# Patient Record
Sex: Female | Born: 1985 | Race: White | Hispanic: No | Marital: Married | Smoking: Current some day smoker
Health system: Southern US, Community
[De-identification: ages and names within clinical notes are randomized; demographics above are authoritative.]

## PROBLEM LIST (undated history)

## (undated) DIAGNOSIS — M810 Age-related osteoporosis without current pathological fracture: Secondary | ICD-10-CM

## (undated) DIAGNOSIS — F319 Bipolar disorder, unspecified: Secondary | ICD-10-CM

## (undated) DIAGNOSIS — M1990 Unspecified osteoarthritis, unspecified site: Secondary | ICD-10-CM

## (undated) DIAGNOSIS — F4312 Post-traumatic stress disorder, chronic: Secondary | ICD-10-CM

## (undated) DIAGNOSIS — F419 Anxiety disorder, unspecified: Secondary | ICD-10-CM

## (undated) DIAGNOSIS — G43909 Migraine, unspecified, not intractable, without status migrainosus: Secondary | ICD-10-CM

## (undated) DIAGNOSIS — F32A Depression, unspecified: Secondary | ICD-10-CM

## (undated) DIAGNOSIS — F988 Other specified behavioral and emotional disorders with onset usually occurring in childhood and adolescence: Secondary | ICD-10-CM

## (undated) DIAGNOSIS — T7840XA Allergy, unspecified, initial encounter: Secondary | ICD-10-CM

## (undated) HISTORY — DX: Depression, unspecified: F32.A

## (undated) HISTORY — DX: Age-related osteoporosis without current pathological fracture: M81.0

## (undated) HISTORY — DX: Anxiety disorder, unspecified: F41.9

## (undated) HISTORY — PX: MOUTH SURGERY: SHX715

## (undated) HISTORY — DX: Migraine, unspecified, not intractable, without status migrainosus: G43.909

## (undated) HISTORY — DX: Allergy, unspecified, initial encounter: T78.40XA

## (undated) HISTORY — DX: Unspecified osteoarthritis, unspecified site: M19.90

## (undated) HISTORY — DX: Bipolar disorder, unspecified: F31.9

## (undated) HISTORY — DX: Other specified behavioral and emotional disorders with onset usually occurring in childhood and adolescence: F98.8

## (undated) HISTORY — DX: Post-traumatic stress disorder, chronic: F43.12

---
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
## 2005-03-28 ENCOUNTER — Emergency Department: Payer: Self-pay | Admitting: Emergency Medicine

## 2006-02-17 ENCOUNTER — Ambulatory Visit: Payer: Self-pay | Admitting: Obstetrics and Gynecology

## 2006-02-18 ENCOUNTER — Ambulatory Visit: Payer: Self-pay | Admitting: Obstetrics and Gynecology

## 2006-03-13 ENCOUNTER — Emergency Department: Payer: Self-pay | Admitting: Internal Medicine

## 2006-04-27 ENCOUNTER — Ambulatory Visit: Payer: Self-pay | Admitting: Obstetrics and Gynecology

## 2006-07-04 ENCOUNTER — Ambulatory Visit: Payer: Self-pay | Admitting: Obstetrics and Gynecology

## 2006-08-18 ENCOUNTER — Ambulatory Visit: Payer: Self-pay | Admitting: Obstetrics and Gynecology

## 2006-09-01 ENCOUNTER — Ambulatory Visit: Payer: Self-pay | Admitting: Obstetrics and Gynecology

## 2006-09-08 ENCOUNTER — Ambulatory Visit: Payer: Self-pay | Admitting: Obstetrics and Gynecology

## 2006-09-11 ENCOUNTER — Observation Stay: Payer: Self-pay | Admitting: Obstetrics and Gynecology

## 2006-09-14 ENCOUNTER — Observation Stay: Payer: Self-pay | Admitting: Obstetrics and Gynecology

## 2006-09-15 ENCOUNTER — Ambulatory Visit: Payer: Self-pay | Admitting: Obstetrics and Gynecology

## 2006-09-19 ENCOUNTER — Observation Stay: Payer: Self-pay | Admitting: Obstetrics and Gynecology

## 2006-09-22 ENCOUNTER — Inpatient Hospital Stay: Payer: Self-pay | Admitting: Obstetrics and Gynecology

## 2007-01-29 ENCOUNTER — Emergency Department: Payer: Self-pay | Admitting: Emergency Medicine

## 2007-04-04 ENCOUNTER — Emergency Department: Payer: Self-pay | Admitting: Unknown Physician Specialty

## 2007-04-20 ENCOUNTER — Ambulatory Visit: Payer: Self-pay | Admitting: Urology

## 2007-04-26 ENCOUNTER — Ambulatory Visit: Payer: Self-pay | Admitting: Obstetrics and Gynecology

## 2007-05-13 ENCOUNTER — Emergency Department: Payer: Self-pay | Admitting: Emergency Medicine

## 2008-01-19 ENCOUNTER — Ambulatory Visit: Payer: Self-pay

## 2008-03-15 ENCOUNTER — Ambulatory Visit: Payer: Self-pay | Admitting: Family Medicine

## 2008-03-19 ENCOUNTER — Emergency Department: Payer: Self-pay | Admitting: Emergency Medicine

## 2008-08-07 ENCOUNTER — Ambulatory Visit: Payer: Self-pay

## 2008-08-12 ENCOUNTER — Ambulatory Visit: Payer: Self-pay

## 2009-01-03 ENCOUNTER — Emergency Department: Payer: Self-pay | Admitting: Emergency Medicine

## 2009-02-28 ENCOUNTER — Emergency Department: Payer: Self-pay | Admitting: Emergency Medicine

## 2009-03-05 ENCOUNTER — Ambulatory Visit: Payer: Self-pay | Admitting: Internal Medicine

## 2009-04-24 ENCOUNTER — Inpatient Hospital Stay: Payer: Self-pay | Admitting: Psychiatry

## 2009-05-20 ENCOUNTER — Other Ambulatory Visit: Payer: Self-pay | Admitting: Psychiatry

## 2009-05-26 ENCOUNTER — Other Ambulatory Visit: Payer: Self-pay | Admitting: Psychiatry

## 2009-06-05 ENCOUNTER — Inpatient Hospital Stay: Payer: Self-pay | Admitting: Psychiatry

## 2009-07-21 ENCOUNTER — Encounter: Payer: Self-pay | Admitting: Family Medicine

## 2009-07-25 ENCOUNTER — Other Ambulatory Visit: Payer: Self-pay | Admitting: Psychiatry

## 2009-08-05 ENCOUNTER — Ambulatory Visit: Payer: Self-pay | Admitting: Family Medicine

## 2009-08-12 ENCOUNTER — Other Ambulatory Visit: Payer: Self-pay | Admitting: Family Medicine

## 2009-08-15 ENCOUNTER — Encounter: Payer: Self-pay | Admitting: Family Medicine

## 2009-08-28 ENCOUNTER — Other Ambulatory Visit: Payer: Self-pay | Admitting: Family Medicine

## 2009-08-28 ENCOUNTER — Other Ambulatory Visit: Payer: Self-pay | Admitting: Psychiatry

## 2009-09-03 ENCOUNTER — Emergency Department: Payer: Self-pay | Admitting: Emergency Medicine

## 2009-09-12 ENCOUNTER — Other Ambulatory Visit: Payer: Self-pay

## 2009-11-24 ENCOUNTER — Other Ambulatory Visit: Payer: Self-pay | Admitting: Family Medicine

## 2010-01-12 ENCOUNTER — Emergency Department: Payer: Self-pay | Admitting: Emergency Medicine

## 2010-01-21 ENCOUNTER — Emergency Department: Payer: Self-pay | Admitting: Emergency Medicine

## 2010-03-12 ENCOUNTER — Encounter: Payer: Self-pay | Admitting: General Practice

## 2010-03-17 ENCOUNTER — Encounter: Payer: Self-pay | Admitting: General Practice

## 2010-03-19 ENCOUNTER — Ambulatory Visit: Payer: Self-pay | Admitting: General Practice

## 2010-04-16 ENCOUNTER — Encounter: Payer: Self-pay | Admitting: General Practice

## 2010-08-06 ENCOUNTER — Encounter: Payer: Self-pay | Admitting: General Practice

## 2010-08-16 ENCOUNTER — Encounter: Payer: Self-pay | Admitting: General Practice

## 2010-09-15 ENCOUNTER — Encounter: Payer: Self-pay | Admitting: General Practice

## 2011-04-01 ENCOUNTER — Emergency Department: Payer: Self-pay | Admitting: *Deleted

## 2011-04-06 ENCOUNTER — Ambulatory Visit: Payer: Self-pay | Admitting: General Practice

## 2011-04-21 ENCOUNTER — Encounter: Payer: Self-pay | Admitting: Specialist

## 2011-04-27 ENCOUNTER — Other Ambulatory Visit: Payer: Self-pay | Admitting: Psychiatry

## 2011-05-18 ENCOUNTER — Encounter: Payer: Self-pay | Admitting: Specialist

## 2011-06-18 ENCOUNTER — Encounter: Payer: Self-pay | Admitting: Specialist

## 2011-11-02 ENCOUNTER — Ambulatory Visit: Payer: Self-pay | Admitting: Family Medicine

## 2011-11-02 LAB — HCG, QUANTITATIVE, PREGNANCY: Beta Hcg, Quant.: <1 m[IU]/mL — ABNORMAL LOW

## 2013-02-07 ENCOUNTER — Ambulatory Visit: Payer: Self-pay | Admitting: Family Medicine

## 2013-04-19 ENCOUNTER — Emergency Department: Payer: Self-pay | Admitting: Internal Medicine

## 2014-10-23 DIAGNOSIS — R0602 Shortness of breath: Secondary | ICD-10-CM | POA: Insufficient documentation

## 2014-10-23 DIAGNOSIS — F419 Anxiety disorder, unspecified: Secondary | ICD-10-CM | POA: Insufficient documentation

## 2014-10-23 DIAGNOSIS — F319 Bipolar disorder, unspecified: Secondary | ICD-10-CM | POA: Insufficient documentation

## 2014-10-23 DIAGNOSIS — F4312 Post-traumatic stress disorder, chronic: Secondary | ICD-10-CM | POA: Insufficient documentation

## 2014-10-23 DIAGNOSIS — Z87898 Personal history of other specified conditions: Secondary | ICD-10-CM | POA: Insufficient documentation

## 2014-10-23 DIAGNOSIS — F17200 Nicotine dependence, unspecified, uncomplicated: Secondary | ICD-10-CM | POA: Insufficient documentation

## 2014-10-23 DIAGNOSIS — J309 Allergic rhinitis, unspecified: Secondary | ICD-10-CM | POA: Insufficient documentation

## 2014-10-23 DIAGNOSIS — G43909 Migraine, unspecified, not intractable, without status migrainosus: Secondary | ICD-10-CM | POA: Insufficient documentation

## 2014-10-24 ENCOUNTER — Ambulatory Visit (INDEPENDENT_AMBULATORY_CARE_PROVIDER_SITE_OTHER): Payer: 59 | Admitting: Family Medicine

## 2014-10-24 ENCOUNTER — Encounter: Payer: Self-pay | Admitting: Family Medicine

## 2014-10-24 VITALS — BP 124/78 | HR 119 | Temp 98.9°F | Resp 16 | Ht 66.5 in | Wt 162.8 lb

## 2014-10-24 DIAGNOSIS — J301 Allergic rhinitis due to pollen: Secondary | ICD-10-CM

## 2014-10-24 DIAGNOSIS — J069 Acute upper respiratory infection, unspecified: Secondary | ICD-10-CM | POA: Diagnosis not present

## 2014-10-24 MED ORDER — AZITHROMYCIN 250 MG PO TABS: ORAL_TABLET | ORAL | Status: DC

## 2014-10-24 NOTE — Patient Instructions (Addendum)
Add Mucinex and continue Delsym. Resume Flonase for allergy symptoms

## 2014-10-24 NOTE — Progress Notes (Signed)
Subjective:     Patient ID: Ashley Suarez, female   DOB: [birth year], 29 y.o.   MRN: [id number]  HPI Chief Complaint  Patient presents with  . Cough    patient comes in office today with concerns and complaints of cough, sore throat and post nasal drainage. Patient states that she has been exposed to strep by family memer and is requesting strep test today.   Reports that sore throat and itchy eyes have present for a week but cough has persisted since being seen for a URI in May. Has been taking Claritin and otc cough medication for her sx.  Review of Systems  HENT: Positive for congestion (clear drainage).   Respiratory: Positive for cough (sputum is described as off white).        Objective:   Physical Exam Ears: T.M's intact without inflammation Throat: no tonsillar enlargement or exudate Neck: no cervical adenopathy Lungs: coarse basilar crackles     Assessment:     1. Upper respiratory infection (suspect bronchitis)  - POCT rapid strep A - azithromycin (ZITHROMAX) 250 MG tablet; 2 pills the first day then one pill daily  Dispense: 6 tablet; Refill: 0  2. Allergic rhinitis due to pollen     Plan:     Resume Flonase spray she has at home and continue current otc medication.

## 2014-12-23 ENCOUNTER — Other Ambulatory Visit: Payer: Self-pay | Admitting: Family Medicine

## 2014-12-23 DIAGNOSIS — F419 Anxiety disorder, unspecified: Secondary | ICD-10-CM

## 2014-12-23 MED ORDER — ALPRAZOLAM 1 MG PO TABS: ORAL_TABLET | ORAL | Status: DC

## 2014-12-23 NOTE — Telephone Encounter (Signed)
ALPRAZolam Duanne Moron) 1 MG tablet [facility] pharmacy  Thanks Con Memos

## 2014-12-23 NOTE — Telephone Encounter (Signed)
Ok to call in refill. Thanks.

## 2015-01-27 ENCOUNTER — Other Ambulatory Visit: Payer: Self-pay | Admitting: Family Medicine

## 2015-01-27 DIAGNOSIS — F419 Anxiety disorder, unspecified: Secondary | ICD-10-CM

## 2015-01-27 MED ORDER — ALPRAZOLAM 1 MG PO TABS: ORAL_TABLET | ORAL | Status: DC

## 2015-01-27 NOTE — Telephone Encounter (Signed)
Patient's last refill was on 12/23/14 #90 with 0 RF. Patient's LOV was 10/24/14.

## 2015-01-27 NOTE — Telephone Encounter (Signed)
Prescription called into pharmacy. sd

## 2015-01-27 NOTE — Telephone Encounter (Signed)
Pt contacted office for refill request on the following medications:  ALPRAZolam (XANAX) 1 MG tablet.  [facility].  TR#[phone]/[id number]

## 2015-01-27 NOTE — Telephone Encounter (Signed)
Please verify and pull down appropriate refill. Thanks.

## 2015-01-27 NOTE — Telephone Encounter (Signed)
See refill request.

## 2015-01-27 NOTE — Telephone Encounter (Signed)
Please review. Thanks!  

## 2015-01-27 NOTE — Telephone Encounter (Signed)
Ok to call in rx.  Thanks.  

## 2015-02-11 ENCOUNTER — Encounter: Payer: Self-pay | Admitting: Family Medicine

## 2015-02-11 ENCOUNTER — Ambulatory Visit (INDEPENDENT_AMBULATORY_CARE_PROVIDER_SITE_OTHER): Payer: 59 | Admitting: Family Medicine

## 2015-02-11 VITALS — BP 132/82 | HR 120 | Temp 98.2°F | Resp 20 | Ht 66.0 in | Wt 163.0 lb

## 2015-02-11 DIAGNOSIS — J111 Influenza due to unidentified influenza virus with other respiratory manifestations: Secondary | ICD-10-CM | POA: Diagnosis not present

## 2015-02-11 DIAGNOSIS — R05 Cough: Secondary | ICD-10-CM | POA: Diagnosis not present

## 2015-02-11 DIAGNOSIS — R059 Cough, unspecified: Secondary | ICD-10-CM

## 2015-02-11 LAB — POCT INFLUENZA A/B
Influenza A, POC: NEGATIVE
Influenza B, POC: NEGATIVE

## 2015-02-11 MED ORDER — OSELTAMIVIR PHOSPHATE 75 MG PO CAPS: 75.0000 mg | ORAL_CAPSULE | Freq: Two times a day (BID) | ORAL | Status: DC

## 2015-02-11 MED ORDER — HYDROCODONE-HOMATROPINE 5-1.5 MG/5ML PO SYRP: 5.0000 mL | ORAL_SOLUTION | Freq: Four times a day (QID) | ORAL | Status: DC | PRN
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
## 2015-10-15 ENCOUNTER — Ambulatory Visit (INDEPENDENT_AMBULATORY_CARE_PROVIDER_SITE_OTHER): Payer: Commercial Managed Care - HMO | Admitting: Family Medicine

## 2015-10-15 ENCOUNTER — Encounter: Payer: Self-pay | Admitting: Family Medicine

## 2015-10-15 VITALS — BP 136/74 | HR 64 | Temp 98.2°F | Resp 16 | Wt 190.0 lb

## 2015-10-15 DIAGNOSIS — J40 Bronchitis, not specified as acute or chronic: Secondary | ICD-10-CM

## 2015-10-15 MED ORDER — CETIRIZINE HCL 10 MG PO TABS: 10.0000 mg | ORAL_TABLET | Freq: Every day | ORAL | Status: DC

## 2015-10-15 MED ORDER — MONTELUKAST SODIUM 10 MG PO TABS: 10.0000 mg | ORAL_TABLET | Freq: Every day | ORAL | Status: DC
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
## 2015-11-13 ENCOUNTER — Emergency Department
Admission: EM | Admit: 2015-11-13 | Discharge: 2015-11-13 | Disposition: A | Discharge status: Home / Self Care | Payer: Commercial Managed Care - HMO | Attending: Emergency Medicine | Admitting: Emergency Medicine

## 2015-11-13 ENCOUNTER — Emergency Department: Payer: Commercial Managed Care - HMO

## 2015-11-13 DIAGNOSIS — F909 Attention-deficit hyperactivity disorder, unspecified type: Secondary | ICD-10-CM | POA: Insufficient documentation

## 2015-11-13 DIAGNOSIS — R202 Paresthesia of skin: Secondary | ICD-10-CM | POA: Diagnosis not present

## 2015-11-13 DIAGNOSIS — F17210 Nicotine dependence, cigarettes, uncomplicated: Secondary | ICD-10-CM | POA: Diagnosis not present

## 2015-11-13 DIAGNOSIS — F319 Bipolar disorder, unspecified: Secondary | ICD-10-CM | POA: Insufficient documentation

## 2015-11-13 DIAGNOSIS — R0602 Shortness of breath: Secondary | ICD-10-CM | POA: Insufficient documentation

## 2015-11-13 DIAGNOSIS — Z79899 Other long term (current) drug therapy: Secondary | ICD-10-CM | POA: Diagnosis not present

## 2015-11-13 DIAGNOSIS — R0600 Dyspnea, unspecified: Secondary | ICD-10-CM

## 2015-11-13 LAB — BASIC METABOLIC PANEL
Anion gap: 13 (ref 5–15)
BUN: 10 mg/dL (ref 6–20)
CHLORIDE: 97 mmol/L — AB (ref 101–111)
CO2: 24 mmol/L (ref 22–32)
CREATININE: 0.74 mg/dL (ref 0.44–1.00)
Calcium: 9.6 mg/dL (ref 8.9–10.3)
GFR calc non Af Amer: >60 mL/min (ref >60)
GLUCOSE: 103 mg/dL — AB (ref 65–99)
Potassium: 3.3 mmol/L — ABNORMAL LOW (ref 3.5–5.1)
Sodium: 134 mmol/L — ABNORMAL LOW (ref 135–145)

## 2015-11-13 LAB — CBC
HCT: 41.6 % (ref 35.0–47.0)
Hemoglobin: 14.7 g/dL (ref 12.0–16.0)
MCH: 33.2 pg (ref 26.0–34.0)
MCHC: 35.2 g/dL (ref 32.0–36.0)
MCV: 94.3 fL (ref 80.0–100.0)
PLATELETS: 243 10*3/uL (ref 150–440)
RBC: 4.41 MIL/uL (ref 3.80–5.20)
RDW: 12 % (ref 11.5–14.5)
WBC: 12.7 10*3/uL — ABNORMAL HIGH (ref 3.6–11.0)

## 2015-11-13 LAB — TROPONIN I
Troponin I: <0.03 ng/mL (ref <0.03)
Troponin I: <0.03 ng/mL (ref <0.03)

## 2015-11-13 MED ORDER — ASPIRIN 81 MG PO CHEW: 81.0000 mg | CHEWABLE_TABLET | Freq: Every day | ORAL | Status: DC

## 2015-11-13 MED ORDER — ASPIRIN 81 MG PO CHEW
324.0000 mg | CHEWABLE_TABLET | Freq: Once | ORAL | Status: AC
  Administered 2015-11-13: 324 mg via ORAL
  Filled 2015-11-13: qty 4

## 2015-11-13 MED ORDER — CARISOPRODOL 350 MG PO TABS: 350.0000 mg | ORAL_TABLET | Freq: Three times a day (TID) | ORAL | Status: DC | PRN

## 2015-11-13 NOTE — ED Notes (Addendum)
Pt arrives to ER via POV c/o tachycardia, hypertension, left sided jaw pain and pain that radiates down left arm accompanied with tingling; pt able to move left arm, sensation intact, color WNL. Pt alert and oriented X4, active, cooperative, pt in NAD. RR even and unlabored, color WNL.  Pt tachypneic.

## 2015-11-13 NOTE — ED Notes (Signed)
Pt in via triage with complaints of left jaw pain radiating down left arm w/ some numbness and tingling down that arm.  Pt denies and chest pain or sob.  Pt reports hx of anxiety but feels like this is different.  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  Pt A/Ox4, in no immediate distress at this time.
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
## 2015-12-05 ENCOUNTER — Ambulatory Visit (INDEPENDENT_AMBULATORY_CARE_PROVIDER_SITE_OTHER): Payer: Commercial Managed Care - HMO | Admitting: Physician Assistant

## 2015-12-05 ENCOUNTER — Encounter: Payer: Self-pay | Admitting: Physician Assistant

## 2015-12-05 VITALS — BP 140/80 | HR 101 | Temp 98.2°F | Resp 16 | Wt 193.2 lb

## 2015-12-05 DIAGNOSIS — F411 Generalized anxiety disorder: Secondary | ICD-10-CM | POA: Diagnosis not present

## 2015-12-05 DIAGNOSIS — E669 Obesity, unspecified: Secondary | ICD-10-CM

## 2015-12-05 DIAGNOSIS — Z713 Dietary counseling and surveillance: Secondary | ICD-10-CM | POA: Diagnosis not present

## 2015-12-05 DIAGNOSIS — Z6831 Body mass index (BMI) 31.0-31.9, adult: Secondary | ICD-10-CM

## 2015-12-05 MED ORDER — ALPRAZOLAM 1 MG PO TABS: 0.5000 mg | ORAL_TABLET | Freq: Three times a day (TID) | ORAL | Status: DC | PRN

## 2015-12-05 MED ORDER — PHENTERMINE HCL 15 MG PO TBDP: 1.0000 | ORAL_TABLET | Freq: Every day | ORAL | Status: DC

## 2015-12-05 NOTE — Patient Instructions (Signed)

## 2015-12-05 NOTE — Progress Notes (Signed)
Patient: Ashley Suarez Female    DOB: 1986-02-11   30 y.o.   MRN: [id number] Visit Date: 12/05/2015  Today's Provider: Mar Daring, PA-C   Chief Complaint  Patient presents with  . Follow-up   Subjective:    HPI  Ashley Suarez is a 30 year old female that comes in today to discuss 2 issues. First, she would like to discuss her stress and anxiety. Secondly, she would like to discuss her weight. Patient used to see Dr. Venia Minks.  Stress: She has been on xanax and seroquel in the past for stress, anxiety, and bipolar. She went off of these medicines on her own because she didn't want chemicals in her body. Her mother recently passed from lung cancer and she has been dealing with stress at work (switching to a new EMR-she works for [employer]) as well as stress from patient calls (she has been having to deal with a lot of patient frustration due to them switching EMRs). These have caused her anxiety to worsen. She was seen in the ER on 6/29 for chest pain, tingling on left side, and SOB. Cardiac work up was negative and it was felt to be anxiety. She has had a full cardiac workup that was negative due to sudden cardiac arrest in her brother in his 107s that he passed from. She does have a strong family history of CAD.   Weight: As she has become more stressed she has noticed she is eating more. She snacks more at work and has put on approx 20 pounds. This also causes her increased stress and anxiety. She has been exercising and trying to cut portions back. She has still not lost any weight.     Allergies  Allergen Reactions  . Hydrocodone-Acetaminophen Nausea And Vomiting  . Tramadol Nausea And Vomiting   Current Meds  Medication Sig  . Multiple Vitamin (MULTI-VITAMINS) TABS Take by mouth.    Review of Systems  Constitutional: Negative.   Respiratory: Negative.   Cardiovascular: Negative.   Gastrointestinal: Negative.   Musculoskeletal: Negative.   Neurological:  Negative.   Psychiatric/Behavioral: Positive for dysphoric mood. Negative for suicidal ideas, sleep disturbance, self-injury, decreased concentration and agitation. The patient is nervous/anxious.     Social History  Substance Use Topics  . Smoking status: Current Some Day Smoker -- 0.50 packs/day    Types: Cigarettes  . Smokeless tobacco: Never Used  . Alcohol Use: No   Objective:   BP 140/80 mmHg  Pulse 101  Temp(Src) 98.2 F (36.8 C) (Oral)  Resp 16  Wt 193 lb 3.2 oz (87.635 kg)  Physical Exam  Constitutional: She appears well-developed and well-nourished. No distress.  Neck: Normal range of motion. Neck supple. No tracheal deviation present. No thyromegaly present.  Cardiovascular: Normal rate, regular rhythm and normal heart sounds.  Exam reveals no gallop and no friction rub.   No murmur heard. Pulmonary/Chest: Effort normal and breath sounds normal. No respiratory distress. She has no wheezes. She has no rales.  Musculoskeletal: She exhibits no edema.  Lymphadenopathy:    She has no cervical adenopathy.  Skin: She is not diaphoretic.  Psychiatric: Her behavior is normal. Judgment and thought content normal. Her mood appears anxious. She exhibits a depressed mood.  Did become tearful at one point in interview and was very nervous about meeting new provider  Vitals reviewed.     Assessment & Plan:     1. GAD (generalized anxiety disorder) Will  refill xanax as below at the dose she had previously tolerated. I will see her back in 4 weeks to recheck and see how she is doing. Once she is stable we will schedule her CPE with pap. - ALPRAZolam (XANAX) 1 MG tablet; Take 0.5-1 tablets (0.5-1 mg total) by mouth 3 (three) times daily as needed for anxiety.  Dispense: 90 tablet; Refill: 0  2. Encounter for weight loss counseling Will start phentermine 15mg  as below due to anxiety and I do not want to make that worse. She is to start trying to do a food diary and limit to 1200  calories per day for a goal of 1-2 pounds of weight lost per week. She needs to drink plenty of water. She is to call if this worsens her anxiety. I will see her back in 4 weeks for a weight recheck. - Phentermine HCl 15 MG TBDP; Take 1 tablet by mouth daily.  Dispense: 30 tablet; Refill: 0  3. BMI 31.0-31.9,adult See above medical treatment plan. - Phentermine HCl 15 MG TBDP; Take 1 tablet by mouth daily.  Dispense: 30 tablet; Refill: 0  4. Obese See above medical treatment plan. - Phentermine HCl 15 MG TBDP; Take 1 tablet by mouth daily.  Dispense: 30 tablet; Refill: 0       Mar Daring, PA-C  [facility]

## 2015-12-07 ENCOUNTER — Encounter: Payer: Self-pay | Admitting: Physician Assistant

## 2015-12-24 ENCOUNTER — Encounter: Payer: Self-pay | Admitting: Physician Assistant

## 2015-12-24 DIAGNOSIS — Z713 Dietary counseling and surveillance: Secondary | ICD-10-CM

## 2015-12-24 DIAGNOSIS — E669 Obesity, unspecified: Secondary | ICD-10-CM

## 2015-12-24 DIAGNOSIS — Z6831 Body mass index (BMI) 31.0-31.9, adult: Secondary | ICD-10-CM

## 2015-12-24 DIAGNOSIS — F411 Generalized anxiety disorder: Secondary | ICD-10-CM

## 2015-12-25 ENCOUNTER — Telehealth: Payer: Self-pay

## 2015-12-25 MED ORDER — PHENTERMINE HCL 15 MG PO TBDP: 1.0000 | ORAL_TABLET | Freq: Every day | ORAL | 0 refills | Status: DC

## 2015-12-25 MED ORDER — ALPRAZOLAM 1 MG PO TABS: 0.5000 mg | ORAL_TABLET | Freq: Three times a day (TID) | ORAL | 0 refills | Status: DC | PRN

## 2015-12-25 NOTE — Telephone Encounter (Signed)
Prescription for Alprazolam 1 MG and Phentermine HCL 15 MG TBDP was called to [place].  Thanks,  -Joseline

## 2015-12-31 ENCOUNTER — Telehealth: Payer: Self-pay | Admitting: Physician Assistant

## 2015-12-31 NOTE — Telephone Encounter (Signed)
Patient advised as directed below. 

## 2015-12-31 NOTE — Telephone Encounter (Signed)
Pt returned your call. ° °ThanksTeri °

## 2015-12-31 NOTE — Telephone Encounter (Signed)
Pt is asking if the Rx for ALPRAZolam (XANAX) 1 MG tablet and Phentermine HCl 15 MG TBDP were called into the pharmacy.  Pt states [facility] on [address] has not rec'd Rx.  CB#[phone] Ext 113/MW

## 2015-12-31 NOTE — Telephone Encounter (Signed)
LMTCB  Thanks,  -Joseline 

## 2015-12-31 NOTE — Telephone Encounter (Signed)
Spoke with patient and advised that Medication were sent to [facility] on 08/10. Per patient if I can please cancel those and call them to [place]. [place] and pharmacy tech I spoke to stated they are able to cancel the Phentermine but not the Xanax because of Insurance. LMTCB.  Thanks,  -Joseline

## 2016-01-07 ENCOUNTER — Encounter: Payer: Self-pay | Admitting: Physician Assistant

## 2016-01-07 ENCOUNTER — Ambulatory Visit (INDEPENDENT_AMBULATORY_CARE_PROVIDER_SITE_OTHER): Payer: 59 | Admitting: Physician Assistant

## 2016-01-07 VITALS — BP 140/90 | HR 93 | Temp 98.1°F | Resp 16 | Wt 190.8 lb

## 2016-01-07 DIAGNOSIS — E669 Obesity, unspecified: Secondary | ICD-10-CM

## 2016-01-07 DIAGNOSIS — Z713 Dietary counseling and surveillance: Secondary | ICD-10-CM | POA: Diagnosis not present

## 2016-01-07 DIAGNOSIS — Z6830 Body mass index (BMI) 30.0-30.9, adult: Secondary | ICD-10-CM | POA: Diagnosis not present

## 2016-01-07 DIAGNOSIS — F419 Anxiety disorder, unspecified: Secondary | ICD-10-CM

## 2016-01-07 MED ORDER — PHENTERMINE HCL 30 MG PO TBDP: 30.0000 mg | ORAL_TABLET | Freq: Every day | ORAL | 0 refills | Status: DC

## 2016-01-07 MED ORDER — ESCITALOPRAM OXALATE 10 MG PO TABS: ORAL_TABLET | ORAL | 1 refills | Status: DC

## 2016-01-07 NOTE — Progress Notes (Signed)
Patient: Ashley Suarez Female    DOB: 29-Mar-1986   30 y.o.   MRN: [id number] Visit Date: 01/07/2016  Today's Provider: Mar Daring, PA-C   Chief Complaint  Patient presents with  . Follow-up    anxiety and weight   Subjective:    HPI Anxiety: Patient is here for her 4 week follow-up Anxiety. She reports that she takes one xanax tablet at 5 am and she takes the other around 12 pm , but like around 9 am she is feeling like her breathing is changing and feels like crying. Patient cannot remember which antidepressant she has been on in the past. The only one she recalls my name is Seroquel and states that she will never take that again because it made her feel crazy.  Weight: Patient is here to follow-up on weight loss counseling. She was put on Phentermine 15 MG.She reports that she is eating healthier. She doesn't drink soda. She reports that she walks 30 minutes everyday in the afternoon, while walking her dog.  Wt Readings from Last 3 Encounters:  01/07/16 190 lb 12.8 oz (86.5 kg)  12/05/15 193 lb 3.2 oz (87.6 kg)  11/13/15 183 lb (83 kg)      Allergies  Allergen Reactions  . Hydrocodone-Acetaminophen Nausea And Vomiting  . Tramadol Nausea And Vomiting   Current Meds  Medication Sig  . ALPRAZolam (XANAX) 1 MG tablet Take 0.5-1 tablets (0.5-1 mg total) by mouth 3 (three) times daily as needed for anxiety.  . Multiple Vitamin (MULTI-VITAMINS) TABS Take by mouth.  . Phentermine HCl 15 MG TBDP Take 1 tablet by mouth daily.    Review of Systems  Constitutional: Negative.        Crying  Respiratory: Negative.   Cardiovascular: Positive for palpitations (when she gets the panic attack). Negative for chest pain and leg swelling.  Gastrointestinal: Negative.   Psychiatric/Behavioral: Negative for self-injury, sleep disturbance and suicidal ideas. The patient is nervous/anxious.     Social History  Substance Use Topics  . Smoking status: Current Some Day Smoker      Packs/day: 0.50    Types: Cigarettes  . Smokeless tobacco: Never Used  . Alcohol use No   Objective:   BP 140/90 (BP Location: Right Arm, Patient Position: Sitting, Cuff Size: Normal)   Pulse 93   Temp 98.1 F (36.7 C) (Oral)   Resp 16   Wt 190 lb 12.8 oz (86.5 kg)   BMI 30.80 kg/m   Physical Exam  Constitutional: She appears well-developed and well-nourished. No distress.  Neck: Normal range of motion. Neck supple. No tracheal deviation present. No thyromegaly present.  Cardiovascular: Normal rate, regular rhythm and normal heart sounds.  Exam reveals no gallop and no friction rub.   No murmur heard. Pulmonary/Chest: Effort normal and breath sounds normal. No respiratory distress. She has no wheezes. She has no rales.  Lymphadenopathy:    She has no cervical adenopathy.  Skin: She is not diaphoretic.  Psychiatric: She has a normal mood and affect. Her behavior is normal. Judgment and thought content normal.  Vitals reviewed.     Assessment & Plan:     1. Anxiety Will add Lexapro as below to see if this offers better control of the anxiety on a daily basis. She does have her Xanax as needed for rescue during acute panic attack. She voiced understanding with how to take the medications. I will see her back in 4 weeks to  see how she is doing with the Lexapro addition. - escitalopram (LEXAPRO) 10 MG tablet; Take 1/2 tab PO q h.s. X 1 week, then increase to 1 tab PO q h.s.  Dispense: 30 tablet; Refill: 1  2. Encounter for weight loss counseling Stable. She continues to do well with weight loss. Will increase phentermine to 30 mg as below. I will see her back in 4 weeks to see how she is tolerating the increase as well as how she is doing with her weight loss. - Phentermine HCl 30 MG TBDP; Take 30 mg by mouth daily with breakfast.  Dispense: 30 tablet; Refill: 0  3. Obese See above medical treatment plan. - Phentermine HCl 30 MG TBDP; Take 30 mg by mouth daily with breakfast.   Dispense: 30 tablet; Refill: 0  4. BMI 30.0-30.9,adult See above medical treatment plan. - Phentermine HCl 30 MG TBDP; Take 30 mg by mouth daily with breakfast.  Dispense: 30 tablet; Refill: 0       Mar Daring, PA-C  [facility]

## 2016-01-07 NOTE — Patient Instructions (Addendum)
Exercising to Lose Weight Exercising can help you to lose weight. In order to lose weight through exercise, you need to do vigorous-intensity exercise. You can tell that you are exercising with vigorous intensity if you are breathing very hard and fast and cannot hold a conversation while exercising. Moderate-intensity exercise helps to maintain your current weight. You can tell that you are exercising at a moderate level if you have a higher heart rate and faster breathing, but you are still able to hold a conversation. HOW OFTEN SHOULD I EXERCISE? Choose an activity that you enjoy and set realistic goals. Your health care provider can help you to make an activity plan that works for you. Exercise regularly as directed by your health care provider. This may include:  Doing resistance training twice each week, such as:  Push-ups.  Sit-ups.  Lifting weights.  Using resistance bands.  Doing a given intensity of exercise for a given amount of time. Choose from these options:  150 minutes of moderate-intensity exercise every week.  75 minutes of vigorous-intensity exercise every week.  A mix of moderate-intensity and vigorous-intensity exercise every week. Children, pregnant women, people who are out of shape, people who are overweight, and older adults may need to consult a health care provider for individual recommendations. If you have any sort of medical condition, be sure to consult your health care provider before starting a new exercise program. WHAT ARE SOME ACTIVITIES THAT CAN HELP ME TO LOSE WEIGHT?   Walking at a rate of at least 4.5 miles an hour.  Jogging or running at a rate of 5 miles per hour.  Biking at a rate of at least 10 miles per hour.  Lap swimming.  Roller-skating or in-line skating.  Cross-country skiing.  Vigorous competitive sports, such as football, basketball, and soccer.  Jumping rope.  Aerobic dancing. HOW CAN I BE MORE ACTIVE IN MY DAY-TO-DAY  ACTIVITIES?  Use the stairs instead of the elevator.  Take a walk during your lunch break.  If you drive, park your car farther away from work or school.  If you take public transportation, get off one stop early and walk the rest of the way.  Make all of your phone calls while standing up and walking around.  Get up, stretch, and walk around every 30 minutes throughout the day. WHAT GUIDELINES SHOULD I FOLLOW WHILE EXERCISING?  Do not exercise so much that you hurt yourself, feel dizzy, or get very short of breath.  Consult your health care provider prior to starting a new exercise program.  Wear comfortable clothes and shoes with good support.  Drink plenty of water while you exercise to prevent dehydration or heat stroke. Body water is lost during exercise and must be replaced.  Work out until you breathe faster and your heart beats faster.   This information is not intended to replace advice given to you by your health care provider. Make sure you discuss any questions you have with your health care provider.   Document Released: 06/05/2010 Document Revised: 05/24/2014 Document Reviewed: 10/04/2013 Elsevier Interactive Patient Education 2016 Elsevier Inc.  Escitalopram tablets What is this medicine? ESCITALOPRAM (es sye TAL oh pram) is used to treat depression and certain types of anxiety. This medicine may be used for other purposes; ask your health care provider or pharmacist if you have questions. What should I tell my health care provider before I take this medicine? They need to know if you have any of these conditions: -bipolar  disorder or a family history of bipolar disorder -diabetes -glaucoma -heart disease -kidney or liver disease -receiving electroconvulsive therapy -seizures (convulsions) -suicidal thoughts, plans, or attempt by you or a family member -an unusual or allergic reaction to escitalopram, the related drug citalopram, other medicines, foods,  dyes, or preservatives -pregnant or trying to become pregnant -breast-feeding How should I use this medicine? Take this medicine by mouth with a glass of water. Follow the directions on the prescription label. You can take it with or without food. If it upsets your stomach, take it with food. Take your medicine at regular intervals. Do not take it more often than directed. Do not stop taking this medicine suddenly except upon the advice of your doctor. Stopping this medicine too quickly may cause serious side effects or your condition may worsen. A special MedGuide will be given to you by the pharmacist with each prescription and refill. Be sure to read this information carefully each time. Talk to your pediatrician regarding the use of this medicine in children. Special care may be needed. Overdosage: If you think you have taken too much of this medicine contact a poison control center or emergency room at once. NOTE: This medicine is only for you. Do not share this medicine with others. What if I miss a dose? If you miss a dose, take it as soon as you can. If it is almost time for your next dose, take only that dose. Do not take double or extra doses. What may interact with this medicine? Do not take this medicine with any of the following medications: -certain medicines for fungal infections like fluconazole, itraconazole, ketoconazole, posaconazole, voriconazole -cisapride -citalopram -dofetilide -dronedarone -linezolid -MAOIs like Carbex, Eldepryl, Marplan, Nardil, and Parnate -methylene blue (injected into a vein) -pimozide -thioridazine -ziprasidone This medicine may also interact with the following medications: -alcohol -aspirin and aspirin-like medicines -carbamazepine -certain medicines for depression, anxiety, or psychotic disturbances -certain medicines for migraine headache like almotriptan, eletriptan, frovatriptan, naratriptan, rizatriptan, sumatriptan,  zolmitriptan -certain medicines for sleep -certain medicines that treat or prevent blood clots like warfarin, enoxaparin, dalteparin -cimetidine -diuretics -fentanyl -furazolidone -isoniazid -lithium -metoprolol -NSAIDs, medicines for pain and inflammation, like ibuprofen or naproxen -other medicines that prolong the QT interval (cause an abnormal heart rhythm) -procarbazine -rasagiline -supplements like St. John's wort, kava kava, valerian -tramadol -tryptophan This list may not describe all possible interactions. Give your health care provider a list of all the medicines, herbs, non-prescription drugs, or dietary supplements you use. Also tell them if you smoke, drink alcohol, or use illegal drugs. Some items may interact with your medicine. What should I watch for while using this medicine? Tell your doctor if your symptoms do not get better or if they get worse. Visit your doctor or health care professional for regular checks on your progress. Because it may take several weeks to see the full effects of this medicine, it is important to continue your treatment as prescribed by your doctor. Patients and their families should watch out for new or worsening thoughts of suicide or depression. Also watch out for sudden changes in feelings such as feeling anxious, agitated, panicky, irritable, hostile, aggressive, impulsive, severely restless, overly excited and hyperactive, or not being able to sleep. If this happens, especially at the beginning of treatment or after a change in dose, call your health care professional. Dennis Bast may get drowsy or dizzy. Do not drive, use machinery, or do anything that needs mental alertness until you know how this medicine affects  you. Do not stand or sit up quickly, especially if you are an older patient. This reduces the risk of dizzy or fainting spells. Alcohol may interfere with the effect of this medicine. Avoid alcoholic drinks. Your mouth may get dry. Chewing  sugarless gum or sucking hard candy, and drinking plenty of water may help. Contact your doctor if the problem does not go away or is severe. What side effects may I notice from receiving this medicine? Side effects that you should report to your doctor or health care professional as soon as possible: -allergic reactions like skin rash, itching or hives, swelling of the face, lips, or tongue -confusion -feeling faint or lightheaded, falls -fast talking and excited feelings or actions that are out of control -hallucination, loss of contact with reality -seizures -suicidal thoughts or other mood changes -unusual bleeding or bruising Side effects that usually do not require medical attention (report to your doctor or health care professional if they continue or are bothersome): -blurred vision -changes in appetite -change in sex drive or performance -headache -increased sweating -nausea This list may not describe all possible side effects. Call your doctor for medical advice about side effects. You may report side effects to FDA at 1-800-FDA-1088. Where should I keep my medicine? Keep out of reach of children. Store at room temperature between 15 and 30 degrees C (59 and 86 degrees F). Throw away any unused medicine after the expiration date. NOTE: This sheet is a summary. It may not cover all possible information. If you have questions about this medicine, talk to your doctor, pharmacist, or health care provider.    2016, Elsevier/Gold Standard. (2012-11-28 12:32:55)

## 2016-01-22 ENCOUNTER — Ambulatory Visit: Payer: Commercial Managed Care - HMO | Admitting: Physician Assistant

## 2016-01-28 ENCOUNTER — Other Ambulatory Visit: Payer: Self-pay | Admitting: Physician Assistant

## 2016-01-28 ENCOUNTER — Encounter: Payer: Self-pay | Admitting: Physician Assistant

## 2016-01-28 DIAGNOSIS — F411 Generalized anxiety disorder: Secondary | ICD-10-CM

## 2016-01-29 MED ORDER — ALPRAZOLAM 1 MG PO TABS: 0.5000 mg | ORAL_TABLET | Freq: Three times a day (TID) | ORAL | 1 refills | Status: DC | PRN

## 2016-01-29 NOTE — Telephone Encounter (Signed)
Please review-aa 

## 2016-01-29 NOTE — Telephone Encounter (Signed)
RX called in-aa 

## 2016-02-17 ENCOUNTER — Encounter: Payer: Self-pay | Admitting: Physician Assistant

## 2016-02-17 ENCOUNTER — Ambulatory Visit (INDEPENDENT_AMBULATORY_CARE_PROVIDER_SITE_OTHER): Payer: 59 | Admitting: Physician Assistant

## 2016-02-17 VITALS — BP 128/80 | HR 92 | Temp 98.4°F | Resp 16 | Wt 187.0 lb

## 2016-02-17 DIAGNOSIS — J029 Acute pharyngitis, unspecified: Secondary | ICD-10-CM

## 2016-02-17 LAB — POCT RAPID STREP A (OFFICE): Rapid Strep A Screen: NEGATIVE

## 2016-02-17 MED ORDER — AMOXICILLIN 500 MG PO CAPS: 500.0000 mg | ORAL_CAPSULE | Freq: Two times a day (BID) | ORAL | 0 refills | Status: AC

## 2016-02-17 NOTE — Patient Instructions (Signed)

## 2016-02-17 NOTE — Progress Notes (Signed)
Patient: Ashley Suarez Female    DOB: November 14, 1985   30 y.o.   MRN: [id number] Visit Date: 02/17/2016  Today's Provider: Trinna Post, PA-C   Chief Complaint  Patient presents with  . Sore Throat    Son has strep throat also   Subjective:    Sore Throat   This is a new problem. The current episode started today. The problem has been gradually worsening. Neither side of throat is experiencing more pain than the other. Associated symptoms include coughing. Pertinent negatives include no congestion, drooling, ear discharge, ear pain, headaches, shortness of breath, stridor or trouble swallowing. She has had exposure to strep.   The patient reports her son went to the doctor today with a 103 fever and was diagnosed with strep.     Allergies  Allergen Reactions  . Hydrocodone-Acetaminophen Nausea And Vomiting  . Tramadol Nausea And Vomiting     Current Outpatient Prescriptions:  .  ALPRAZolam (XANAX) 1 MG tablet, Take 0.5-1 tablets (0.5-1 mg total) by mouth 3 (three) times daily as needed for anxiety., Disp: 90 tablet, Rfl: 1 .  aspirin (ASPIRIN CHILDRENS) 81 MG chewable tablet, Chew 1 tablet (81 mg total) by mouth daily., Disp: 36 tablet, Rfl: 0 .  cetirizine (ZYRTEC) 10 MG tablet, Take 1 tablet (10 mg total) by mouth daily., Disp: 30 tablet, Rfl: 11 .  montelukast (SINGULAIR) 10 MG tablet, Take 1 tablet (10 mg total) by mouth at bedtime., Disp: 30 tablet, Rfl: 3 .  Multiple Vitamin (MULTI-VITAMINS) TABS, Take by mouth., Disp: , Rfl:  .  Phentermine HCl 30 MG TBDP, Take 30 mg by mouth daily with breakfast., Disp: 30 tablet, Rfl: 0 .  amoxicillin (AMOXIL) 500 MG capsule, Take 1 capsule (500 mg total) by mouth 2 (two) times daily., Disp: 20 capsule, Rfl: 0 .  escitalopram (LEXAPRO) 10 MG tablet, Take 1/2 tab PO q h.s. X 1 week, then increase to 1 tab PO q h.s. (Patient not taking: Reported on 02/17/2016), Disp: 30 tablet, Rfl: 1  Review of Systems  Constitutional: Positive  for fatigue. Negative for activity change, appetite change, fever and unexpected weight change.  HENT: Positive for sore throat. Negative for congestion, dental problem, drooling, ear discharge, ear pain, facial swelling, hearing loss, mouth sores, nosebleeds, postnasal drip, rhinorrhea, sinus pressure, tinnitus, trouble swallowing and voice change.   Respiratory: Positive for cough. Negative for apnea, choking, chest tightness, shortness of breath, wheezing and stridor.   Cardiovascular: Negative.   Gastrointestinal: Negative.   Neurological: Negative for dizziness, light-headedness and headaches.    Social History  Substance Use Topics  . Smoking status: Current Some Day Smoker    Packs/day: 0.50    Types: Cigarettes  . Smokeless tobacco: Never Used  . Alcohol use No   Objective:   BP 128/80 (BP Location: Left Arm, Patient Position: Sitting, Cuff Size: Large)   Pulse 92   Temp 98.4 F (36.9 C) (Oral)   Resp 16   Wt 187 lb (84.8 kg)   BMI 30.18 kg/m   Physical Exam  Constitutional: She is oriented to person, place, and time. She appears well-developed and well-nourished. No distress.  HENT:  Mouth/Throat: Oropharyngeal exudate, posterior oropharyngeal edema and posterior oropharyngeal erythema present. No tonsillar abscesses.  Cardiovascular: Normal rate and regular rhythm.   Pulmonary/Chest: Effort normal and breath sounds normal.  Lymphadenopathy:    She has no cervical adenopathy.  Neurological: She is alert and oriented to person,  place, and time.  Skin: Skin is warm and dry.        Assessment & Plan:     Sore throat - Plan: POCT rapid strep A, amoxicillin (AMOXIL) 500 MG capsule  Rapid strep test in office was negative but patient has obvious oropharyngeal exudate as well as source of exposure. Will treat with 10 days amoxicillin. Patient to come back if not feeling better. Work note provided.   Return if symptoms worsen or fail to improve.   Patient  Instructions  Strep Throat Strep throat is a bacterial infection of the throat. Your health care provider may call the infection tonsillitis or pharyngitis, depending on whether there is swelling in the tonsils or at the back of the throat. Strep throat is most common during the cold months of the year in children who are 29-15 years of age, but it can happen during any season in people of any age. This infection is spread from person to person (contagious) through coughing, sneezing, or close contact. CAUSES Strep throat is caused by the bacteria called Streptococcus pyogenes. RISK FACTORS This condition is more likely to develop in:  People who spend time in crowded places where the infection can spread easily.  People who have close contact with someone who has strep throat. SYMPTOMS Symptoms of this condition include:  Fever or chills.   Redness, swelling, or pain in the tonsils or throat.  Pain or difficulty when swallowing.  White or yellow spots on the tonsils or throat.  Swollen, tender glands in the neck or under the jaw.  Red rash all over the body (rare). DIAGNOSIS This condition is diagnosed by performing a rapid strep test or by taking a swab of your throat (throat culture test). Results from a rapid strep test are usually ready in a few minutes, but throat culture test results are available after one or two days. TREATMENT This condition is treated with antibiotic medicine. HOME CARE INSTRUCTIONS Medicines  Take over-the-counter and prescription medicines only as told by your health care provider.  Take your antibiotic as told by your health care provider. Do not stop taking the antibiotic even if you start to feel better.  Have family members who also have a sore throat or fever tested for strep throat. They may need antibiotics if they have the strep infection. Eating and Drinking  Do not share food, drinking cups, or personal items that could cause the  infection to spread to other people.  If swallowing is difficult, try eating soft foods until your sore throat feels better.  Drink enough fluid to keep your urine clear or pale yellow. General Instructions  Gargle with a salt-water mixture 3-4 times per day or as needed. To make a salt-water mixture, completely dissolve -1 tsp of salt in 1 cup of warm water.  Make sure that all household members wash their hands well.  Get plenty of rest.  Stay home from school or work until you have been taking antibiotics for 24 hours.  Keep all follow-up visits as told by your health care provider. This is important. SEEK MEDICAL CARE IF:  The glands in your neck continue to get bigger.  You develop a rash, cough, or earache.  You cough up a thick liquid that is green, yellow-brown, or bloody.  You have pain or discomfort that does not get better with medicine.  Your problems seem to be getting worse rather than better.  You have a fever. SEEK IMMEDIATE MEDICAL CARE IF:  You have new symptoms, such as vomiting, severe headache, stiff or painful neck, chest pain, or shortness of breath.  You have severe throat pain, drooling, or changes in your voice.  You have swelling of the neck, or the skin on the neck becomes red and tender.  You have signs of dehydration, such as fatigue, dry mouth, and decreased urination.  You become increasingly sleepy, or you cannot wake up completely.  Your joints become red or painful.   This information is not intended to replace advice given to you by your health care provider. Make sure you discuss any questions you have with your health care provider.   Document Released: 04/30/2000 Document Revised: 01/22/2015 Document Reviewed: 08/26/2014 Elsevier Interactive Patient Education 2016 [place]

## 2016-02-18 ENCOUNTER — Encounter: Payer: Self-pay | Admitting: Physician Assistant

## 2016-02-26 ENCOUNTER — Other Ambulatory Visit: Payer: Self-pay | Admitting: Physician Assistant

## 2016-02-26 DIAGNOSIS — E669 Obesity, unspecified: Secondary | ICD-10-CM

## 2016-02-26 DIAGNOSIS — Z6830 Body mass index (BMI) 30.0-30.9, adult: Secondary | ICD-10-CM

## 2016-02-26 DIAGNOSIS — Z713 Dietary counseling and surveillance: Secondary | ICD-10-CM

## 2016-02-27 ENCOUNTER — Other Ambulatory Visit: Payer: Self-pay | Admitting: Physician Assistant

## 2016-02-27 DIAGNOSIS — Z6830 Body mass index (BMI) 30.0-30.9, adult: Secondary | ICD-10-CM

## 2016-02-27 DIAGNOSIS — E669 Obesity, unspecified: Secondary | ICD-10-CM

## 2016-02-27 DIAGNOSIS — Z713 Dietary counseling and surveillance: Secondary | ICD-10-CM

## 2016-02-27 MED ORDER — PHENTERMINE HCL 30 MG PO TBDP: 30.0000 mg | ORAL_TABLET | Freq: Every day | ORAL | 0 refills | Status: DC

## 2016-02-27 NOTE — Telephone Encounter (Signed)
Prescription called into [facility].

## 2016-03-01 ENCOUNTER — Encounter: Payer: Self-pay | Admitting: Physician Assistant

## 2016-03-02 ENCOUNTER — Other Ambulatory Visit: Payer: Self-pay | Admitting: Physician Assistant

## 2016-03-02 DIAGNOSIS — F411 Generalized anxiety disorder: Secondary | ICD-10-CM

## 2016-03-03 NOTE — Telephone Encounter (Signed)
Prescription phoned in to the pharmacy.

## 2016-03-11 ENCOUNTER — Ambulatory Visit: Payer: 59 | Admitting: Physician Assistant

## 2016-03-24 ENCOUNTER — Encounter: Payer: Self-pay | Admitting: Physician Assistant

## 2016-03-24 ENCOUNTER — Ambulatory Visit (INDEPENDENT_AMBULATORY_CARE_PROVIDER_SITE_OTHER): Payer: 59 | Admitting: Physician Assistant

## 2016-03-24 VITALS — BP 126/80 | HR 72 | Temp 98.6°F | Resp 16 | Ht 65.0 in | Wt 188.0 lb

## 2016-03-24 DIAGNOSIS — Z13220 Encounter for screening for lipoid disorders: Secondary | ICD-10-CM | POA: Diagnosis not present

## 2016-03-24 DIAGNOSIS — E6609 Other obesity due to excess calories: Secondary | ICD-10-CM | POA: Diagnosis not present

## 2016-03-24 DIAGNOSIS — Z6831 Body mass index (BMI) 31.0-31.9, adult: Secondary | ICD-10-CM

## 2016-03-24 DIAGNOSIS — F411 Generalized anxiety disorder: Secondary | ICD-10-CM | POA: Diagnosis not present

## 2016-03-24 DIAGNOSIS — Z713 Dietary counseling and surveillance: Secondary | ICD-10-CM | POA: Diagnosis not present

## 2016-03-24 DIAGNOSIS — Z136 Encounter for screening for cardiovascular disorders: Secondary | ICD-10-CM | POA: Diagnosis not present

## 2016-03-24 MED ORDER — PHENTERMINE HCL 37.5 MG PO TABS: 37.5000 mg | ORAL_TABLET | Freq: Every day | ORAL | 3 refills | Status: DC

## 2016-03-24 MED ORDER — ALPRAZOLAM 1 MG PO TABS: ORAL_TABLET | ORAL | 3 refills | Status: DC

## 2016-03-24 NOTE — Progress Notes (Signed)
Patient: Ashley Suarez Female    DOB: 01/12/1986   30 y.o.   MRN: [id number] Visit Date: 03/24/2016  Today's Provider: Mar Daring, PA-C   Chief Complaint  Patient presents with  . Follow-up  . Anxiety   Subjective:    HPI  Follow up for weight loss  The patient was last seen for this 2 months ago. Changes made at last visit include no changes.  She reports excellent compliance with treatment. She feels that condition is Unchanged. She is not having side effects.  She has lost 3 pounds since last weight check.   ------------------------------------------------------------------------------------   Follow up for Anxiety  The patient was last seen for this 2 months ago. Changes made at last visit include added Lexapro.  She reports excellent compliance with treatment. She feels that condition is Improved. She is not having side effects. She discontinued lexapro on her own, stating it made her too sleepy.  ------------------------------------------------------------------------------------     Allergies  Allergen Reactions  . Hydrocodone-Acetaminophen Nausea And Vomiting  . Tramadol Nausea And Vomiting     Current Outpatient Prescriptions:  .  ALPRAZolam (XANAX) 1 MG tablet, TAKE 1/2 TO 1 TABLET BY MOUTH 3 TIMES DAILY AS NEEDED, Disp: 90 tablet, Rfl: 0 .  cetirizine (ZYRTEC) 10 MG tablet, Take 1 tablet (10 mg total) by mouth daily., Disp: 30 tablet, Rfl: 11 .  montelukast (SINGULAIR) 10 MG tablet, Take 1 tablet (10 mg total) by mouth at bedtime., Disp: 30 tablet, Rfl: 3 .  Multiple Vitamin (MULTI-VITAMINS) TABS, Take by mouth., Disp: , Rfl:  .  Phentermine HCl 30 MG TBDP, Take 30 mg by mouth daily with breakfast., Disp: 30 tablet, Rfl: 0  Review of Systems  Constitutional: Negative.   HENT: Negative.   Respiratory: Negative.   Cardiovascular: Negative.   Gastrointestinal: Negative.   Psychiatric/Behavioral: Positive for decreased  concentration. Negative for agitation, dysphoric mood, self-injury, sleep disturbance and suicidal ideas. The patient is nervous/anxious.     Social History  Substance Use Topics  . Smoking status: Current Some Day Smoker    Packs/day: 0.50    Types: Cigarettes  . Smokeless tobacco: Never Used  . Alcohol use No   Objective:   BP 126/80 (BP Location: Right Arm, Patient Position: Sitting, Cuff Size: Large)   Pulse 72   Temp 98.6 F (37 C) (Oral)   Resp 16   Ht 5\' 5"  (1.651 m)   Wt 188 lb (85.3 kg)   BMI 31.28 kg/m   Physical Exam  Constitutional: She appears well-developed and well-nourished. No distress.  Neck: Normal range of motion. Neck supple.  Cardiovascular: Normal rate, regular rhythm and normal heart sounds.  Exam reveals no gallop and no friction rub.   No murmur heard. Pulmonary/Chest: Effort normal and breath sounds normal. No respiratory distress. She has no wheezes. She has no rales.  Skin: She is not diaphoretic.  Psychiatric: She has a normal mood and affect. Her behavior is normal. Judgment and thought content normal.  Vitals reviewed.     Assessment & Plan:     1. Encounter for weight loss counseling She has tolerated the phentermine well and has done well on the 30 mg. I will increase her to the 37.5 mg to see if we get any added benefit. Continue daily walking. Continue lower calorie diet. I'll see her back in 3 months to recheck her progress. - phentermine (ADIPEX-P) 37.5 MG tablet; Take 1 tablet (37.5 mg  total) by mouth daily before breakfast.  Dispense: 30 tablet; Refill: 3  2. Class 1 obesity due to excess calories without serious comorbidity with body mass index (BMI) of 31.0 to 31.9 in adult See above medical treatment plan. I'll also check labs as below to make sure there is no other cause for inability to lose weight and the weight gain that she had prior to starting her weight loss journey. - TSH - Comprehensive Metabolic Panel (CMET) -  phentermine (ADIPEX-P) 37.5 MG tablet; Take 1 tablet (37.5 mg total) by mouth daily before breakfast.  Dispense: 30 tablet; Refill: 3  3. GAD (generalized anxiety disorder) Stable. Diagnosis pulled for medication refill. Continue current medical treatment plan. - ALPRAZolam (XANAX) 1 MG tablet; TAKE 1/2 TO 1 TABLET BY MOUTH 3 TIMES DAILY AS NEEDED  Dispense: 90 tablet; Refill: 3 - CBC w/Diff/Platelet  4. Encounter for lipid screening for cardiovascular disease Will check labs as below and f/u pending results. Family history of high cholesterol in her father. - Lipid Profile       Mar Daring, PA-C  [facility]

## 2016-03-24 NOTE — Patient Instructions (Signed)

## 2016-03-25 ENCOUNTER — Encounter: Payer: Self-pay | Admitting: Physician Assistant

## 2016-03-26 DIAGNOSIS — Z136 Encounter for screening for cardiovascular disorders: Secondary | ICD-10-CM | POA: Diagnosis not present

## 2016-03-26 DIAGNOSIS — Z13220 Encounter for screening for lipoid disorders: Secondary | ICD-10-CM | POA: Diagnosis not present

## 2016-03-26 DIAGNOSIS — F411 Generalized anxiety disorder: Secondary | ICD-10-CM | POA: Diagnosis not present

## 2016-03-26 DIAGNOSIS — Z6831 Body mass index (BMI) 31.0-31.9, adult: Secondary | ICD-10-CM | POA: Diagnosis not present

## 2016-03-26 DIAGNOSIS — E6609 Other obesity due to excess calories: Secondary | ICD-10-CM | POA: Diagnosis not present

## 2016-03-27 LAB — COMPREHENSIVE METABOLIC PANEL
A/G RATIO: 2 (ref 1.2–2.2)
ALT: 22 IU/L (ref 0–32)
AST: 21 IU/L (ref 0–40)
Albumin: 4.5 g/dL (ref 3.5–5.5)
Alkaline Phosphatase: 67 IU/L (ref 39–117)
BILIRUBIN TOTAL: 0.4 mg/dL (ref 0.0–1.2)
BUN/Creatinine Ratio: 10 (ref 9–23)
BUN: 8 mg/dL (ref 6–20)
CALCIUM: 9.7 mg/dL (ref 8.7–10.2)
CHLORIDE: 98 mmol/L (ref 96–106)
CO2: 23 mmol/L (ref 18–29)
Creatinine, Ser: 0.78 mg/dL (ref 0.57–1.00)
GFR calc non Af Amer: 101 mL/min/{1.73_m2} (ref >59)
GFR, EST AFRICAN AMERICAN: 116 mL/min/{1.73_m2} (ref >59)
Globulin, Total: 2.3 g/dL (ref 1.5–4.5)
Glucose: 85 mg/dL (ref 65–99)
Potassium: 4.5 mmol/L (ref 3.5–5.2)
Sodium: 137 mmol/L (ref 134–144)
TOTAL PROTEIN: 6.8 g/dL (ref 6.0–8.5)

## 2016-03-27 LAB — CBC WITH DIFFERENTIAL/PLATELET
BASOS ABS: 0 10*3/uL (ref 0.0–0.2)
Basos: 0 %
EOS (ABSOLUTE): 0.1 10*3/uL (ref 0.0–0.4)
Eos: 2 %
Hematocrit: 43.4 % (ref 34.0–46.6)
Hemoglobin: 14.9 g/dL (ref 11.1–15.9)
IMMATURE GRANS (ABS): 0 10*3/uL (ref 0.0–0.1)
IMMATURE GRANULOCYTES: 0 %
LYMPHS: 27 %
Lymphocytes Absolute: 2.2 10*3/uL (ref 0.7–3.1)
MCH: 32.9 pg (ref 26.6–33.0)
MCHC: 34.3 g/dL (ref 31.5–35.7)
MCV: 96 fL (ref 79–97)
MONOS ABS: 0.4 10*3/uL (ref 0.1–0.9)
Monocytes: 5 %
NEUTROS PCT: 66 %
Neutrophils Absolute: 5.4 10*3/uL (ref 1.4–7.0)
PLATELETS: 282 10*3/uL (ref 150–379)
RBC: 4.53 x10E6/uL (ref 3.77–5.28)
RDW: 12.7 % (ref 12.3–15.4)
WBC: 8.2 10*3/uL (ref 3.4–10.8)

## 2016-03-27 LAB — LIPID PANEL
CHOLESTEROL TOTAL: 174 mg/dL (ref 100–199)
Chol/HDL Ratio: 3.2 ratio units (ref 0.0–4.4)
HDL: 55 mg/dL (ref >39)
LDL Calculated: 95 mg/dL (ref 0–99)
Triglycerides: 121 mg/dL (ref 0–149)
VLDL CHOLESTEROL CAL: 24 mg/dL (ref 5–40)

## 2016-03-27 LAB — TSH: TSH: 3.95 u[IU]/mL (ref 0.450–4.500)

## 2016-03-30 ENCOUNTER — Telehealth: Payer: Self-pay

## 2016-03-30 NOTE — Telephone Encounter (Signed)
LMTCB if any questions or concerns. Patient has mychart also advised that she can see her results there.  Thanks,  -Rebecah Dangerfield

## 2016-03-30 NOTE — Telephone Encounter (Signed)
-----   Message from Mar Daring, [place] sent at 03/30/2016 10:47 AM EST ----- All labs are within normal limits and stable.  Thanks! -JB

## 2016-03-30 NOTE — Telephone Encounter (Signed)
Patient advised as directed below. Patient was also asking for a note stating that she was here 11/08 at 4:30 for her work. Please. She reports that she called the 11/9 and asked for one but did not hear back.  Thanks,  -Mercedes Fort

## 2016-03-31 ENCOUNTER — Encounter: Payer: Self-pay | Admitting: Physician Assistant

## 2016-03-31 NOTE — Telephone Encounter (Signed)
Sent via [employer]. See if patient received please.  Thanks.

## 2016-03-31 NOTE — Telephone Encounter (Signed)
Spoke with patient she states that she is at work and has not check her my chart yet, patient was advised that once she does if she has not received email from provider to give our office a call back. KW

## 2016-04-01 DIAGNOSIS — N898 Other specified noninflammatory disorders of vagina: Secondary | ICD-10-CM | POA: Diagnosis not present

## 2016-04-01 DIAGNOSIS — N9489 Other specified conditions associated with female genital organs and menstrual cycle: Secondary | ICD-10-CM | POA: Diagnosis not present

## 2016-04-07 DIAGNOSIS — Z309 Encounter for contraceptive management, unspecified: Secondary | ICD-10-CM | POA: Diagnosis not present

## 2016-04-07 DIAGNOSIS — Z01419 Encounter for gynecological examination (general) (routine) without abnormal findings: Secondary | ICD-10-CM | POA: Diagnosis not present

## 2016-04-07 DIAGNOSIS — Z1389 Encounter for screening for other disorder: Secondary | ICD-10-CM | POA: Diagnosis not present

## 2016-04-07 LAB — HM PAP SMEAR: HM PAP: NORMAL

## 2016-04-12 DIAGNOSIS — Z124 Encounter for screening for malignant neoplasm of cervix: Secondary | ICD-10-CM | POA: Diagnosis not present

## 2016-04-12 DIAGNOSIS — Z1389 Encounter for screening for other disorder: Secondary | ICD-10-CM | POA: Diagnosis not present

## 2016-04-12 DIAGNOSIS — Z309 Encounter for contraceptive management, unspecified: Secondary | ICD-10-CM | POA: Diagnosis not present

## 2016-04-12 DIAGNOSIS — Z01419 Encounter for gynecological examination (general) (routine) without abnormal findings: Secondary | ICD-10-CM | POA: Diagnosis not present

## 2016-06-03 ENCOUNTER — Encounter: Payer: Self-pay | Admitting: Physician Assistant

## 2016-06-03 DIAGNOSIS — K047 Periapical abscess without sinus: Secondary | ICD-10-CM

## 2016-06-04 MED ORDER — AMOXICILLIN 875 MG PO TABS: 875.0000 mg | ORAL_TABLET | Freq: Two times a day (BID) | ORAL | 0 refills | Status: DC

## 2016-06-04 NOTE — Addendum Note (Signed)
Addended by: Mar Daring on: 06/04/2016 12:13 PM   Modules accepted: Orders

## 2016-06-24 ENCOUNTER — Ambulatory Visit: Payer: 59 | Admitting: Physician Assistant

## 2016-06-30 ENCOUNTER — Encounter: Payer: Self-pay | Admitting: Physician Assistant

## 2016-06-30 ENCOUNTER — Ambulatory Visit (INDEPENDENT_AMBULATORY_CARE_PROVIDER_SITE_OTHER): Payer: 59 | Admitting: Physician Assistant

## 2016-06-30 VITALS — BP 140/90 | HR 85 | Temp 98.1°F | Resp 16 | Ht 66.0 in | Wt 184.0 lb

## 2016-06-30 DIAGNOSIS — J301 Allergic rhinitis due to pollen: Secondary | ICD-10-CM

## 2016-06-30 DIAGNOSIS — J40 Bronchitis, not specified as acute or chronic: Secondary | ICD-10-CM

## 2016-06-30 DIAGNOSIS — E663 Overweight: Secondary | ICD-10-CM

## 2016-06-30 DIAGNOSIS — K047 Periapical abscess without sinus: Secondary | ICD-10-CM | POA: Diagnosis not present

## 2016-06-30 DIAGNOSIS — F411 Generalized anxiety disorder: Secondary | ICD-10-CM | POA: Diagnosis not present

## 2016-06-30 MED ORDER — MONTELUKAST SODIUM 10 MG PO TABS: 10.0000 mg | ORAL_TABLET | Freq: Every day | ORAL | 3 refills | Status: DC

## 2016-06-30 MED ORDER — FLUTICASONE PROPIONATE 50 MCG/ACT NA SUSP: 2.0000 | Freq: Every day | NASAL | 6 refills | Status: DC

## 2016-06-30 MED ORDER — ALPRAZOLAM 1 MG PO TABS: ORAL_TABLET | ORAL | 3 refills | Status: DC

## 2016-06-30 NOTE — Progress Notes (Signed)
Patient: Ashley Suarez Female    DOB: 02/18/1986   31 y.o.   MRN: [id number] Visit Date: 06/30/2016  Today's Provider: Mar Daring, PA-C   Chief Complaint  Patient presents with  . Follow-up   Subjective:    HPI  Follow up for weight loss counseling  The patient was last seen for this 3 months ago. Changes made at last visit include increasing phentermine to 37.5 mg daily.  She reports good compliance with treatment. Patient reports that she stopped taking phentermine in 06/03/2016 due to dental procedures. She feels that condition is Unchanged. She was having side effects. Patient reports that she could feel her pulse increasing in her left side of neck. ------------------------------------------------------------------------------------  Follow up for anxiety  The patient was last seen for this 6 months ago. Changes made at last visit include no changes.  She reports good compliance with treatment. Patient stopped xanax in 06/03/2016 due to dental procedures. She feels that condition is Unchanged. She is not having side effects.  ------------------------------------------------------------------------------------      Allergies  Allergen Reactions  . Hydrocodone-Acetaminophen Nausea And Vomiting  . Tramadol Nausea And Vomiting     Current Outpatient Prescriptions:  Marland Kitchen  Multiple Vitamin (MULTI-VITAMINS) TABS, Take by mouth., Disp: , Rfl:  .  ALPRAZolam (XANAX) 1 MG tablet, TAKE 1/2 TO 1 TABLET BY MOUTH 3 TIMES DAILY AS NEEDED (Patient not taking: Reported on 06/30/2016), Disp: 90 tablet, Rfl: 3 .  cetirizine (ZYRTEC) 10 MG tablet, Take 1 tablet (10 mg total) by mouth daily. (Patient not taking: Reported on 06/30/2016), Disp: 30 tablet, Rfl: 11 .  montelukast (SINGULAIR) 10 MG tablet, Take 1 tablet (10 mg total) by mouth at bedtime. (Patient not taking: Reported on 06/30/2016), Disp: 30 tablet, Rfl: 3 .  phentermine (ADIPEX-P) 37.5 MG tablet, Take 1  tablet (37.5 mg total) by mouth daily before breakfast. (Patient not taking: Reported on 06/30/2016), Disp: 30 tablet, Rfl: 3  Review of Systems  Constitutional: Negative.   HENT: Positive for dental problem.   Respiratory: Positive for cough.   Cardiovascular: Negative.   Gastrointestinal: Negative.   Genitourinary: Negative.   Allergic/Immunologic: Positive for environmental allergies.  Psychiatric/Behavioral: Positive for agitation and decreased concentration. The patient is nervous/anxious.     Social History  Substance Use Topics  . Smoking status: Current Some Day Smoker    Packs/day: 0.50    Types: Cigarettes  . Smokeless tobacco: Never Used  . Alcohol use No   Objective:   BP 140/90 (BP Location: Left Arm, Patient Position: Sitting, Cuff Size: Large)   Pulse 85   Temp 98.1 F (36.7 C) (Oral)   Resp 16   Ht 5\' 6"  (1.676 m)   Wt 184 lb (83.5 kg)   SpO2 98%   BMI 29.70 kg/m   Physical Exam  Constitutional: She appears well-developed and well-nourished. No distress.  Neck: Normal range of motion. Neck supple. No tracheal deviation present. No thyromegaly present.  Cardiovascular: Normal rate, regular rhythm and normal heart sounds.  Exam reveals no gallop and no friction rub.   No murmur heard. Pulmonary/Chest: Effort normal and breath sounds normal. No respiratory distress. She has no wheezes. She has no rales.  Lymphadenopathy:    She has no cervical adenopathy.  Skin: She is not diaphoretic.  Psychiatric: She has a normal mood and affect. Her behavior is normal. Judgment and thought content normal.  Vitals reviewed.     Assessment & Plan:  1. GAD (generalized anxiety disorder) Patient has not been taking xanax since she has been having the tooth abscess and on amoxil due to concerns of interactions. 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 Advised patient it is ok for her to continue her xanax. This  will be refilled for her as below. She is to call if symptoms worsen. - ALPRAZolam (XANAX) 1 MG tablet; TAKE 1/2 TO 1 TABLET BY MOUTH 3 TIMES DAILY AS NEEDED  Dispense: 90 tablet; Refill: 3  2. Overweight (BMI 25.0-29.9) Will discontinue phentermine until she has been through her oral procedures for her teeth. She will call once she has been through her oral procedures and ready to restart her weight loss.  3. Acute seasonal allergic rhinitis due to pollen Stable. Diagnosis pulled for medication refill. Continue current medical treatment plan. - fluticasone (FLONASE) 50 MCG/ACT nasal spray; Place 2 sprays into both nostrils daily.  Dispense: 16 g; Refill: 6  4. Bronchitis Stable. Diagnosis pulled for medication refill. Continue current medical treatment plan. - montelukast (SINGULAIR) 10 MG tablet; Take 1 tablet (10 mg total) by mouth at bedtime.  Dispense: 30 tablet; Refill: 3 mouth at bedtime.  Dispense: 30 tablet; Refill: 3  5. Tooth abscess Sees her dentist first week of march. She is to call if she has any flares before she is seen by her dentist.       Mar Daring, PA-C  [facility]

## 2016-07-01 ENCOUNTER — Telehealth: Payer: Self-pay

## 2016-07-01 NOTE — Telephone Encounter (Signed)
Prescription for Alprazolam 1 Mg tablet was called into [facility]  Thanks,  -Joseline

## 2016-07-02 ENCOUNTER — Encounter: Payer: Self-pay | Admitting: Physician Assistant

## 2016-07-02 DIAGNOSIS — K08409 Partial loss of teeth, unspecified cause, unspecified class: Secondary | ICD-10-CM

## 2016-07-02 DIAGNOSIS — R110 Nausea: Secondary | ICD-10-CM

## 2016-07-02 DIAGNOSIS — K047 Periapical abscess without sinus: Secondary | ICD-10-CM

## 2016-07-05 ENCOUNTER — Telehealth: Payer: Self-pay | Admitting: Physician Assistant

## 2016-07-05 MED ORDER — PROMETHAZINE HCL 25 MG PO TABS: 25.0000 mg | ORAL_TABLET | Freq: Three times a day (TID) | ORAL | 0 refills | Status: DC | PRN

## 2016-07-05 MED ORDER — AMOXICILLIN 875 MG PO TABS: 875.0000 mg | ORAL_TABLET | Freq: Two times a day (BID) | ORAL | 0 refills | Status: DC

## 2016-07-05 NOTE — Addendum Note (Signed)
Addended by: Mar Daring on: 07/05/2016 01:28 PM   Modules accepted: Orders

## 2016-07-05 NOTE — Telephone Encounter (Signed)
No message needed °

## 2016-07-07 MED ORDER — HYDROCODONE-ACETAMINOPHEN 5-325 MG PO TABS: 1.0000 | ORAL_TABLET | Freq: Four times a day (QID) | ORAL | 0 refills | Status: DC | PRN

## 2016-07-07 NOTE — Addendum Note (Signed)
Addended by: Mar Daring on: 07/07/2016 02:10 PM   Modules accepted: Orders

## 2016-07-13 ENCOUNTER — Encounter: Payer: Self-pay | Admitting: Physician Assistant

## 2016-11-30 ENCOUNTER — Encounter: Payer: Self-pay | Admitting: Physician Assistant

## 2016-11-30 ENCOUNTER — Ambulatory Visit (INDEPENDENT_AMBULATORY_CARE_PROVIDER_SITE_OTHER): Payer: 59 | Admitting: Physician Assistant

## 2016-11-30 VITALS — BP 170/80 | HR 110 | Temp 98.0°F | Resp 16 | Wt 189.4 lb

## 2016-11-30 DIAGNOSIS — F4312 Post-traumatic stress disorder, chronic: Secondary | ICD-10-CM | POA: Diagnosis not present

## 2016-11-30 DIAGNOSIS — F411 Generalized anxiety disorder: Secondary | ICD-10-CM

## 2016-11-30 DIAGNOSIS — F319 Bipolar disorder, unspecified: Secondary | ICD-10-CM

## 2016-11-30 MED ORDER — ALPRAZOLAM 1 MG PO TABS: ORAL_TABLET | ORAL | 3 refills | Status: DC

## 2016-11-30 MED ORDER — QUETIAPINE FUMARATE 25 MG PO TABS: 25.0000 mg | ORAL_TABLET | Freq: Every day | ORAL | 5 refills | Status: DC

## 2016-11-30 NOTE — Patient Instructions (Signed)
Quetiapine tablets What is this medicine? QUETIAPINE (kwe TYE a peen) is an antipsychotic. It is used to treat schizophrenia and bipolar disorder, also known as manic-depression. This medicine may be used for other purposes; ask your health care provider or pharmacist if you have questions. COMMON BRAND NAME(S): Seroquel What should I tell my health care provider before I take this medicine? They need to know if you have any of these conditions: -brain tumor or head injury -breast cancer -cataracts -diabetes -difficulty swallowing -heart disease -kidney disease -liver disease -low blood counts, like low white cell, platelet, or red cell counts -low blood pressure or dizziness when standing up -Parkinson's disease -previous heart attack -seizures -suicidal thoughts, plans, or attempt by you or a family member -thyroid disease -an unusual or allergic reaction to quetiapine, other medicines, foods, dyes, or preservatives -pregnant or trying to get pregnant -breast-feeding How should I use this medicine? Take this medicine by mouth. Swallow it with a drink of water. Follow the directions on the prescription label. If it upsets your stomach you can take it with food. Take your medicine at regular intervals. Do not take it more often than directed. Do not stop taking except on the advice of your doctor or health care professional. A special MedGuide will be given to you by the pharmacist with each prescription and refill. Be sure to read this information carefully each time. Talk to your pediatrician regarding the use of this medicine in children. While this drug may be prescribed for children as young as 10 years for selected conditions, precautions do apply. Patients over age 65 years may have a stronger reaction to this medicine and need smaller doses. Overdosage: If you think you have taken too much of this medicine contact a poison control center or emergency room at once. NOTE: This  medicine is only for you. Do not share this medicine with others. What if I miss a dose? If you miss a dose, take it as soon as you can. If it is almost time for your next dose, take only that dose. Do not take double or extra doses. What may interact with this medicine? Do not take this medicine with any of the following medications: -certain medicines for fungal infections like fluconazole, itraconazole, ketoconazole, posaconazole, voriconazole -cisapride -dofetilide -dronedarone -droperidol -grepafloxacin -halofantrine -phenothiazines like chlorpromazine, mesoridazine, thioridazine -pimozide -sparfloxacin -ziprasidone This medicine may also interact with the following medications: -alcohol -antiviral medicines for HIV or AIDS -certain medicines for blood pressure -certain medicines for depression, anxiety, or psychotic disturbances like haloperidol, lorazepam -certain medicines for diabetes -certain medicines for Parkinson's disease -certain medicines for seizures like carbamazepine, phenobarbital, phenytoin -cimetidine -erythromycin -other medicines that prolong the QT interval (cause an abnormal heart rhythm) -rifampin -steroid medicines like prednisone or cortisone This list may not describe all possible interactions. Give your health care provider a list of all the medicines, herbs, non-prescription drugs, or dietary supplements you use. Also tell them if you smoke, drink alcohol, or use illegal drugs. Some items may interact with your medicine. What should I watch for while using this medicine? Visit your doctor or health care professional for regular checks on your progress. It may be several weeks before you see the full effects of this medicine. Your health care provider may suggest that you have your eyes examined prior to starting this medicine, and every 6 months thereafter. If you have been taking this medicine regularly for some time, do not suddenly stop taking it.  You must gradually   reduce the dose or your symptoms may get worse. Ask your doctor or health care professional for advice. Patients and their families should watch out for worsening depression or thoughts of suicide. Also watch out for sudden or severe changes in feelings such as feeling anxious, agitated, panicky, irritable, hostile, aggressive, impulsive, severely restless, overly excited and hyperactive, or not being able to sleep. If this happens, especially at the beginning of antidepressant treatment or after a change in dose, call your health care professional. You may get dizzy or drowsy. Do not drive, use machinery, or do anything that needs mental alertness until you know how this medicine affects you. Do not stand or sit up quickly, especially if you are an older patient. This reduces the risk of dizzy or fainting spells. Alcohol can increase dizziness and drowsiness. Avoid alcoholic drinks. Do not treat yourself for colds, diarrhea or allergies. Ask your doctor or health care professional for advice, some ingredients may increase possible side effects. This medicine can reduce the response of your body to heat or cold. Dress warm in cold weather and stay hydrated in hot weather. If possible, avoid extreme temperatures like saunas, hot tubs, very hot or cold showers, or activities that can cause dehydration such as vigorous exercise. What side effects may I notice from receiving this medicine? Side effects that you should report to your doctor or health care professional as soon as possible: -allergic reactions like skin rash, itching or hives, swelling of the face, lips, or tongue -difficulty swallowing -fast or irregular heartbeat -fever or chills, sore throat -fever with rash, swollen lymph nodes, or swelling of the face -increased hunger or thirst -increased urination -problems with balance, talking, walking -seizures -stiff muscles -suicidal thoughts or other mood  changes -uncontrollable head, mouth, neck, arm, or leg movements -unusually weak or tired Side effects that usually do not require medical attention (report to your doctor or health care professional if they continue or are bothersome): -change in sex drive or performance -constipation -drowsy or dizzy -dry mouth -stomach upset -weight gain This list may not describe all possible side effects. Call your doctor for medical advice about side effects. You may report side effects to FDA at 1-800-FDA-1088. Where should I keep my medicine? Keep out of the reach of children. Store at room temperature between 15 and 30 degrees C (59 and 86 degrees F). Throw away any unused medicine after the expiration date. NOTE: This sheet is a summary. It may not cover all possible information. If you have questions about this medicine, talk to your doctor, pharmacist, or health care provider.  2018 Elsevier/Gold Standard (2014-11-05 13:07:35)  

## 2016-11-30 NOTE — Progress Notes (Signed)
Patient: Ashley Suarez Female    DOB: [birth year]   31 y.o.   MRN: [id number] Visit Date: 11/30/2016  Today's Provider: Mar Daring, PA-C   Chief Complaint  Patient presents with  . Follow-up    anxiety   Subjective:    HPI  Anxiety, Follow up:  The patient was last seen for anxiety 5 months ago. Changes made since that visit include continue Xanax.  She reports poor compliance with treatment.She is not taking the Xanax. She was doing very well. Symptoms started back two weeks ago. She is not having side effects.  She has the following symptoms: fatigue, irritable, palpitations, psychomotor agitation, racing thoughts, shortness of breath, sweating, left arm goes numb, very stress at work.  Other symptoms: shooting pains going down her right leg, tremors on both hands. She missed work today.  Patient does have long standing psych history since she was a young teenager. She reports she has been through many treatments and has had multiple medications used with many side effects. She has been very hesitant to start any new medications. She did try lexapro at one time with myself but did D/C. She reports she has used Seroquel XR 50mg  BID with xanax in the past successfully. She has had many reactions to the SSRI/SNRI classes.  ------------------------------------------------------------------------    Allergies  Allergen Reactions  . Hydrocodone-Acetaminophen Nausea And Vomiting  . Tramadol Nausea And Vomiting     Current Outpatient Prescriptions:  Marland Kitchen  Multiple Vitamin (MULTI-VITAMINS) TABS, Take by mouth., Disp: , Rfl:  .  ALPRAZolam (XANAX) 1 MG tablet, TAKE 1/2 TO 1 TABLET BY MOUTH 3 TIMES DAILY AS NEEDED (Patient not taking: Reported on 11/30/2016), Disp: 90 tablet, Rfl: 3 .  amoxicillin (AMOXIL) 875 MG tablet, Take 1 tablet (875 mg total) by mouth 2 (two) times daily. (Patient not taking: Reported on 11/30/2016), Disp: 20 tablet, Rfl: 0 .  cetirizine (ZYRTEC) 10  MG tablet, Take 1 tablet (10 mg total) by mouth daily. (Patient not taking: Reported on 06/30/2016), Disp: 30 tablet, Rfl: 11 .  fluticasone (FLONASE) 50 MCG/ACT nasal spray, Place 2 sprays into both nostrils daily. (Patient not taking: Reported on 11/30/2016), Disp: 16 g, Rfl: 6 .  HYDROcodone-acetaminophen (NORCO/VICODIN) 5-325 MG tablet, Take 1 tablet by mouth every 6 (six) hours as needed for moderate pain. (Patient not taking: Reported on 11/30/2016), Disp: 30 tablet, Rfl: 0 .  montelukast (SINGULAIR) 10 MG tablet, Take 1 tablet (10 mg total) by mouth at bedtime. (Patient not taking: Reported on 11/30/2016), Disp: 30 tablet, Rfl: 3 .  phentermine (ADIPEX-P) 37.5 MG tablet, Take 1 tablet (37.5 mg total) by mouth daily before breakfast. (Patient not taking: Reported on 06/30/2016), Disp: 30 tablet, Rfl: 3 .  promethazine (PHENERGAN) 25 MG tablet, Take 1 tablet (25 mg total) by mouth every 8 (eight) hours as needed for nausea or vomiting. (Patient not taking: Reported on 11/30/2016), Disp: 20 tablet, Rfl: 0  Review of Systems  Constitutional: Negative.   Respiratory: Negative.   Cardiovascular: Negative.   Gastrointestinal: Negative.   Neurological: Negative.   Psychiatric/Behavioral: Positive for agitation, decreased concentration, dysphoric mood and sleep disturbance. Negative for self-injury and suicidal ideas. The patient is nervous/anxious.     Social History  Substance Use Topics  . Smoking status: Current Some Day Smoker    Packs/day: 0.50    Types: Cigarettes  . Smokeless tobacco: Never Used  . Alcohol use No   Objective:   BP Marland Kitchen)  170/80 (BP Location: Left Arm, Patient Position: Sitting, Cuff Size: Normal)   Pulse (!) 110   Temp 98 F (36.7 C) (Oral)   Resp 16   Wt 189 lb 6.4 oz (85.9 kg)   SpO2 99%   BMI 30.57 kg/m    Physical Exam  Constitutional: She appears well-developed and well-nourished. No distress.  Neck: Normal range of motion. Neck supple.  Cardiovascular:  Normal rate, regular rhythm and normal heart sounds.  Exam reveals no gallop and no friction rub.   No murmur heard. Pulmonary/Chest: Effort normal and breath sounds normal. No respiratory distress. She has no wheezes. She has no rales.  Skin: She is not diaphoretic.  Psychiatric: Her speech is normal and behavior is normal. Judgment normal. Her mood appears anxious. Cognition and memory are normal. She exhibits a depressed mood. She expresses no suicidal plans and no homicidal plans.  Very tearful at multiple times today during the interview.  Vitals reviewed.       Assessment & Plan:     1. Bipolar affective disorder, remission status unspecified ([place]) Patient is willing to restart therapy and psychiatry. I believe patient is going to need both counseling and medication management. Referral has been placed as below. Patient is still very hesitant to start treatment again because she has had such bad experiences in the past. She is requesting a female psychiatrist and counselor as well. Will restart low dose seroquel as below to see if this helps her with sleep and mood stabilization. Continue Xanax prn. She is to call in the meantime if symptoms worsen.  - Ambulatory referral to Psychiatry - QUEtiapine (SEROQUEL) 25 MG tablet; Take 1 tablet (25 mg total) by mouth at bedtime.  Dispense: 30 tablet; Refill: 5 - ALPRAZolam (XANAX) 1 MG tablet; TAKE 1/2 TO 1 TABLET BY MOUTH 3 TIMES DAILY AS NEEDED  Dispense: 90 tablet; Refill: 3  2. GAD (generalized anxiety disorder) See above medical treatment plan. - Ambulatory referral to Psychiatry - QUEtiapine (SEROQUEL) 25 MG tablet; Take 1 tablet (25 mg total) by mouth at bedtime.  Dispense: 30 tablet; Refill: 5 - ALPRAZolam (XANAX) 1 MG tablet; TAKE 1/2 TO 1 TABLET BY MOUTH 3 TIMES DAILY AS NEEDED  Dispense: 90 tablet; Refill: 3  3. Chronic post-traumatic stress disorder See above medical treatment plan. - Ambulatory referral to Psychiatry -  QUEtiapine (SEROQUEL) 25 MG tablet; Take 1 tablet (25 mg total) by mouth at bedtime.  Dispense: 30 tablet; Refill: 5 - ALPRAZolam (XANAX) 1 MG tablet; TAKE 1/2 TO 1 TABLET BY MOUTH 3 TIMES DAILY AS NEEDED  Dispense: 90 tablet; Refill: [place], PA-C  [facility]

## 2016-12-02 ENCOUNTER — Encounter: Payer: Self-pay | Admitting: Physician Assistant

## 2016-12-27 ENCOUNTER — Encounter: Payer: Self-pay | Admitting: Physician Assistant

## 2016-12-27 DIAGNOSIS — F4312 Post-traumatic stress disorder, chronic: Secondary | ICD-10-CM

## 2016-12-27 DIAGNOSIS — F319 Bipolar disorder, unspecified: Secondary | ICD-10-CM

## 2017-01-12 ENCOUNTER — Encounter: Payer: Self-pay | Admitting: Physician Assistant

## 2017-01-25 DIAGNOSIS — H52223 Regular astigmatism, bilateral: Secondary | ICD-10-CM | POA: Diagnosis not present

## 2017-01-25 DIAGNOSIS — H5203 Hypermetropia, bilateral: Secondary | ICD-10-CM | POA: Diagnosis not present

## 2017-02-16 ENCOUNTER — Encounter: Payer: Self-pay | Admitting: Physician Assistant

## 2017-02-16 NOTE — Telephone Encounter (Signed)
Ok to send records we have on file from her previous psychiatrist for continuity of care to Dr. Nicolasa Ducking if HIPAA release form signed. Fax number noted in Ashley Suarez.

## 2017-02-17 DIAGNOSIS — F1010 Alcohol abuse, uncomplicated: Secondary | ICD-10-CM | POA: Diagnosis not present

## 2017-02-17 DIAGNOSIS — F410 Panic disorder [episodic paroxysmal anxiety] without agoraphobia: Secondary | ICD-10-CM | POA: Diagnosis not present

## 2017-02-17 DIAGNOSIS — F5105 Insomnia due to other mental disorder: Secondary | ICD-10-CM | POA: Diagnosis not present

## 2017-02-17 DIAGNOSIS — F319 Bipolar disorder, unspecified: Secondary | ICD-10-CM | POA: Diagnosis not present

## 2017-02-17 DIAGNOSIS — F411 Generalized anxiety disorder: Secondary | ICD-10-CM | POA: Diagnosis not present

## 2017-02-21 ENCOUNTER — Encounter: Payer: Self-pay | Admitting: Physician Assistant

## 2017-03-03 ENCOUNTER — Emergency Department: Payer: 59

## 2017-03-03 ENCOUNTER — Inpatient Hospital Stay
Admission: EM | Admit: 2017-03-03 | Discharge: 2017-03-04 | DRG: 440 | Disposition: A | Discharge status: Home / Self Care | Payer: 59 | Attending: Internal Medicine | Admitting: Internal Medicine

## 2017-03-03 ENCOUNTER — Encounter: Payer: Self-pay | Admitting: Emergency Medicine

## 2017-03-03 DIAGNOSIS — Z720 Tobacco use: Secondary | ICD-10-CM | POA: Diagnosis not present

## 2017-03-03 DIAGNOSIS — F988 Other specified behavioral and emotional disorders with onset usually occurring in childhood and adolescence: Secondary | ICD-10-CM | POA: Diagnosis present

## 2017-03-03 DIAGNOSIS — Z885 Allergy status to narcotic agent status: Secondary | ICD-10-CM

## 2017-03-03 DIAGNOSIS — F1010 Alcohol abuse, uncomplicated: Secondary | ICD-10-CM | POA: Diagnosis present

## 2017-03-03 DIAGNOSIS — F319 Bipolar disorder, unspecified: Secondary | ICD-10-CM | POA: Diagnosis present

## 2017-03-03 DIAGNOSIS — G8929 Other chronic pain: Secondary | ICD-10-CM | POA: Diagnosis present

## 2017-03-03 DIAGNOSIS — K852 Alcohol induced acute pancreatitis without necrosis or infection: Secondary | ICD-10-CM | POA: Diagnosis not present

## 2017-03-03 DIAGNOSIS — F4312 Post-traumatic stress disorder, chronic: Secondary | ICD-10-CM | POA: Diagnosis present

## 2017-03-03 DIAGNOSIS — K859 Acute pancreatitis without necrosis or infection, unspecified: Secondary | ICD-10-CM | POA: Diagnosis present

## 2017-03-03 DIAGNOSIS — R1013 Epigastric pain: Secondary | ICD-10-CM | POA: Diagnosis not present

## 2017-03-03 DIAGNOSIS — F17210 Nicotine dependence, cigarettes, uncomplicated: Secondary | ICD-10-CM | POA: Diagnosis present

## 2017-03-03 DIAGNOSIS — F419 Anxiety disorder, unspecified: Secondary | ICD-10-CM | POA: Diagnosis present

## 2017-03-03 DIAGNOSIS — K2980 Duodenitis without bleeding: Secondary | ICD-10-CM | POA: Diagnosis present

## 2017-03-03 DIAGNOSIS — R109 Unspecified abdominal pain: Secondary | ICD-10-CM | POA: Diagnosis not present

## 2017-03-03 LAB — COMPREHENSIVE METABOLIC PANEL
ALBUMIN: 4.5 g/dL (ref 3.5–5.0)
ALT: 20 U/L (ref 14–54)
ANION GAP: 10 (ref 5–15)
AST: 24 U/L (ref 15–41)
Alkaline Phosphatase: 62 U/L (ref 38–126)
BUN: 6 mg/dL (ref 6–20)
CHLORIDE: 102 mmol/L (ref 101–111)
CO2: 22 mmol/L (ref 22–32)
Calcium: 9.4 mg/dL (ref 8.9–10.3)
Creatinine, Ser: 0.9 mg/dL (ref 0.44–1.00)
GFR calc Af Amer: >60 mL/min (ref >60)
GFR calc non Af Amer: >60 mL/min (ref >60)
GLUCOSE: 113 mg/dL — AB (ref 65–99)
POTASSIUM: 3.5 mmol/L (ref 3.5–5.1)
SODIUM: 134 mmol/L — AB (ref 135–145)
TOTAL PROTEIN: 7.6 g/dL (ref 6.5–8.1)
Total Bilirubin: 1 mg/dL (ref 0.3–1.2)

## 2017-03-03 LAB — CBC
HCT: 43.2 % (ref 35.0–47.0)
Hemoglobin: 15 g/dL (ref 12.0–16.0)
MCH: 34 pg (ref 26.0–34.0)
MCHC: 34.8 g/dL (ref 32.0–36.0)
MCV: 97.8 fL (ref 80.0–100.0)
PLATELETS: 280 10*3/uL (ref 150–440)
RBC: 4.41 MIL/uL (ref 3.80–5.20)
RDW: 12.8 % (ref 11.5–14.5)
WBC: 9.5 10*3/uL (ref 3.6–11.0)

## 2017-03-03 LAB — POCT PREGNANCY, URINE: PREG TEST UR: NEGATIVE

## 2017-03-03 LAB — LIPASE, BLOOD: Lipase: 56 U/L — ABNORMAL HIGH (ref 11–51)

## 2017-03-03 LAB — MAGNESIUM: Magnesium: 1.9 mg/dL (ref 1.7–2.4)

## 2017-03-03 LAB — TROPONIN I: Troponin I: <0.03 ng/mL (ref <0.03)

## 2017-03-03 MED ORDER — HYDROCODONE-ACETAMINOPHEN 5-325 MG PO TABS
1.0000 | ORAL_TABLET | ORAL | Status: DC | PRN
  Administered 2017-03-03: 1 via ORAL
  Administered 2017-03-04: 2 via ORAL
  Filled 2017-03-03: qty 2
  Filled 2017-03-03: qty 1

## 2017-03-03 MED ORDER — ALBUTEROL SULFATE (2.5 MG/3ML) 0.083% IN NEBU: 2.5000 mg | INHALATION_SOLUTION | RESPIRATORY_TRACT | Status: DC | PRN

## 2017-03-03 MED ORDER — ALPRAZOLAM 0.5 MG PO TABS
0.5000 mg | ORAL_TABLET | Freq: Three times a day (TID) | ORAL | Status: DC | PRN
  Administered 2017-03-04: 0.5 mg via ORAL
  Filled 2017-03-03: qty 1

## 2017-03-03 MED ORDER — MORPHINE SULFATE (PF) 4 MG/ML IV SOLN
4.0000 mg | Freq: Once | INTRAVENOUS | Status: AC
  Administered 2017-03-03: 4 mg via INTRAVENOUS
  Filled 2017-03-03: qty 1

## 2017-03-03 MED ORDER — SODIUM CHLORIDE 0.9 % IV SOLN
1000.0000 mL | Freq: Once | INTRAVENOUS | Status: AC
Start: 2017-03-03 — End: 2017-03-03
  Administered 2017-03-03: 1000 mL via INTRAVENOUS

## 2017-03-03 MED ORDER — LORAZEPAM 2 MG/ML IJ SOLN: 1.0000 mg | Freq: Four times a day (QID) | INTRAMUSCULAR | Status: DC | PRN

## 2017-03-03 MED ORDER — ONDANSETRON HCL 4 MG PO TABS
4.0000 mg | ORAL_TABLET | Freq: Four times a day (QID) | ORAL | Status: DC | PRN
  Administered 2017-03-04: 4 mg via ORAL
  Filled 2017-03-03: qty 1

## 2017-03-03 MED ORDER — ADULT MULTIVITAMIN W/MINERALS CH
1.0000 | ORAL_TABLET | Freq: Every day | ORAL | Status: DC
  Administered 2017-03-03 – 2017-03-04 (×2): 1 via ORAL
  Filled 2017-03-03 (×2): qty 1

## 2017-03-03 MED ORDER — ONDANSETRON HCL 4 MG/2ML IJ SOLN
INTRAMUSCULAR | Status: AC
  Administered 2017-03-03: 4 mg via INTRAVENOUS
  Filled 2017-03-03: qty 2

## 2017-03-03 MED ORDER — IOPAMIDOL (ISOVUE-300) INJECTION 61%
100.0000 mL | Freq: Once | INTRAVENOUS | Status: AC | PRN
  Administered 2017-03-03: 100 mL via INTRAVENOUS

## 2017-03-03 MED ORDER — NICOTINE 14 MG/24HR TD PT24
14.0000 mg | MEDICATED_PATCH | Freq: Every day | TRANSDERMAL | Status: DC
  Administered 2017-03-03 – 2017-03-04 (×2): 14 mg via TRANSDERMAL
  Filled 2017-03-03 (×2): qty 1

## 2017-03-03 MED ORDER — QUETIAPINE FUMARATE 25 MG PO TABS
25.0000 mg | ORAL_TABLET | Freq: Every day | ORAL | Status: DC
  Filled 2017-03-03: qty 1

## 2017-03-03 MED ORDER — BISACODYL 5 MG PO TBEC: 5.0000 mg | DELAYED_RELEASE_TABLET | Freq: Every day | ORAL | Status: DC | PRN

## 2017-03-03 MED ORDER — ONDANSETRON HCL 4 MG/2ML IJ SOLN: 4.0000 mg | Freq: Four times a day (QID) | INTRAMUSCULAR | Status: DC | PRN

## 2017-03-03 MED ORDER — SENNOSIDES-DOCUSATE SODIUM 8.6-50 MG PO TABS: 1.0000 | ORAL_TABLET | Freq: Every evening | ORAL | Status: DC | PRN

## 2017-03-03 MED ORDER — KETOROLAC TROMETHAMINE 30 MG/ML IJ SOLN
30.0000 mg | Freq: Four times a day (QID) | INTRAMUSCULAR | Status: DC | PRN
  Filled 2017-03-03: qty 1

## 2017-03-03 MED ORDER — FOLIC ACID 1 MG PO TABS
1.0000 mg | ORAL_TABLET | Freq: Every day | ORAL | Status: DC
  Administered 2017-03-03 – 2017-03-04 (×2): 1 mg via ORAL
  Filled 2017-03-03 (×2): qty 1

## 2017-03-03 MED ORDER — LORAZEPAM 1 MG PO TABS: 1.0000 mg | ORAL_TABLET | Freq: Four times a day (QID) | ORAL | Status: DC | PRN

## 2017-03-03 MED ORDER — THIAMINE HCL 100 MG/ML IJ SOLN: 100.0000 mg | Freq: Every day | INTRAMUSCULAR | Status: DC

## 2017-03-03 MED ORDER — ENOXAPARIN SODIUM 40 MG/0.4ML ~~LOC~~ SOLN
40.0000 mg | SUBCUTANEOUS | Status: DC
  Administered 2017-03-03: 40 mg via SUBCUTANEOUS
  Filled 2017-03-03: qty 0.4

## 2017-03-03 MED ORDER — ONDANSETRON HCL 4 MG/2ML IJ SOLN
4.0000 mg | Freq: Once | INTRAMUSCULAR | Status: AC
  Administered 2017-03-03: 4 mg via INTRAVENOUS
  Filled 2017-03-03: qty 2

## 2017-03-03 MED ORDER — VITAMIN B-1 100 MG PO TABS
100.0000 mg | ORAL_TABLET | Freq: Every day | ORAL | Status: DC
  Administered 2017-03-03 – 2017-03-04 (×2): 100 mg via ORAL
  Filled 2017-03-03 (×2): qty 1

## 2017-03-03 MED ORDER — IOPAMIDOL (ISOVUE-300) INJECTION 61%: 30.0000 mL | Freq: Once | INTRAVENOUS | Status: DC | PRN

## 2017-03-03 MED ORDER — ONDANSETRON HCL 4 MG/2ML IJ SOLN
4.0000 mg | Freq: Once | INTRAMUSCULAR | Status: AC
  Administered 2017-03-03: 4 mg via INTRAVENOUS

## 2017-03-03 MED ORDER — LAMOTRIGINE 25 MG PO TABS
25.0000 mg | ORAL_TABLET | Freq: Every day | ORAL | Status: DC
Start: 2017-03-04 — End: 2017-03-04
  Administered 2017-03-04: 25 mg via ORAL
  Filled 2017-03-03: qty 1

## 2017-03-03 MED ORDER — SODIUM CHLORIDE 0.9 % IV SOLN
INTRAVENOUS | Status: DC
  Administered 2017-03-03 – 2017-03-04 (×3): via INTRAVENOUS

## 2017-03-03 MED ORDER — SERTRALINE HCL 25 MG PO TABS
25.0000 mg | ORAL_TABLET | Freq: Every day | ORAL | Status: DC
Start: 2017-03-04 — End: 2017-03-04
  Administered 2017-03-04: 25 mg via ORAL
  Filled 2017-03-03: qty 1

## 2017-03-03 NOTE — ED Triage Notes (Addendum)
Patient presents to the ED with right upper quadrant pain that began yesterday and is now radiating into her right flank.  Patient appears uncomfortable during triage.  Patient reports history of kidney stones but states this feels somewhat different.  Patient is guarding her right upper quadrant at this time.  Speaking in full sentences without difficulty.  Patient denies vomiting and diarrhea.

## 2017-03-03 NOTE — Plan of Care (Signed)
Problem: Activity: Goal: Risk for activity intolerance will decrease Outcome: Progressing Pt ambulating to bathroom    
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
## 2017-03-03 NOTE — ED Notes (Signed)
Patient made aware of need of urine sample. States she is unable to void at this time.  

## 2017-03-04 DIAGNOSIS — K852 Alcohol induced acute pancreatitis without necrosis or infection: Principal | ICD-10-CM

## 2017-03-04 DIAGNOSIS — G8929 Other chronic pain: Secondary | ICD-10-CM

## 2017-03-04 DIAGNOSIS — R1013 Epigastric pain: Secondary | ICD-10-CM

## 2017-03-04 LAB — CBC
HEMATOCRIT: 35.5 % (ref 35.0–47.0)
Hemoglobin: 12.5 g/dL (ref 12.0–16.0)
MCH: 34.1 pg — ABNORMAL HIGH (ref 26.0–34.0)
MCHC: 35.1 g/dL (ref 32.0–36.0)
MCV: 97.2 fL (ref 80.0–100.0)
Platelets: 222 10*3/uL (ref 150–440)
RBC: 3.65 MIL/uL — ABNORMAL LOW (ref 3.80–5.20)
RDW: 12.6 % (ref 11.5–14.5)
WBC: 6.6 10*3/uL (ref 3.6–11.0)

## 2017-03-04 LAB — BASIC METABOLIC PANEL
Anion gap: 4 — ABNORMAL LOW (ref 5–15)
CHLORIDE: 107 mmol/L (ref 101–111)
CO2: 27 mmol/L (ref 22–32)
Calcium: 8.6 mg/dL — ABNORMAL LOW (ref 8.9–10.3)
Creatinine, Ser: 0.72 mg/dL (ref 0.44–1.00)
GFR calc Af Amer: >60 mL/min (ref >60)
GFR calc non Af Amer: >60 mL/min (ref >60)
GLUCOSE: 83 mg/dL (ref 65–99)
POTASSIUM: 3.7 mmol/L (ref 3.5–5.1)
Sodium: 138 mmol/L (ref 135–145)

## 2017-03-04 LAB — HIV ANTIBODY (ROUTINE TESTING W REFLEX): HIV SCREEN 4TH GENERATION: NONREACTIVE

## 2017-03-04 LAB — LIPASE, BLOOD: Lipase: 114 U/L — ABNORMAL HIGH (ref 11–51)

## 2017-03-04 MED ORDER — HYDROCODONE-ACETAMINOPHEN 5-325 MG PO TABS: 1.0000 | ORAL_TABLET | Freq: Four times a day (QID) | ORAL | 0 refills | Status: DC | PRN

## 2017-03-04 MED ORDER — TRAMADOL HCL 50 MG PO TABS: 50.0000 mg | ORAL_TABLET | Freq: Four times a day (QID) | ORAL | 0 refills | Status: DC | PRN
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
## 2017-03-04 NOTE — Progress Notes (Signed)
[place], [place].   03/04/2017  Patient: Ashley Suarez   Date of Birth:  06-Mar-1986  Date of admission:  03/03/2017  Date of Discharge  03/04/2017    To Whom it May Concern:   [name]  may return to work on 03/07/2017.  PHYSICAL ACTIVITY:  Full  If you have any questions or concerns, please don't hesitate to call.  Sincerely,   Hillary Bow R M.D Office : [phone]   .

## 2017-03-04 NOTE — Progress Notes (Signed)
MD ordered patient to be discharged home.  Discharge instructions were reviewed with the patient and she voiced understanding.  Patient instructed on making her follow-up appointment.  Prescription given to the patient.  IV was removed with catheter intact.  All patients questions were answered.  Patient left via wheelchair escorted by  auxillary.

## 2017-03-08 ENCOUNTER — Encounter: Payer: Self-pay | Admitting: Physician Assistant

## 2017-03-09 ENCOUNTER — Encounter: Payer: Self-pay | Admitting: Physician Assistant

## 2017-03-09 ENCOUNTER — Ambulatory Visit (INDEPENDENT_AMBULATORY_CARE_PROVIDER_SITE_OTHER): Payer: 59 | Admitting: Physician Assistant

## 2017-03-09 VITALS — BP 110/82 | HR 92 | Temp 98.2°F | Wt 187.2 lb

## 2017-03-09 DIAGNOSIS — Z136 Encounter for screening for cardiovascular disorders: Secondary | ICD-10-CM

## 2017-03-09 DIAGNOSIS — K852 Alcohol induced acute pancreatitis without necrosis or infection: Secondary | ICD-10-CM | POA: Diagnosis not present

## 2017-03-09 DIAGNOSIS — R5383 Other fatigue: Secondary | ICD-10-CM

## 2017-03-09 DIAGNOSIS — Z13220 Encounter for screening for lipoid disorders: Secondary | ICD-10-CM | POA: Diagnosis not present

## 2017-03-09 MED ORDER — HYDROCODONE-ACETAMINOPHEN 10-325 MG PO TABS: 1.0000 | ORAL_TABLET | Freq: Three times a day (TID) | ORAL | 0 refills | Status: DC | PRN

## 2017-03-09 NOTE — Progress Notes (Signed)
Patient: Ashley Suarez Female    DOB: 1986/03/03   31 y.o.   MRN: [id number] Visit Date: 03/09/2017  Today's Provider: Mar Daring, PA-C   Chief Complaint  Patient presents with  . Hospitalization Follow-up   Subjective:    HPI  Follow up ER visit  Patient was seen in ER for abdominal pain on 03/03/2017. She was treated for alcohol-induce acute pancreatitis. Treatment for this included: START taking: HYDROcodone-acetaminophen (NORCO/VICODIN)  STOP taking: QUEtiapine 25 MG tablet (SEROQUEL).  She reports good compliance with treatment. She reports this condition is slightly improved. Pain level is 3/10.  She reports that she has not had any alcohol since admission. She has tried to adhere to a fairly bland diet. She does mention that one night she tried to eat pizza and the pain spiked to about a 5-6/10. She reports the pain spikes still come in waves. When it peaks she will take the hydrocodone-apap 5-325mg  without any relief.   She is continuing to follow up with Dr. Nicolasa Ducking as well and feels she is doing well with treatment goals they are working through together and enjoys seeing her counselor at [facility] as well.   ------------------------------------------------------------------------------------     Previous Medications   ALPRAZOLAM (XANAX) 1 MG TABLET    TAKE 1/2 TO 1 TABLET BY MOUTH 3 TIMES DAILY AS NEEDED   HYDROCODONE-ACETAMINOPHEN (NORCO/VICODIN) 5-325 MG TABLET    Take 1 tablet by mouth every 6 (six) hours as needed for severe pain.   HYDROXYZINE (VISTARIL) 25 MG CAPSULE       LAMOTRIGINE (LAMICTAL) 25 MG TABLET    Take 1 tablet by mouth daily. Take 1 TABLET D FOR 2 WEEKS THEN TAKE 2 TABLETS D WITH BREAKFAST   MULTIPLE VITAMIN (MULTI-VITAMINS) TABS    Take by mouth.   RANITIDINE (ZANTAC) 150 MG TABLET    Take 150 mg by mouth 2 (two) times daily.   SERTRALINE (ZOLOFT) 25 MG TABLET    Take 25 mg by mouth daily.    Review of Systems  Constitutional:  Negative.   Respiratory: Negative.   Cardiovascular: Negative.   Gastrointestinal: Positive for abdominal pain.  Musculoskeletal: Positive for back pain.  Neurological: Negative.   Psychiatric/Behavioral: Positive for dysphoric mood and sleep disturbance. The patient is nervous/anxious.     Social History  Substance Use Topics  . Smoking status: Current Some Day Smoker    Packs/day: 0.50    Types: Cigarettes  . Smokeless tobacco: Never Used  . Alcohol use 0.0 oz/week   Objective:   BP 110/82 (BP Location: Left Arm, Patient Position: Sitting, Cuff Size: Normal)   Pulse 92   Temp 98.2 F (36.8 C) (Oral)   Wt 187 lb 3.2 oz (84.9 kg)   SpO2 98%   BMI 30.21 kg/m   Physical Exam  Constitutional: She is oriented to person, place, and time. She appears well-developed and well-nourished. No distress.  Cardiovascular: Normal rate, regular rhythm and normal heart sounds.  Exam reveals no gallop and no friction rub.   No murmur heard. Pulmonary/Chest: Effort normal and breath sounds normal. No respiratory distress. She has no wheezes. She has no rales.  Abdominal: Soft. Normal appearance and bowel sounds are normal. She exhibits no distension and no mass. There is no hepatosplenomegaly. There is tenderness in the right upper quadrant, epigastric area and left upper quadrant. There is no rebound, no guarding and no CVA tenderness.  Neurological: She is alert and oriented to person, place, and  time.  Skin: Skin is warm and dry. She is not diaphoretic.       Assessment & Plan:     1. Alcohol-induced acute pancreatitis without infection or necrosis Will recheck labs as below to see if labs are improving. Hydrocodone-apap 10-325mg  was given as below since the 5-325mg  was not effective. Patient advised to still only use on prn basis at worst pain. She agrees. Continue bland diet. She is to call the office if symptoms have not resolved by next week and I will refer her to GI. She agrees.  -  CBC w/Diff/Platelet - Lipase - COMPLETE METABOLIC PANEL WITH GFR - HYDROcodone-acetaminophen (NORCO) 10-325 MG tablet; Take 1 tablet by mouth every 8 (eight) hours as needed.  Dispense: 15 tablet; Refill: 0  2. Encounter for lipid screening for cardiovascular disease Will check labs as below and f/u pending results. - Lipid Profile  3. Fatigue, unspecified type Will check labs as below and f/u pending results. - TSH   Follow up: No Follow-up on file.

## 2017-03-09 NOTE — Patient Instructions (Signed)
Acute Pancreatitis  Acute pancreatitis is a condition in which the pancreas suddenly becomes irritated and swollen (has inflammation). The pancreas is a gland that is located behind the stomach. It produces enzymes that help to digest food. The pancreas also releases the hormones glucagon and insulin, which help to regulate blood sugar. Damage to the pancreas occurs when the digestive enzymes from the pancreas are activated before they are released into the intestine.  Most acute attacks last a couple of days and can cause serious problems. Some people become dehydrated and develop low blood pressure. In severe cases, bleeding into the pancreas can lead to shock and can be life-threatening. The lungs, heart, and kidneys may fail.  What are the causes?  The most common causes of this condition are:  · Alcohol abuse.  · Gallstones.    Other causes include:  · Certain medicines.  · Exposure to certain chemicals.  · Infection.  · Damage caused by an accident (trauma).  · Abdominal surgery.    In some cases, the cause may not be known.  What are the signs or symptoms?  Symptoms of this condition include:  · Pain in the upper abdomen that may radiate to the back.  · Tenderness and swelling of the abdomen.  · Nausea and vomiting.    How is this diagnosed?  This condition may be diagnosed based on:  · A physical exam.  · Blood tests.  · Imaging tests, such as X-rays, CT scans, or an ultrasound of the abdomen.    How is this treated?  Treatment for this condition usually requires a stay in the hospital. Treatment may include:  · Pain medicine.  · Fluid replacement through an IV tube.  · Placing a tube in the stomach to remove stomach contents and to control vomiting (NG tube, or nasogastric tube).  · Not eating for 3-4 days. This gives the pancreas a rest, because enzymes are not being produced that can cause further damage.  · Antibiotic medicines, if your condition is caused by an infection.  · Surgery on the pancreas or  gallbladder.    Follow these instructions at home:  Eating and drinking  · Follow instructions from your health care provider about diet. This may involve avoiding alcohol and decreasing the amount of fat in your diet.  · Eat smaller, more frequent meals. This reduces the amount of digestive fluids that the pancreas produces.  · Drink enough fluid to keep your urine clear or pale yellow.  · Do not drink alcohol if it caused your condition.  General instructions  · Take over-the-counter and prescription medicines only as told by your health care provider.  · Do not use any tobacco products, such as cigarettes, chewing tobacco, and e-cigarettes. If you need help quitting, ask your health care provider.  · Get plenty of rest.  · If directed, check your blood sugar at home as told by your health care provider.  · Keep all follow-up visits as told by your health care provider. This is important.  Contact a health care provider if:  · You do not recover as quickly as expected.  · You develop new or worsening symptoms.  · You have persistent pain, weakness, or nausea.  · You recover and then have another episode of pain.  · You have a fever.  Get help right away if:  · You cannot eat or keep fluids down.  · Your pain becomes severe.  · Your skin or the   white part of your eyes turns yellow (jaundice).  · You vomit.  · You feel dizzy or you faint.  · Your blood sugar is high (over 300 mg/dL).  This information is not intended to replace advice given to you by your health care provider. Make sure you discuss any questions you have with your health care provider.  Document Released: 05/03/2005 Document Revised: 09/10/2015 Document Reviewed: 02/04/2015  Elsevier Interactive Patient Education © 2018 [employer] Inc[employer]

## 2017-03-11 ENCOUNTER — Other Ambulatory Visit: Payer: 59

## 2017-03-11 ENCOUNTER — Encounter: Payer: Self-pay | Admitting: Physician Assistant

## 2017-03-11 DIAGNOSIS — R5383 Other fatigue: Secondary | ICD-10-CM | POA: Diagnosis not present

## 2017-03-11 DIAGNOSIS — Z13220 Encounter for screening for lipoid disorders: Secondary | ICD-10-CM | POA: Diagnosis not present

## 2017-03-11 DIAGNOSIS — Z136 Encounter for screening for cardiovascular disorders: Secondary | ICD-10-CM | POA: Diagnosis not present

## 2017-03-11 DIAGNOSIS — K852 Alcohol induced acute pancreatitis without necrosis or infection: Secondary | ICD-10-CM | POA: Diagnosis not present

## 2017-03-14 ENCOUNTER — Encounter: Payer: Self-pay | Admitting: Physician Assistant

## 2017-03-14 DIAGNOSIS — R7989 Other specified abnormal findings of blood chemistry: Secondary | ICD-10-CM
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
## 2017-03-15 NOTE — Addendum Note (Signed)
Addended by: Mar Daring on: 03/15/2017 11:55 AM   Modules accepted: Orders

## 2017-03-17 DIAGNOSIS — F5105 Insomnia due to other mental disorder: Secondary | ICD-10-CM | POA: Diagnosis not present

## 2017-03-17 DIAGNOSIS — F410 Panic disorder [episodic paroxysmal anxiety] without agoraphobia: Secondary | ICD-10-CM | POA: Diagnosis not present

## 2017-03-17 DIAGNOSIS — F411 Generalized anxiety disorder: Secondary | ICD-10-CM | POA: Diagnosis not present

## 2017-03-17 DIAGNOSIS — F319 Bipolar disorder, unspecified: Secondary | ICD-10-CM | POA: Diagnosis not present

## 2017-03-17 DIAGNOSIS — F1010 Alcohol abuse, uncomplicated: Secondary | ICD-10-CM | POA: Diagnosis not present

## 2017-03-23 ENCOUNTER — Ambulatory Visit: Payer: 59 | Admitting: Physician Assistant

## 2017-04-11 ENCOUNTER — Ambulatory Visit: Payer: Self-pay | Admitting: Obstetrics and Gynecology

## 2017-04-13 ENCOUNTER — Other Ambulatory Visit: Payer: Self-pay | Admitting: Physician Assistant

## 2017-04-13 ENCOUNTER — Other Ambulatory Visit: Payer: 59

## 2017-04-13 DIAGNOSIS — R7989 Other specified abnormal findings of blood chemistry: Secondary | ICD-10-CM | POA: Diagnosis not present

## 2017-04-14 LAB — TSH: TSH: 2.88 u[IU]/mL (ref 0.450–4.500)

## 2017-04-14 LAB — T4: T4 TOTAL: 6.1 ug/dL (ref 4.5–12.0)

## 2017-04-15 ENCOUNTER — Encounter: Payer: Self-pay | Admitting: Physician Assistant

## 2017-04-21 DIAGNOSIS — F5105 Insomnia due to other mental disorder: Secondary | ICD-10-CM | POA: Diagnosis not present

## 2017-04-21 DIAGNOSIS — F319 Bipolar disorder, unspecified: Secondary | ICD-10-CM | POA: Diagnosis not present

## 2017-04-21 DIAGNOSIS — F1010 Alcohol abuse, uncomplicated: Secondary | ICD-10-CM | POA: Diagnosis not present

## 2017-04-21 DIAGNOSIS — F411 Generalized anxiety disorder: Secondary | ICD-10-CM | POA: Diagnosis not present

## 2017-04-21 DIAGNOSIS — F410 Panic disorder [episodic paroxysmal anxiety] without agoraphobia: Secondary | ICD-10-CM | POA: Diagnosis not present

## 2017-05-13 ENCOUNTER — Other Ambulatory Visit: Payer: Self-pay | Admitting: Obstetrics and Gynecology

## 2017-05-13 ENCOUNTER — Encounter: Payer: Self-pay | Admitting: Physician Assistant

## 2017-05-13 ENCOUNTER — Other Ambulatory Visit: Payer: Self-pay

## 2017-05-13 ENCOUNTER — Ambulatory Visit (INDEPENDENT_AMBULATORY_CARE_PROVIDER_SITE_OTHER): Payer: 59 | Admitting: Physician Assistant

## 2017-05-13 ENCOUNTER — Ambulatory Visit (INDEPENDENT_AMBULATORY_CARE_PROVIDER_SITE_OTHER): Payer: 59 | Admitting: Obstetrics and Gynecology

## 2017-05-13 ENCOUNTER — Encounter: Payer: Self-pay | Admitting: Obstetrics and Gynecology

## 2017-05-13 ENCOUNTER — Ambulatory Visit: Payer: 59

## 2017-05-13 VITALS — BP 128/88 | Ht 66.0 in | Wt 192.0 lb

## 2017-05-13 VITALS — BP 154/96 | Temp 99.1°F | Resp 16 | Wt 194.0 lb

## 2017-05-13 DIAGNOSIS — Z124 Encounter for screening for malignant neoplasm of cervix: Secondary | ICD-10-CM | POA: Diagnosis not present

## 2017-05-13 DIAGNOSIS — R1013 Epigastric pain: Secondary | ICD-10-CM

## 2017-05-13 DIAGNOSIS — Z01419 Encounter for gynecological examination (general) (routine) without abnormal findings: Secondary | ICD-10-CM

## 2017-05-13 DIAGNOSIS — R110 Nausea: Secondary | ICD-10-CM

## 2017-05-13 DIAGNOSIS — Z1331 Encounter for screening for depression: Secondary | ICD-10-CM | POA: Diagnosis not present

## 2017-05-13 DIAGNOSIS — K852 Alcohol induced acute pancreatitis without necrosis or infection: Secondary | ICD-10-CM

## 2017-05-13 DIAGNOSIS — Z1339 Encounter for screening examination for other mental health and behavioral disorders: Secondary | ICD-10-CM

## 2017-05-13 MED ORDER — HYDROCODONE-ACETAMINOPHEN 10-325 MG PO TABS: 1.0000 | ORAL_TABLET | Freq: Three times a day (TID) | ORAL | 0 refills | Status: DC | PRN

## 2017-05-13 MED ORDER — ONDANSETRON HCL 4 MG PO TABS
4.0000 mg | ORAL_TABLET | Freq: Three times a day (TID) | ORAL | 1 refills | Status: DC | PRN
Start: 2017-05-13 — End: 2018-04-27

## 2017-05-13 NOTE — Progress Notes (Signed)
Patient: Ashley Suarez Female    DOB: 10-12-1985   31 y.o.   MRN: [id number] Visit Date: 05/13/2017  Today's Provider: Mar Daring, PA-C   Chief Complaint  Patient presents with  . Abdominal Pain   Subjective:    HPI Patient comes in today for abdominal pain. She reports that this has been going on since early this morning. She feels that her pain has gotten progressively worse as the day goes on. She has taken Ibuprofen 800mg  and zantac 150mg  with no relief.  She does have h/o pancreatitis due to alcohol consumption on 03/03/17. She has not had any alcohol since this time. She does report having chinese food for supper almost all last week up through [place]. She reports pain feels exactly the same as previously. She denies any nausea or vomiting at this time.     Allergies  Allergen Reactions  . Hydrocodone-Acetaminophen Nausea And Vomiting  . Tramadol Nausea And Vomiting    Also claims TREMORS     Current Outpatient Medications:  .  hydrOXYzine (VISTARIL) 25 MG capsule, , Disp: , Rfl: 0 .  lamoTRIgine (LAMICTAL) 200 MG tablet, Take 200 mg by mouth daily., Disp: , Rfl:  .  lamoTRIgine (LAMICTAL) 25 MG tablet, Take 1 tablet by mouth daily. Take 1 TABLET D FOR 2 WEEKS THEN TAKE 2 TABLETS D WITH BREAKFAST, Disp: , Rfl: 0 .  Multiple Vitamin (MULTI-VITAMINS) TABS, Take by mouth., Disp: , Rfl:  .  ranitidine (ZANTAC) 150 MG tablet, Take 150 mg by mouth 2 (two) times daily., Disp: , Rfl:  .  sertraline (ZOLOFT) 25 MG tablet, Take 25 mg by mouth daily., Disp: , Rfl: 0 .  ALPRAZolam (XANAX) 1 MG tablet, TAKE 1/2 TO 1 TABLET BY MOUTH 3 TIMES DAILY AS NEEDED (Patient not taking: Reported on 05/13/2017), Disp: 90 tablet, Rfl: 3 .  HYDROcodone-acetaminophen (NORCO) 10-325 MG tablet, Take 1 tablet by mouth every 8 (eight) hours as needed. (Patient not taking: Reported on 05/13/2017), Disp: 15 tablet, Rfl: 0  Review of Systems  Constitutional: Positive for activity  change, chills and fatigue.  Respiratory: Negative for cough, chest tightness and shortness of breath.   Cardiovascular: Negative for chest pain, palpitations and leg swelling.  Gastrointestinal: Positive for abdominal pain and nausea. Negative for abdominal distention, anal bleeding, blood in stool, constipation, diarrhea, rectal pain and vomiting.  Genitourinary: Negative for difficulty urinating, flank pain and frequency.  Musculoskeletal: Positive for myalgias.  Neurological: Positive for headaches. Negative for dizziness.    Social History   Tobacco Use  . Smoking status: Current Some Day Smoker    Packs/day: 0.50    Types: Cigarettes  . Smokeless tobacco: Never Used  Substance Use Topics  . Alcohol use: Yes    Alcohol/week: 0.0 oz   Objective:   BP (!) 154/96 (BP Location: Left Arm, Patient Position: Sitting, Cuff Size: Normal)   Temp 99.1 F (37.3 C)   Resp 16   Wt 194 lb (88 kg)   BMI 31.31 kg/m  Vitals:   05/13/17 1616  BP: (!) 154/96  Resp: 16  Temp: 99.1 F (37.3 C)  Weight: 194 lb (88 kg)     Physical Exam  Constitutional: She is oriented to person, place, and time. She appears well-developed and well-nourished. No distress.  Cardiovascular: Normal rate, regular rhythm and normal heart sounds. Exam reveals no gallop and no friction rub.  No murmur heard. Pulmonary/Chest: Effort normal and breath sounds  normal. No respiratory distress. She has no wheezes. She has no rales.  Abdominal: Soft. Normal appearance and bowel sounds are normal. She exhibits no distension and no mass. There is no hepatosplenomegaly. There is tenderness in the right upper quadrant and epigastric area. There is no rebound, no guarding and no CVA tenderness.  Neurological: She is alert and oriented to person, place, and time.  Skin: Skin is warm and dry. She is not diaphoretic.        Assessment & Plan:     1. Alcohol-induced acute pancreatitis without infection or necrosis Will  give Norco as below for pain control. Advised to go to clear liquid diet until pain subsides then increase diet as tolerated. Discussed to continue to abstain from alcohol. Discussed now she needs to also start to limit fatty foods to prevent future recurrence. Return precautions or reasons to go to ER were discussed. I will see her bck in 1 week. - HYDROcodone-acetaminophen (NORCO) 10-325 MG tablet; Take 1 tablet by mouth every 8 (eight) hours as needed.  Dispense: 30 tablet; Refill: 0  2. Nausea - ondansetron (ZOFRAN) 4 MG tablet; Take 1 tablet (4 mg total) by mouth every 8 (eight) hours as needed for nausea or vomiting.  Dispense: 30 tablet; Refill: [place], PA-C  [facility]

## 2017-05-13 NOTE — Addendum Note (Signed)
Addended by: Mar Daring on: 05/13/2017 02:38 PM   Modules accepted: Orders

## 2017-05-13 NOTE — Patient Instructions (Signed)
Acute Pancreatitis  Acute pancreatitis is a condition in which the pancreas suddenly becomes irritated and swollen (has inflammation). The pancreas is a gland that is located behind the stomach. It produces enzymes that help to digest food. The pancreas also releases the hormones glucagon and insulin, which help to regulate blood sugar. Damage to the pancreas occurs when the digestive enzymes from the pancreas are activated before they are released into the intestine.  Most acute attacks last a couple of days and can cause serious problems. Some people become dehydrated and develop low blood pressure. In severe cases, bleeding into the pancreas can lead to shock and can be life-threatening. The lungs, heart, and kidneys may fail.  What are the causes?  The most common causes of this condition are:  · Alcohol abuse.  · Gallstones.    Other causes include:  · Certain medicines.  · Exposure to certain chemicals.  · Infection.  · Damage caused by an accident (trauma).  · Abdominal surgery.    In some cases, the cause may not be known.  What are the signs or symptoms?  Symptoms of this condition include:  · Pain in the upper abdomen that may radiate to the back.  · Tenderness and swelling of the abdomen.  · Nausea and vomiting.    How is this diagnosed?  This condition may be diagnosed based on:  · A physical exam.  · Blood tests.  · Imaging tests, such as X-rays, CT scans, or an ultrasound of the abdomen.    How is this treated?  Treatment for this condition usually requires a stay in the hospital. Treatment may include:  · Pain medicine.  · Fluid replacement through an IV tube.  · Placing a tube in the stomach to remove stomach contents and to control vomiting (NG tube, or nasogastric tube).  · Not eating for 3-4 days. This gives the pancreas a rest, because enzymes are not being produced that can cause further damage.  · Antibiotic medicines, if your condition is caused by an infection.  · Surgery on the pancreas or  gallbladder.    Follow these instructions at home:  Eating and drinking  · Follow instructions from your health care provider about diet. This may involve avoiding alcohol and decreasing the amount of fat in your diet.  · Eat smaller, more frequent meals. This reduces the amount of digestive fluids that the pancreas produces.  · Drink enough fluid to keep your urine clear or pale yellow.  · Do not drink alcohol if it caused your condition.  General instructions  · Take over-the-counter and prescription medicines only as told by your health care provider.  · Do not use any tobacco products, such as cigarettes, chewing tobacco, and e-cigarettes. If you need help quitting, ask your health care provider.  · Get plenty of rest.  · If directed, check your blood sugar at home as told by your health care provider.  · Keep all follow-up visits as told by your health care provider. This is important.  Contact a health care provider if:  · You do not recover as quickly as expected.  · You develop new or worsening symptoms.  · You have persistent pain, weakness, or nausea.  · You recover and then have another episode of pain.  · You have a fever.  Get help right away if:  · You cannot eat or keep fluids down.  · Your pain becomes severe.  · Your skin or the   white part of your eyes turns yellow (jaundice).  · You vomit.  · You feel dizzy or you faint.  · Your blood sugar is high (over 300 mg/dL).  This information is not intended to replace advice given to you by your health care provider. Make sure you discuss any questions you have with your health care provider.  Document Released: 05/03/2005 Document Revised: 09/10/2015 Document Reviewed: 02/04/2015  Elsevier Interactive Patient Education © 2018 [employer] Inc[employer]
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
## 2017-05-14 LAB — CBC WITH DIFFERENTIAL/PLATELET
BASOS ABS: 0 10*3/uL (ref 0.0–0.2)
Basos: 0 %
EOS (ABSOLUTE): 0.1 10*3/uL (ref 0.0–0.4)
Eos: 1 %
Hematocrit: 40.1 % (ref 34.0–46.6)
Hemoglobin: 13.7 g/dL (ref 11.1–15.9)
Immature Grans (Abs): 0 10*3/uL (ref 0.0–0.1)
Immature Granulocytes: 0 %
LYMPHS ABS: 2.9 10*3/uL (ref 0.7–3.1)
Lymphs: 30 %
MCH: 33.2 pg — ABNORMAL HIGH (ref 26.6–33.0)
MCHC: 34.2 g/dL (ref 31.5–35.7)
MCV: 97 fL (ref 79–97)
MONOS ABS: 0.5 10*3/uL (ref 0.1–0.9)
Monocytes: 5 %
NEUTROS PCT: 64 %
Neutrophils Absolute: 6.3 10*3/uL (ref 1.4–7.0)
PLATELETS: 306 10*3/uL (ref 150–379)
RBC: 4.13 x10E6/uL (ref 3.77–5.28)
RDW: 12.2 % — AB (ref 12.3–15.4)
WBC: 9.8 10*3/uL (ref 3.4–10.8)

## 2017-05-14 LAB — COMPREHENSIVE METABOLIC PANEL
ALBUMIN: 4.6 g/dL (ref 3.5–5.5)
ALK PHOS: 66 IU/L (ref 39–117)
ALT: 19 IU/L (ref 0–32)
AST: 17 IU/L (ref 0–40)
Albumin/Globulin Ratio: 1.7 (ref 1.2–2.2)
BUN / CREAT RATIO: 15 (ref 9–23)
BUN: 12 mg/dL (ref 6–20)
CHLORIDE: 101 mmol/L (ref 96–106)
CO2: 23 mmol/L (ref 20–29)
Calcium: 9.4 mg/dL (ref 8.7–10.2)
Creatinine, Ser: 0.79 mg/dL (ref 0.57–1.00)
GFR calc Af Amer: 114 mL/min/{1.73_m2} (ref >59)
GFR calc non Af Amer: 99 mL/min/{1.73_m2} (ref >59)
GLUCOSE: 93 mg/dL (ref 65–99)
Globulin, Total: 2.7 g/dL (ref 1.5–4.5)
Potassium: 4.2 mmol/L (ref 3.5–5.2)
Sodium: 141 mmol/L (ref 134–144)
Total Protein: 7.3 g/dL (ref 6.0–8.5)

## 2017-05-14 LAB — LIPASE: LIPASE: 38 U/L (ref 14–72)

## 2017-05-16 ENCOUNTER — Encounter: Payer: Self-pay | Admitting: Physician Assistant

## 2017-05-16 LAB — IGP, APTIMA HPV, RFX 16/18,45
HPV Aptima: NEGATIVE
PAP SMEAR COMMENT: 0

## 2017-05-18 ENCOUNTER — Ambulatory Visit: Payer: Self-pay | Admitting: Obstetrics and Gynecology

## 2017-05-18 ENCOUNTER — Other Ambulatory Visit: Payer: Self-pay | Admitting: Obstetrics and Gynecology

## 2017-05-18 DIAGNOSIS — F411 Generalized anxiety disorder: Secondary | ICD-10-CM | POA: Diagnosis not present

## 2017-05-18 DIAGNOSIS — Z79899 Other long term (current) drug therapy: Secondary | ICD-10-CM | POA: Diagnosis not present

## 2017-05-18 DIAGNOSIS — F319 Bipolar disorder, unspecified: Secondary | ICD-10-CM | POA: Diagnosis not present

## 2017-05-18 DIAGNOSIS — R1011 Right upper quadrant pain: Secondary | ICD-10-CM

## 2017-05-18 DIAGNOSIS — F5105 Insomnia due to other mental disorder: Secondary | ICD-10-CM | POA: Diagnosis not present

## 2017-05-18 DIAGNOSIS — F1010 Alcohol abuse, uncomplicated: Secondary | ICD-10-CM | POA: Diagnosis not present

## 2017-05-18 DIAGNOSIS — F410 Panic disorder [episodic paroxysmal anxiety] without agoraphobia: Secondary | ICD-10-CM | POA: Diagnosis not present

## 2017-05-19 ENCOUNTER — Ambulatory Visit
Admission: RE | Admit: 2017-05-19 | Discharge: 2017-05-19 | Disposition: A | Discharge status: Home / Self Care | Payer: 59 | Source: Ambulatory Visit | Attending: Obstetrics and Gynecology | Admitting: Obstetrics and Gynecology

## 2017-05-19 ENCOUNTER — Other Ambulatory Visit: Payer: Self-pay | Admitting: Obstetrics and Gynecology

## 2017-05-19 DIAGNOSIS — F4310 Post-traumatic stress disorder, unspecified: Secondary | ICD-10-CM | POA: Diagnosis not present

## 2017-05-19 DIAGNOSIS — R1011 Right upper quadrant pain: Secondary | ICD-10-CM | POA: Insufficient documentation

## 2017-05-19 DIAGNOSIS — F1010 Alcohol abuse, uncomplicated: Secondary | ICD-10-CM | POA: Diagnosis not present

## 2017-05-19 DIAGNOSIS — K859 Acute pancreatitis without necrosis or infection, unspecified: Secondary | ICD-10-CM

## 2017-05-19 DIAGNOSIS — R932 Abnormal findings on diagnostic imaging of liver and biliary tract: Secondary | ICD-10-CM | POA: Insufficient documentation

## 2017-05-19 DIAGNOSIS — K2980 Duodenitis without bleeding: Secondary | ICD-10-CM

## 2017-05-20 ENCOUNTER — Other Ambulatory Visit: Payer: 59

## 2017-05-26 ENCOUNTER — Encounter: Payer: Self-pay | Admitting: Physician Assistant

## 2017-06-02 DIAGNOSIS — F4310 Post-traumatic stress disorder, unspecified: Secondary | ICD-10-CM | POA: Diagnosis not present

## 2017-06-02 DIAGNOSIS — F1010 Alcohol abuse, uncomplicated: Secondary | ICD-10-CM | POA: Diagnosis not present

## 2017-06-03 DIAGNOSIS — F410 Panic disorder [episodic paroxysmal anxiety] without agoraphobia: Secondary | ICD-10-CM | POA: Diagnosis not present

## 2017-06-03 DIAGNOSIS — F5105 Insomnia due to other mental disorder: Secondary | ICD-10-CM | POA: Diagnosis not present

## 2017-06-03 DIAGNOSIS — F411 Generalized anxiety disorder: Secondary | ICD-10-CM | POA: Diagnosis not present

## 2017-06-03 DIAGNOSIS — F319 Bipolar disorder, unspecified: Secondary | ICD-10-CM | POA: Diagnosis not present

## 2017-06-03 DIAGNOSIS — F1011 Alcohol abuse, in remission: Secondary | ICD-10-CM | POA: Diagnosis not present

## 2017-06-03 DIAGNOSIS — Z79899 Other long term (current) drug therapy: Secondary | ICD-10-CM | POA: Diagnosis not present

## 2017-06-15 ENCOUNTER — Ambulatory Visit: Payer: 59 | Admitting: Gastroenterology

## 2017-06-23 DIAGNOSIS — F1010 Alcohol abuse, uncomplicated: Secondary | ICD-10-CM | POA: Diagnosis not present

## 2017-06-23 DIAGNOSIS — F4310 Post-traumatic stress disorder, unspecified: Secondary | ICD-10-CM | POA: Diagnosis not present

## 2017-06-28 ENCOUNTER — Ambulatory Visit: Payer: 59 | Admitting: Gastroenterology

## 2017-07-20 DIAGNOSIS — F5105 Insomnia due to other mental disorder: Secondary | ICD-10-CM | POA: Diagnosis not present

## 2017-07-20 DIAGNOSIS — F1011 Alcohol abuse, in remission: Secondary | ICD-10-CM | POA: Diagnosis not present

## 2017-07-20 DIAGNOSIS — F410 Panic disorder [episodic paroxysmal anxiety] without agoraphobia: Secondary | ICD-10-CM | POA: Diagnosis not present

## 2017-07-20 DIAGNOSIS — F411 Generalized anxiety disorder: Secondary | ICD-10-CM | POA: Diagnosis not present

## 2017-07-20 DIAGNOSIS — F319 Bipolar disorder, unspecified: Secondary | ICD-10-CM | POA: Diagnosis not present

## 2017-08-09 ENCOUNTER — Encounter: Payer: Self-pay | Admitting: Physician Assistant

## 2017-08-09 DIAGNOSIS — J301 Allergic rhinitis due to pollen: Secondary | ICD-10-CM

## 2017-08-09 MED ORDER — FLUTICASONE PROPIONATE 50 MCG/ACT NA SUSP: 2.0000 | Freq: Every day | NASAL | 1 refills | Status: DC

## 2017-08-11 DIAGNOSIS — F4310 Post-traumatic stress disorder, unspecified: Secondary | ICD-10-CM | POA: Diagnosis not present

## 2017-08-11 DIAGNOSIS — F1010 Alcohol abuse, uncomplicated: Secondary | ICD-10-CM | POA: Diagnosis not present

## 2017-08-18 DIAGNOSIS — F411 Generalized anxiety disorder: Secondary | ICD-10-CM | POA: Diagnosis not present

## 2017-08-18 DIAGNOSIS — F1011 Alcohol abuse, in remission: Secondary | ICD-10-CM | POA: Diagnosis not present

## 2017-08-18 DIAGNOSIS — F5105 Insomnia due to other mental disorder: Secondary | ICD-10-CM | POA: Diagnosis not present

## 2017-08-18 DIAGNOSIS — F410 Panic disorder [episodic paroxysmal anxiety] without agoraphobia: Secondary | ICD-10-CM | POA: Diagnosis not present

## 2017-08-18 DIAGNOSIS — F319 Bipolar disorder, unspecified: Secondary | ICD-10-CM | POA: Diagnosis not present

## 2017-08-22 NOTE — Telephone Encounter (Signed)
Faxed 02/18/17 - OV 11/30/16, Labs 03/24/16 medication List CC

## 2017-09-15 DIAGNOSIS — F4310 Post-traumatic stress disorder, unspecified: Secondary | ICD-10-CM | POA: Diagnosis not present

## 2017-09-15 DIAGNOSIS — F1010 Alcohol abuse, uncomplicated: Secondary | ICD-10-CM | POA: Diagnosis not present

## 2017-10-28 ENCOUNTER — Other Ambulatory Visit: Payer: Self-pay | Admitting: Obstetrics and Gynecology

## 2017-10-28 DIAGNOSIS — Z131 Encounter for screening for diabetes mellitus: Secondary | ICD-10-CM

## 2017-10-28 DIAGNOSIS — F419 Anxiety disorder, unspecified: Secondary | ICD-10-CM

## 2017-10-28 DIAGNOSIS — F319 Bipolar disorder, unspecified: Secondary | ICD-10-CM

## 2017-10-28 DIAGNOSIS — Z13220 Encounter for screening for lipoid disorders: Secondary | ICD-10-CM

## 2017-11-07 ENCOUNTER — Other Ambulatory Visit: Payer: Self-pay | Admitting: Physician Assistant

## 2017-11-07 ENCOUNTER — Other Ambulatory Visit: Payer: 59

## 2017-11-07 DIAGNOSIS — F411 Generalized anxiety disorder: Secondary | ICD-10-CM | POA: Diagnosis not present

## 2017-11-07 DIAGNOSIS — Z131 Encounter for screening for diabetes mellitus: Secondary | ICD-10-CM | POA: Diagnosis not present

## 2017-11-07 DIAGNOSIS — F5105 Insomnia due to other mental disorder: Secondary | ICD-10-CM | POA: Diagnosis not present

## 2017-11-07 DIAGNOSIS — Z13220 Encounter for screening for lipoid disorders: Secondary | ICD-10-CM | POA: Diagnosis not present

## 2017-11-07 DIAGNOSIS — F319 Bipolar disorder, unspecified: Secondary | ICD-10-CM

## 2017-11-07 DIAGNOSIS — F419 Anxiety disorder, unspecified: Secondary | ICD-10-CM | POA: Diagnosis not present

## 2017-11-07 DIAGNOSIS — F410 Panic disorder [episodic paroxysmal anxiety] without agoraphobia: Secondary | ICD-10-CM | POA: Diagnosis not present

## 2017-11-07 NOTE — Progress Notes (Signed)
Updated med list

## 2017-11-08 ENCOUNTER — Encounter: Payer: Self-pay | Admitting: Physician Assistant

## 2017-11-08 ENCOUNTER — Ambulatory Visit: Payer: 59 | Admitting: Physician Assistant

## 2017-11-08 VITALS — BP 130/80 | HR 88 | Temp 98.5°F | Resp 16 | Wt 204.2 lb

## 2017-11-08 DIAGNOSIS — M5441 Lumbago with sciatica, right side: Secondary | ICD-10-CM

## 2017-11-08 DIAGNOSIS — M62838 Other muscle spasm: Secondary | ICD-10-CM

## 2017-11-08 LAB — LIPID PANEL WITH LDL/HDL RATIO
CHOLESTEROL TOTAL: 199 mg/dL (ref 100–199)
HDL: 42 mg/dL (ref >39)
LDL CALC: 123 mg/dL — AB (ref 0–99)
LDL/HDL RATIO: 2.9 ratio (ref 0.0–3.2)
Triglycerides: 170 mg/dL — ABNORMAL HIGH (ref 0–149)
VLDL Cholesterol Cal: 34 mg/dL (ref 5–40)

## 2017-11-08 LAB — HGB A1C W/O EAG: HEMOGLOBIN A1C: 5.1 % (ref 4.8–5.6)

## 2017-11-08 MED ORDER — BACLOFEN 20 MG PO TABS: 20.0000 mg | ORAL_TABLET | Freq: Three times a day (TID) | ORAL | 0 refills | Status: DC

## 2017-11-08 MED ORDER — CYCLOBENZAPRINE HCL 5 MG PO TABS: 5.0000 mg | ORAL_TABLET | Freq: Three times a day (TID) | ORAL | 1 refills | Status: DC | PRN

## 2017-11-08 MED ORDER — METHYLPREDNISOLONE 4 MG PO TBPK: ORAL_TABLET | ORAL | 0 refills | Status: DC

## 2017-11-08 NOTE — Progress Notes (Signed)
Patient: Ashley Suarez Female    DOB: Sep 29, 1985   32 y.o.   MRN: [id number] Visit Date: 11/08/2017  Today's Provider: Mar Daring, PA-C   Chief Complaint  Patient presents with  . Back Pain   Subjective:    Back Pain  This is a new problem. The current episode started in the past 7 days (on Friday). The problem occurs constantly. The problem has been gradually worsening since onset. The pain is present in the lumbar spine. The pain radiates to the right thigh and right foot. The pain is at a severity of 7/10. The pain is moderate. The pain is worse during the day. The symptoms are aggravated by sitting, position, standing and bending. Associated symptoms include leg pain ("right leg") and pelvic pain (back pain radiates around to front of pelvis). Pertinent negatives include no abdominal pain, bladder incontinence, bowel incontinence, numbness, tingling or weakness. She has tried heat, ice and NSAIDs (Epsom salt bath. Tylenol, IBU) for the symptoms. The treatment provided no relief.      Allergies  Allergen Reactions  . Hydrocodone-Acetaminophen Nausea And Vomiting  . Tramadol Nausea And Vomiting    Also claims TREMORS     Current Outpatient Medications:  Marland Kitchen  Multiple Vitamin (MULTI-VITAMINS) TABS, Take by mouth., Disp: , Rfl:  .  ziprasidone (GEODON) 20 MG capsule, Take 1 capsule (20 mg total) by mouth 2 (two) times daily with a meal., Disp: 90 capsule, Rfl: 1 .  fluticasone (FLONASE) 50 MCG/ACT nasal spray, Place 2 sprays into both nostrils daily. (Patient not taking: Reported on 11/08/2017), Disp: 48 g, Rfl: 1 .  ondansetron (ZOFRAN) 4 MG tablet, Take 1 tablet (4 mg total) by mouth every 8 (eight) hours as needed for nausea or vomiting. (Patient not taking: Reported on 11/08/2017), Disp: 30 tablet, Rfl: 1 .  ranitidine (ZANTAC) 150 MG tablet, Take 150 mg by mouth 2 (two) times daily., Disp: , Rfl:   Review of Systems  Constitutional: Negative.   Respiratory:  Negative.   Cardiovascular: Negative.   Gastrointestinal: Negative for abdominal pain and bowel incontinence.  Genitourinary: Positive for pelvic pain (back pain radiates around to front of pelvis). Negative for bladder incontinence.  Musculoskeletal: Positive for back pain.  Neurological: Negative for tingling, weakness and numbness.    Social History   Tobacco Use  . Smoking status: Current Some Day Smoker    Packs/day: 0.50    Types: Cigarettes  . Smokeless tobacco: Never Used  Substance Use Topics  . Alcohol use: Yes    Alcohol/week: 0.0 oz   Objective:   BP 130/80 (BP Location: Left Arm, Patient Position: Sitting, Cuff Size: Normal)   Pulse 88   Temp 98.5 F (36.9 C) (Oral)   Resp 16   Wt 204 lb 3.2 oz (92.6 kg)   SpO2 99%   BMI 32.96 kg/m    Physical Exam  Constitutional: She appears well-developed and well-nourished. No distress.  Neck: Normal range of motion. Neck supple.  Cardiovascular: Normal rate, regular rhythm and normal heart sounds. Exam reveals no gallop and no friction rub.  No murmur heard. Pulmonary/Chest: Effort normal and breath sounds normal. No respiratory distress. She has no wheezes. She has no rales.  Musculoskeletal:       Thoracic back: She exhibits decreased range of motion, tenderness and spasm. She exhibits no bony tenderness.       Lumbar back: She exhibits decreased range of motion, tenderness, bony tenderness and spasm.  Neg SLR  Skin: She is not diaphoretic.  Vitals reviewed.     Assessment & Plan:     1. Acute bilateral low back pain with right-sided sciatica Worsening despite conservative OTC management. Will give medrol dose pak and baclofen as below for spasm and strain of low back. She is to call if no improvements and will get lumbar xray if not improving.  - methylPREDNISolone (MEDROL DOSEPAK) 4 MG TBPK tablet; 6 day taper; take as directed on package instructions  Dispense: 21 tablet; Refill: 0  2. Muscle spasm See above  medical treatment plan. - baclofen (LIORESAL) 20 MG tablet; Take 1 tablet (20 mg total) by mouth 3 (three) times daily.  Dispense: 30 each; Refill: 0       Mar Daring, PA-C  [facility]

## 2017-11-08 NOTE — Patient Instructions (Signed)
Low Back Strain Rehab  Ask your health care provider which exercises are safe for you. Do exercises exactly as told by your health care provider and adjust them as directed. It is normal to feel mild stretching, pulling, tightness, or discomfort as you do these exercises, but you should stop right away if you feel sudden pain or your pain gets worse. Do not begin these exercises until told by your health care provider.  Stretching and range of motion exercises  These exercises warm up your muscles and joints and improve the movement and flexibility of your back. These exercises also help to relieve pain, numbness, and tingling.  Exercise A: Single knee to chest    1. Lie on your back on a firm surface with both legs straight.  2. Bend one of your knees. Use your hands to move your knee up toward your chest until you feel a gentle stretch in your lower back and buttock.  ? Hold your leg in this position by holding onto the front of your knee.  ? Keep your other leg as straight as possible.  3. Hold for __________ seconds.  4. Slowly return to the starting position.  5. Repeat with your other leg.  Repeat __________ times. Complete this exercise __________ times a day.  Exercise B: Prone extension on elbows    1. Lie on your abdomen on a firm surface.  2. Prop yourself up on your elbows.  3. Use your arms to help lift your chest up until you feel a gentle stretch in your abdomen and your lower back.  ? This will place some of your body weight on your elbows. If this is uncomfortable, try stacking pillows under your chest.  ? Your hips should stay down, against the surface that you are lying on. Keep your hip and back muscles relaxed.  4. Hold for __________ seconds.  5. Slowly relax your upper body and return to the starting position.  Repeat __________ times. Complete this exercise __________ times a day.  Strengthening exercises  These exercises build strength and endurance in your back. Endurance is the ability to  use your muscles for a long time, even after they get tired.  Exercise C: Pelvic tilt  1. Lie on your back on a firm surface. Bend your knees and keep your feet flat.  2. Tense your abdominal muscles. Tip your pelvis up toward the ceiling and flatten your lower back into the floor.  ? To help with this exercise, you may place a small towel under your lower back and try to push your back into the towel.  3. Hold for __________ seconds.  4. Let your muscles relax completely before you repeat this exercise.  Repeat __________ times. Complete this exercise __________ times a day.  Exercise D: Alternating arm and leg raises    1. Get on your hands and knees on a firm surface. If you are on a hard floor, you may want to use padding to cushion your knees, such as an exercise mat.  2. Line up your arms and legs. Your hands should be below your shoulders, and your knees should be below your hips.  3. Lift your left leg behind you. At the same time, raise your right arm and straighten it in front of you.  ? Do not lift your leg higher than your hip.  ? Do not lift your arm higher than your shoulder.  ? Keep your abdominal and back muscles tight.  ?   Keep your hips facing the ground.  ? Do not arch your back.  ? Keep your balance carefully, and do not hold your breath.  4. Hold for __________ seconds.  5. Slowly return to the starting position and repeat with your right leg and your left arm.  Repeat __________ times. Complete this exercise __________times a day.  Exercise J: Single leg lower with bent knees  1. Lie on your back on a firm surface.  2. Tense your abdominal muscles and lift your feet off the floor, one foot at a time, so your knees and hips are bent in an "L" shape (at about 90 degrees).  ? Your knees should be over your hips and your lower legs should be parallel to the floor.  3. Keeping your abdominal muscles tense and your knee bent, slowly lower one of your legs so your toe touches the ground.  4. Lift your  leg back up to return to the starting position.  ? Do not hold your breath.  ? Do not let your back arch. Keep your back flat against the ground.  5. Repeat with your other leg.  Repeat __________ times. Complete this exercise __________ times a day.  Posture and body mechanics    Body mechanics refers to the movements and positions of your body while you do your daily activities. Posture is part of body mechanics. Good posture and healthy body mechanics can help to relieve stress in your body's tissues and joints. Good posture means that your spine is in its natural S-curve position (your spine is neutral), your shoulders are pulled back slightly, and your head is not tipped forward. The following are general guidelines for applying improved posture and body mechanics to your everyday activities.  Standing     When standing, keep your spine neutral and your feet about hip-width apart. Keep a slight bend in your knees. Your ears, shoulders, and hips should line up.   When you do a task in which you stand in one place for a long time, place one foot up on a stable object that is 2-4 inches (5-10 cm) high, such as a footstool. This helps keep your spine neutral.  Sitting     When sitting, keep your spine neutral and keep your feet flat on the floor. Use a footrest, if necessary, and keep your thighs parallel to the floor. Avoid rounding your shoulders, and avoid tilting your head forward.   When working at a desk or a computer, keep your desk at a height where your hands are slightly lower than your elbows. Slide your chair under your desk so you are close enough to maintain good posture.   When working at a computer, place your monitor at a height where you are looking straight ahead and you do not have to tilt your head forward or downward to look at the screen.  Resting     When lying down and resting, avoid positions that are most painful for you.   If you have pain with activities such as sitting, bending,  stooping, or squatting (flexion-based activities), lie in a position in which your body does not bend very much. For example, avoid curling up on your side with your arms and knees near your chest (fetal position).   If you have pain with activities such as standing for a long time or reaching with your arms (extension-based activities), lie with your spine in a neutral position and bend your knees slightly. Try the   following positions:  ? Lying on your side with a pillow between your knees.  ? Lying on your back with a pillow under your knees.  Lifting     When lifting objects, keep your feet at least shoulder-width apart and tighten your abdominal muscles.   Bend your knees and hips and keep your spine neutral. It is important to lift using the strength of your legs, not your back. Do not lock your knees straight out.   Always ask for help to lift heavy or awkward objects.  This information is not intended to replace advice given to you by your health care provider. Make sure you discuss any questions you have with your health care provider.  Document Released: 05/03/2005 Document Revised: 01/08/2016 Document Reviewed: 02/12/2015  Elsevier Interactive Patient Education  2018 [employer]

## 2017-11-11 ENCOUNTER — Encounter: Payer: Self-pay | Admitting: Physician Assistant

## 2017-11-11 DIAGNOSIS — M545 Low back pain, unspecified: Secondary | ICD-10-CM

## 2017-11-11 MED ORDER — MELOXICAM 15 MG PO TABS: 15.0000 mg | ORAL_TABLET | Freq: Every day | ORAL | 0 refills | Status: DC

## 2017-12-07 ENCOUNTER — Telehealth: Payer: Self-pay | Admitting: Obstetrics and Gynecology

## 2017-12-07 MED ORDER — MOXIFLOXACIN HCL 400 MG PO TABS: 400.0000 mg | ORAL_TABLET | Freq: Every day | ORAL | 0 refills | Status: AC

## 2017-12-07 NOTE — Telephone Encounter (Signed)
Pt with mild vag odor, burning, irritation. Has had sx in past. Neg wet prep. Question AV. Rx avelox. F/u prn.

## 2018-01-18 ENCOUNTER — Encounter: Payer: Self-pay | Admitting: Physician Assistant

## 2018-02-02 DIAGNOSIS — F1010 Alcohol abuse, uncomplicated: Secondary | ICD-10-CM | POA: Diagnosis not present

## 2018-02-02 DIAGNOSIS — F4310 Post-traumatic stress disorder, unspecified: Secondary | ICD-10-CM | POA: Diagnosis not present

## 2018-02-06 ENCOUNTER — Encounter: Payer: Self-pay | Admitting: Physician Assistant

## 2018-03-20 ENCOUNTER — Ambulatory Visit
Admission: RE | Admit: 2018-03-20 | Discharge: 2018-03-20 | Disposition: A | Discharge status: Home / Self Care | Payer: 59 | Source: Ambulatory Visit | Attending: Physician Assistant | Admitting: Physician Assistant

## 2018-03-20 ENCOUNTER — Encounter: Payer: Self-pay | Admitting: Physician Assistant

## 2018-03-20 ENCOUNTER — Ambulatory Visit: Payer: 59 | Admitting: Physician Assistant

## 2018-03-20 VITALS — BP 158/82 | HR 120 | Temp 98.4°F | Resp 16 | Wt 192.4 lb

## 2018-03-20 DIAGNOSIS — M5441 Lumbago with sciatica, right side: Secondary | ICD-10-CM | POA: Diagnosis not present

## 2018-03-20 DIAGNOSIS — M5412 Radiculopathy, cervical region: Secondary | ICD-10-CM

## 2018-03-20 DIAGNOSIS — M5442 Lumbago with sciatica, left side: Secondary | ICD-10-CM

## 2018-03-20 DIAGNOSIS — M545 Low back pain: Secondary | ICD-10-CM | POA: Diagnosis not present

## 2018-03-20 DIAGNOSIS — M5136 Other intervertebral disc degeneration, lumbar region: Secondary | ICD-10-CM | POA: Insufficient documentation

## 2018-03-20 DIAGNOSIS — M542 Cervicalgia: Secondary | ICD-10-CM | POA: Diagnosis not present

## 2018-03-20 DIAGNOSIS — G8929 Other chronic pain: Secondary | ICD-10-CM

## 2018-03-20 MED ORDER — BACLOFEN 20 MG PO TABS: 20.0000 mg | ORAL_TABLET | Freq: Three times a day (TID) | ORAL | 1 refills | Status: DC

## 2018-03-20 MED ORDER — METHYLPREDNISOLONE 4 MG PO TBPK: ORAL_TABLET | ORAL | 0 refills | Status: DC

## 2018-03-20 NOTE — Patient Instructions (Signed)
Sciatica Rehab  Ask your health care provider which exercises are safe for you. Do exercises exactly as told by your health care provider and adjust them as directed. It is normal to feel mild stretching, pulling, tightness, or discomfort as you do these exercises, but you should stop right away if you feel sudden pain or your pain gets worse. Do not begin these exercises until told by your health care provider.  Stretching and range of motion exercises  These exercises warm up your muscles and joints and improve the movement and flexibility of your hips and your back. These exercises also help to relieve pain, numbness, and tingling.  Exercise A: Sciatic nerve glide  1. Sit in a chair with your head facing down toward your chest. Place your hands behind your back. Let your shoulders slump forward.  2. Slowly straighten one of your knees while you tilt your head back as if you are looking toward the ceiling. Only straighten your leg as far as you can without making your symptoms worse.  3. Hold for __________ seconds.  4. Slowly return to the starting position.  5. Repeat with your other leg.  Repeat __________ times. Complete this exercise __________ times a day.  Exercise B: Knee to chest with hip adduction and internal rotation    1. Lie on your back on a firm surface with both legs straight.  2. Bend one of your knees and move it up toward your chest until you feel a gentle stretch in your lower back and buttock. Then, move your knee toward the shoulder that is on the opposite side from your leg.  ? Hold your leg in this position by holding onto the front of your knee.  3. Hold for __________ seconds.  4. Slowly return to the starting position.  5. Repeat with your other leg.  Repeat __________ times. Complete this exercise __________ times a day.  Exercise C: Prone extension on elbows    1. Lie on your abdomen on a firm surface. A bed may be too soft for this exercise.  2. Prop yourself up on your  elbows.  3. Use your arms to help lift your chest up until you feel a gentle stretch in your abdomen and your lower back.  ? This will place some of your body weight on your elbows. If this is uncomfortable, try stacking pillows under your chest.  ? Your hips should stay down, against the surface that you are lying on. Keep your hip and back muscles relaxed.  4. Hold for __________ seconds.  5. Slowly relax your upper body and return to the starting position.  Repeat __________ times. Complete this exercise __________ times a day.  Strengthening exercises  These exercises build strength and endurance in your back. Endurance is the ability to use your muscles for a long time, even after they get tired.  Exercise D: Pelvic tilt  1. Lie on your back on a firm surface. Bend your knees and keep your feet flat.  2. Tense your abdominal muscles. Tip your pelvis up toward the ceiling and flatten your lower back into the floor.  ? To help with this exercise, you may place a small towel under your lower back and try to push your back into the towel.  3. Hold for __________ seconds.  4. Let your muscles relax completely before you repeat this exercise.  Repeat __________ times. Complete this exercise __________ times a day.  Exercise E: Alternating arm and leg raises      1. Get on your hands and knees on a firm surface. If you are on a hard floor, you may want to use padding to cushion your knees, such as an exercise mat.  2. Line up your arms and legs. Your hands should be below your shoulders, and your knees should be below your hips.  3. Lift your left leg behind you. At the same time, raise your right arm and straighten it in front of you.  ? Do not lift your leg higher than your hip.  ? Do not lift your arm higher than your shoulder.  ? Keep your abdominal and back muscles tight.  ? Keep your hips facing the ground.  ? Do not arch your back.  ? Keep your balance carefully, and do not hold your breath.  4. Hold for  __________ seconds.  5. Slowly return to the starting position and repeat with your right leg and your left arm.  Repeat __________ times. Complete this exercise __________ times a day.  Posture and body mechanics    Body mechanics refers to the movements and positions of your body while you do your daily activities. Posture is part of body mechanics. Good posture and healthy body mechanics can help to relieve stress in your body's tissues and joints. Good posture means that your spine is in its natural S-curve position (your spine is neutral), your shoulders are pulled back slightly, and your head is not tipped forward. The following are general guidelines for applying improved posture and body mechanics to your everyday activities.  Standing    · When standing, keep your spine neutral and your feet about hip-width apart. Keep a slight bend in your knees. Your ears, shoulders, and hips should line up.  · When you do a task in which you stand in one place for a long time, place one foot up on a stable object that is 2-4 inches (5-10 cm) high, such as a footstool. This helps keep your spine neutral.  Sitting    · When sitting, keep your spine neutral and keep your feet flat on the floor. Use a footrest, if necessary, and keep your thighs parallel to the floor. Avoid rounding your shoulders, and avoid tilting your head forward.  · When working at a desk or a computer, keep your desk at a height where your hands are slightly lower than your elbows. Slide your chair under your desk so you are close enough to maintain good posture.  · When working at a computer, place your monitor at a height where you are looking straight ahead and you do not have to tilt your head forward or downward to look at the screen.  Resting    · When lying down and resting, avoid positions that are most painful for you.  · If you have pain with activities such as sitting, bending, stooping, or squatting (flexion-based activities), lie in a  position in which your body does not bend very much. For example, avoid curling up on your side with your arms and knees near your chest (fetal position).  · If you have pain with activities such as standing for a long time or reaching with your arms (extension-based activities), lie with your spine in a neutral position and bend your knees slightly. Try the following positions:  ? Lying on your side with a pillow between your knees.  ? Lying on your back with a pillow under your knees.  Lifting    · When lifting   objects, keep your feet at least shoulder-width apart and tighten your abdominal muscles.  · Bend your knees and hips and keep your spine neutral. It is important to lift using the strength of your legs, not your back. Do not lock your knees straight out.  · Always ask for help to lift heavy or awkward objects.  This information is not intended to replace advice given to you by your health care provider. Make sure you discuss any questions you have with your health care provider.  Document Released: 05/03/2005 Document Revised: 01/08/2016 Document Reviewed: 01/17/2015  Elsevier Interactive Patient Education © 2018 [employer]vier Inc.

## 2018-03-20 NOTE — Progress Notes (Signed)
Patient: Ashley Suarez Female    DOB: 16-May-1986   32 y.o.   MRN: [id number] Visit Date: 03/20/2018  Today's Provider: Mar Daring, PA-C   Chief Complaint  Patient presents with  . Back Pain   Subjective:    HPI Patient here today with c/o sciatica pain/low back pain. Started one month ago. She does have this issue chronically with periodic flares. Last flare was in June 2019. Pain is located on both sides. Right side and left sides.Pain radiates to both legs.Pain reports her pain is dull and mild-moderate. Treatments tried: IBU,cold packs, hot packs, icy hot.  She is also having upper back pain with having a pins and needles sensation down to her 3rd-5th fingers bilaterally. She has never had this before.     Allergies  Allergen Reactions  . Hydrocodone-Acetaminophen Nausea And Vomiting  . Tramadol Nausea And Vomiting    Also claims TREMORS     Current Outpatient Medications:  .  fluticasone (FLONASE) 50 MCG/ACT nasal spray, Place 2 sprays into both nostrils daily., Disp: 48 g, Rfl: 1 .  Multiple Vitamin (MULTI-VITAMINS) TABS, Take by mouth., Disp: , Rfl:  .  meloxicam (MOBIC) 15 MG tablet, Take 1 tablet (15 mg total) by mouth daily. (Patient not taking: Reported on 03/20/2018), Disp: 30 tablet, Rfl: 0 .  ondansetron (ZOFRAN) 4 MG tablet, Take 1 tablet (4 mg total) by mouth every 8 (eight) hours as needed for nausea or vomiting. (Patient not taking: Reported on 11/08/2017), Disp: 30 tablet, Rfl: 1  Review of Systems  Constitutional: Negative.   HENT: Negative.   Respiratory: Negative.   Cardiovascular: Negative.   Gastrointestinal: Negative.   Musculoskeletal: Positive for back pain, gait problem (antalgic) and myalgias.  Neurological: Positive for weakness and numbness.    Social History   Tobacco Use  . Smoking status: Current Some Day Smoker    Packs/day: 0.50    Types: Cigarettes  . Smokeless tobacco: Never Used  Substance Use Topics  . Alcohol  use: Yes    Alcohol/week: 0.0 standard drinks   Objective:   BP (!) 158/82 (BP Location: Left Arm, Patient Position: Sitting, Cuff Size: Normal)   Pulse (!) 120   Temp 98.4 F (36.9 C) (Oral)   Resp 16   Wt 192 lb 6.4 oz (87.3 kg)   BMI 31.05 kg/m  Vitals:   03/20/18 0704  BP: (!) 158/82  Pulse: (!) 120  Resp: 16  Temp: 98.4 F (36.9 C)  TempSrc: Oral  Weight: 192 lb 6.4 oz (87.3 kg)     Physical Exam  Constitutional: She appears well-developed and well-nourished. No distress.  Neck: Normal range of motion. Neck supple. Muscular tenderness (tender and spasms noted through upper trapezius bilaterally) present. No spinous process tenderness present.  Cardiovascular: Normal rate, regular rhythm and normal heart sounds. Exam reveals no gallop and no friction rub.  No murmur heard. Pulmonary/Chest: Effort normal and breath sounds normal. No respiratory distress. She has no wheezes. She has no rales.  Musculoskeletal:       Lumbar back: She exhibits decreased range of motion, tenderness and spasm. She exhibits no bony tenderness.  Skin: She is not diaphoretic.  Vitals reviewed.      Assessment & Plan:     1. Cervical radiculopathy Will get imaging as below since last imaging was done in 2006. Will treat with medrol dose pak and baclofen as below. Continue conservative treatment with heat and ice, stretches.  I will f/u pending xray results and we will most likely refer to a spine specialist for further evaluation.  - DG Cervical Spine Complete; Future - methylPREDNISolone (MEDROL) 4 MG TBPK tablet; 6 day taper; take as directed on package instructions  Dispense: 21 tablet; Refill: 0 - baclofen (LIORESAL) 20 MG tablet; Take 1 tablet (20 mg total) by mouth 3 (three) times daily.  Dispense: 90 each; Refill: 1  2. Chronic bilateral low back pain with bilateral sciatica Chronic issue with acute flare. Treatment will follow the same as noted above.  - DG Lumbar Spine Complete;  Future - methylPREDNISolone (MEDROL) 4 MG TBPK tablet; 6 day taper; take as directed on package instructions  Dispense: 21 tablet; Refill: 0 - baclofen (LIORESAL) 20 MG tablet; Take 1 tablet (20 mg total) by mouth 3 (three) times daily.  Dispense: 90 each; Refill: Gordon, PA-C  [facility]

## 2018-03-21 ENCOUNTER — Telehealth: Payer: Self-pay | Admitting: Physician Assistant

## 2018-03-21 ENCOUNTER — Telehealth: Payer: Self-pay

## 2018-03-21 DIAGNOSIS — M5136 Other intervertebral disc degeneration, lumbar region: Secondary | ICD-10-CM

## 2018-03-21 DIAGNOSIS — M5412 Radiculopathy, cervical region: Secondary | ICD-10-CM

## 2018-03-21 NOTE — Telephone Encounter (Signed)
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 Worse at the bottom of the lumbar spine. Could try PT if you want conservative measures to see if it will help your neck and low back pain. If not would recommend referral to ortho spine specialist.

## 2018-03-21 NOTE — Telephone Encounter (Signed)
Patient states the only way she can do PT is with she could get scheduled with the outpatient physical therapy center on  [address].

## 2018-03-21 NOTE — Telephone Encounter (Signed)
pt called wanting to know if there is a copy of her FMLA papers ready for her to pick up.    Pt's CB#  [phone]  Leave detailed message  Thanks  Con Memos

## 2018-03-22 ENCOUNTER — Telehealth: Payer: Self-pay | Admitting: Obstetrics and Gynecology

## 2018-03-22 ENCOUNTER — Encounter: Payer: Self-pay | Admitting: Physician Assistant

## 2018-03-22 DIAGNOSIS — G8929 Other chronic pain: Secondary | ICD-10-CM

## 2018-03-22 DIAGNOSIS — M5442 Lumbago with sciatica, left side: Secondary | ICD-10-CM

## 2018-03-22 DIAGNOSIS — M5441 Lumbago with sciatica, right side: Secondary | ICD-10-CM

## 2018-03-22 DIAGNOSIS — M5136 Other intervertebral disc degeneration, lumbar region: Secondary | ICD-10-CM

## 2018-03-22 DIAGNOSIS — M5412 Radiculopathy, cervical region: Secondary | ICD-10-CM

## 2018-03-22 NOTE — Telephone Encounter (Signed)
Is she talking about the old ones?   They are under media and can be printed for her to pick up.   I have not received any new forms

## 2018-03-22 NOTE — Telephone Encounter (Signed)
Referral placed.

## 2018-03-22 NOTE — Telephone Encounter (Signed)
Patient came to pick up a copy

## 2018-03-22 NOTE — Telephone Encounter (Signed)
Patient scheduled 12/30 for Mirena replacement.

## 2018-03-24 NOTE — Telephone Encounter (Signed)
Noted. Will order to arrive by apt date/time. 

## 2018-04-04 DIAGNOSIS — R030 Elevated blood-pressure reading, without diagnosis of hypertension: Secondary | ICD-10-CM | POA: Diagnosis not present

## 2018-04-04 DIAGNOSIS — Z6829 Body mass index (BMI) 29.0-29.9, adult: Secondary | ICD-10-CM | POA: Diagnosis not present

## 2018-04-04 DIAGNOSIS — M545 Low back pain: Secondary | ICD-10-CM | POA: Diagnosis not present

## 2018-04-05 ENCOUNTER — Other Ambulatory Visit: Payer: Self-pay | Admitting: Neurosurgery

## 2018-04-05 DIAGNOSIS — G8929 Other chronic pain: Secondary | ICD-10-CM

## 2018-04-05 DIAGNOSIS — M545 Low back pain, unspecified: Secondary | ICD-10-CM

## 2018-04-27 ENCOUNTER — Encounter: Payer: Self-pay | Admitting: Family Medicine

## 2018-04-27 ENCOUNTER — Other Ambulatory Visit: Payer: Self-pay | Admitting: Obstetrics and Gynecology

## 2018-04-27 ENCOUNTER — Other Ambulatory Visit: Payer: Self-pay | Admitting: Family Medicine

## 2018-04-27 ENCOUNTER — Encounter: Payer: Self-pay | Admitting: Physician Assistant

## 2018-04-27 ENCOUNTER — Ambulatory Visit: Payer: 59 | Admitting: Family Medicine

## 2018-04-27 VITALS — BP 156/115 | HR 83 | Temp 97.4°F | Wt 192.4 lb

## 2018-04-27 DIAGNOSIS — K859 Acute pancreatitis without necrosis or infection, unspecified: Secondary | ICD-10-CM

## 2018-04-27 DIAGNOSIS — Z1321 Encounter for screening for nutritional disorder: Secondary | ICD-10-CM | POA: Diagnosis not present

## 2018-04-27 MED ORDER — ONDANSETRON HCL 4 MG PO TABS: 4.0000 mg | ORAL_TABLET | Freq: Three times a day (TID) | ORAL | 0 refills | Status: DC | PRN

## 2018-04-27 MED ORDER — HYDROCODONE-ACETAMINOPHEN 10-325 MG PO TABS: 1.0000 | ORAL_TABLET | Freq: Four times a day (QID) | ORAL | 0 refills | Status: AC | PRN

## 2018-04-27 NOTE — Patient Instructions (Signed)
Acute Pancreatitis  Acute pancreatitis is a condition in which the pancreas suddenly becomes irritated and swollen (has inflammation). The pancreas is a gland that is located behind the stomach. It produces enzymes that help to digest food. The pancreas also releases the hormones glucagon and insulin, which help to regulate blood sugar. Damage to the pancreas occurs when the digestive enzymes from the pancreas are activated before they are released into the intestine.  Most acute attacks last a couple of days and can cause serious problems. Some people become dehydrated and develop low blood pressure. In severe cases, bleeding into the pancreas can lead to shock and can be life-threatening. The lungs, heart, and kidneys may fail.  What are the causes?  The most common causes of this condition are:  · Alcohol abuse.  · Gallstones.    Other causes include:  · Certain medicines.  · Exposure to certain chemicals.  · Infection.  · Damage caused by an accident (trauma).  · Abdominal surgery.    In some cases, the cause may not be known.  What are the signs or symptoms?  Symptoms of this condition include:  · Pain in the upper abdomen that may radiate to the back.  · Tenderness and swelling of the abdomen.  · Nausea and vomiting.    How is this diagnosed?  This condition may be diagnosed based on:  · A physical exam.  · Blood tests.  · Imaging tests, such as X-rays, CT scans, or an ultrasound of the abdomen.    How is this treated?  Treatment for this condition usually requires a stay in the hospital. Treatment may include:  · Pain medicine.  · Fluid replacement through an IV tube.  · Placing a tube in the stomach to remove stomach contents and to control vomiting (NG tube, or nasogastric tube).  · Not eating for 3-4 days. This gives the pancreas a rest, because enzymes are not being produced that can cause further damage.  · Antibiotic medicines, if your condition is caused by an infection.  · Surgery on the pancreas or  gallbladder.    Follow these instructions at home:  Eating and drinking  · Follow instructions from your health care provider about diet. This may involve avoiding alcohol and decreasing the amount of fat in your diet.  · Eat smaller, more frequent meals. This reduces the amount of digestive fluids that the pancreas produces.  · Drink enough fluid to keep your urine clear or pale yellow.  · Do not drink alcohol if it caused your condition.  General instructions  · Take over-the-counter and prescription medicines only as told by your health care provider.  · Do not use any tobacco products, such as cigarettes, chewing tobacco, and e-cigarettes. If you need help quitting, ask your health care provider.  · Get plenty of rest.  · If directed, check your blood sugar at home as told by your health care provider.  · Keep all follow-up visits as told by your health care provider. This is important.  Contact a health care provider if:  · You do not recover as quickly as expected.  · You develop new or worsening symptoms.  · You have persistent pain, weakness, or nausea.  · You recover and then have another episode of pain.  · You have a fever.  Get help right away if:  · You cannot eat or keep fluids down.  · Your pain becomes severe.  · Your skin or the   white part of your eyes turns yellow (jaundice).  · You vomit.  · You feel dizzy or you faint.  · Your blood sugar is high (over 300 mg/dL).  This information is not intended to replace advice given to you by your health care provider. Make sure you discuss any questions you have with your health care provider.  Document Released: 05/03/2005 Document Revised: 09/10/2015 Document Reviewed: 02/04/2015  Elsevier Interactive Patient Education © 2018 [employer] Inc[employer]

## 2018-04-27 NOTE — Progress Notes (Signed)
Patient: Ashley Suarez Female    DOB: [birth year]   32 y.o.   MRN: [id number] Visit Date: 04/27/2018  Today's Provider: Lavon Paganini, MD   Chief Complaint  Patient presents with  . Abdominal Pain   Subjective:    I, Tiburcio Pea, CMA, am acting as a scribe for Lavon Paganini, MD.    Abdominal Pain  This is a recurrent problem. The current episode started yesterday. The onset quality is sudden. The problem occurs intermittently. The pain is located in the epigastric region. The quality of the pain is cramping. The abdominal pain radiates to the back. Associated symptoms include diarrhea. The pain is relieved by standing. She has tried nothing for the symptoms. Her past medical history is significant for pancreatitis.   She does drink alcohol nearly daily.  She believes that eating a lot of stew brought this on.     Allergies  Allergen Reactions  . Hydrocodone-Acetaminophen Nausea And Vomiting  . Tramadol Nausea And Vomiting    Also claims TREMORS     Current Outpatient Medications:  .  baclofen (LIORESAL) 20 MG tablet, Take 1 tablet (20 mg total) by mouth 3 (three) times daily., Disp: 90 each, Rfl: 1 .  fluticasone (FLONASE) 50 MCG/ACT nasal spray, Place 2 sprays into both nostrils daily., Disp: 48 g, Rfl: 1 .  Multiple Vitamin (MULTI-VITAMINS) TABS, Take by mouth., Disp: , Rfl:   Review of Systems  Constitutional: Negative.   Respiratory: Negative.   Cardiovascular: Negative.   Gastrointestinal: Positive for abdominal pain and diarrhea.  Musculoskeletal: Negative.     Social History   Tobacco Use  . Smoking status: Current Some Day Smoker    Packs/day: 0.50    Types: Cigarettes  . Smokeless tobacco: Never Used  Substance Use Topics  . Alcohol use: Yes    Alcohol/week: 0.0 standard drinks   Objective:   BP (!) 156/115 (BP Location: Right Arm, Patient Position: Sitting, Cuff Size: Normal)   Pulse 83   Temp (!) 97.4 F (36.3 C) (Oral)   Wt 192  lb 6.4 oz (87.3 kg)   SpO2 99%   BMI 31.05 kg/m  Vitals:   04/27/18 0939 04/27/18 0942  BP: (!) 175/138 (!) 156/115  Pulse: 83   Temp: (!) 97.4 F (36.3 C)   TempSrc: Oral   SpO2: 99%   Weight: 192 lb 6.4 oz (87.3 kg)      Physical Exam Vitals signs and nursing note reviewed.  Constitutional:      Appearance: She is obese. She is not toxic-appearing (non-toxic, but appears uncmfortable, standing throughout visit).  HENT:     Head: Normocephalic and atraumatic.     Mouth/Throat:     Mouth: Mucous membranes are moist.  Eyes:     General: No scleral icterus. Cardiovascular:     Rate and Rhythm: Normal rate and regular rhythm.     Heart sounds: Normal heart sounds. No murmur.  Pulmonary:     Effort: Pulmonary effort is normal. No respiratory distress.     Breath sounds: Normal breath sounds. No wheezing or rhonchi.  Abdominal:     General: Bowel sounds are normal. There is no distension.     Palpations: Abdomen is soft.     Tenderness: There is abdominal tenderness in the epigastric area. There is guarding. There is no right CVA tenderness, left CVA tenderness or rebound. Negative signs include Murphy's sign.  Skin:    General: Skin is warm and  dry.     Capillary Refill: Capillary refill takes less than 2 seconds.     Coloration: Skin is not jaundiced.     Findings: No rash.  Neurological:     General: No focal deficit present.     Mental Status: She is alert and oriented to person, place, and time.  Psychiatric:        Mood and Affect: Mood normal.        Behavior: Behavior normal.        Assessment & Plan:   Problem List Items Addressed This Visit      Digestive   Acute pancreatitis - Primary    Recurrent issue Symptoms and exam c/w possible pancreatitis Suspect this is related to alcohol intake Not on any offending agents Check labs No need for imaging at this time Could consider RUQ [place] to assess for gallstones in the future Discussed strict return  precautions Bowel rest with bland foods Pain meds prn      Relevant Medications   HYDROcodone-acetaminophen (NORCO) 10-325 MG tablet   Other Relevant Orders   CBC w/Diff/Platelet   Comprehensive metabolic panel   Lipase       Return if symptoms worsen or fail to improve.   The entirety of the information documented in the History of Present Illness, Review of Systems and Physical Exam were personally obtained by me. Portions of this information were initially documented by Tiburcio Pea, CMA and reviewed by me for thoroughness and accuracy.    Virginia Crews, MD, MPH [facility] 04/28/2018 1:20 PM

## 2018-04-28 LAB — COMPREHENSIVE METABOLIC PANEL
ALK PHOS: 70 IU/L (ref 39–117)
ALT: 31 IU/L (ref 0–32)
AST: 28 IU/L (ref 0–40)
Albumin/Globulin Ratio: 1.9 (ref 1.2–2.2)
Albumin: 4.5 g/dL (ref 3.5–5.5)
BILIRUBIN TOTAL: 0.3 mg/dL (ref 0.0–1.2)
BUN / CREAT RATIO: 10 (ref 9–23)
BUN: 9 mg/dL (ref 6–20)
CHLORIDE: 101 mmol/L (ref 96–106)
CO2: 18 mmol/L — AB (ref 20–29)
CREATININE: 0.87 mg/dL (ref 0.57–1.00)
Calcium: 9.8 mg/dL (ref 8.7–10.2)
GFR calc Af Amer: 101 mL/min/{1.73_m2} (ref >59)
GFR calc non Af Amer: 87 mL/min/{1.73_m2} (ref >59)
GLOBULIN, TOTAL: 2.4 g/dL (ref 1.5–4.5)
Glucose: 83 mg/dL (ref 65–99)
POTASSIUM: 4.4 mmol/L (ref 3.5–5.2)
SODIUM: 137 mmol/L (ref 134–144)
Total Protein: 6.9 g/dL (ref 6.0–8.5)

## 2018-04-28 LAB — CBC WITH DIFFERENTIAL/PLATELET
BASOS ABS: 0 10*3/uL (ref 0.0–0.2)
Basos: 0 %
EOS (ABSOLUTE): 0.1 10*3/uL (ref 0.0–0.4)
EOS: 1 %
HEMATOCRIT: 43.3 % (ref 34.0–46.6)
Hemoglobin: 14.9 g/dL (ref 11.1–15.9)
Immature Grans (Abs): 0 10*3/uL (ref 0.0–0.1)
Immature Granulocytes: 0 %
LYMPHS ABS: 2.6 10*3/uL (ref 0.7–3.1)
Lymphs: 28 %
MCH: 34.7 pg — AB (ref 26.6–33.0)
MCHC: 34.4 g/dL (ref 31.5–35.7)
MCV: 101 fL — ABNORMAL HIGH (ref 79–97)
MONOS ABS: 0.5 10*3/uL (ref 0.1–0.9)
Monocytes: 5 %
NEUTROS ABS: 6.2 10*3/uL (ref 1.4–7.0)
Neutrophils: 66 %
Platelets: 285 10*3/uL (ref 150–450)
RBC: 4.29 x10E6/uL (ref 3.77–5.28)
RDW: 12.8 % (ref 12.3–15.4)
WBC: 9.4 10*3/uL (ref 3.4–10.8)

## 2018-04-28 LAB — LIPASE: Lipase: 31 U/L (ref 14–72)

## 2018-04-28 NOTE — Assessment & Plan Note (Signed)
Recurrent issue Symptoms and exam c/w possible pancreatitis Suspect this is related to alcohol intake Not on any offending agents Check labs No need for imaging at this time Could consider RUQ [place] to assess for gallstones in the future Discussed strict return precautions Bowel rest with bland foods Pain meds prn

## 2018-05-02 ENCOUNTER — Ambulatory Visit
Admission: RE | Admit: 2018-05-02 | Discharge: 2018-05-02 | Disposition: A | Discharge status: Home / Self Care | Payer: 59 | Source: Ambulatory Visit | Attending: Neurosurgery | Admitting: Neurosurgery

## 2018-05-02 DIAGNOSIS — M545 Low back pain, unspecified: Secondary | ICD-10-CM

## 2018-05-02 DIAGNOSIS — M5136 Other intervertebral disc degeneration, lumbar region: Secondary | ICD-10-CM | POA: Diagnosis not present

## 2018-05-02 DIAGNOSIS — G8929 Other chronic pain: Secondary | ICD-10-CM

## 2018-05-02 LAB — SPECIMEN STATUS REPORT

## 2018-05-02 LAB — VITAMIN D 25 HYDROXY (VIT D DEFICIENCY, FRACTURES): Vit D, 25-Hydroxy: 17.2 ng/mL — ABNORMAL LOW (ref 30.0–100.0)

## 2018-05-04 ENCOUNTER — Other Ambulatory Visit: Payer: Self-pay | Admitting: Obstetrics and Gynecology

## 2018-05-15 ENCOUNTER — Other Ambulatory Visit (INDEPENDENT_AMBULATORY_CARE_PROVIDER_SITE_OTHER): Payer: 59

## 2018-05-15 ENCOUNTER — Ambulatory Visit (INDEPENDENT_AMBULATORY_CARE_PROVIDER_SITE_OTHER): Payer: 59 | Admitting: Obstetrics and Gynecology

## 2018-05-15 ENCOUNTER — Other Ambulatory Visit (HOSPITAL_COMMUNITY)
Admission: RE | Admit: 2018-05-15 | Discharge: 2018-05-15 | Disposition: A | Discharge status: Home / Self Care | Payer: 59 | Source: Ambulatory Visit | Attending: Obstetrics and Gynecology | Admitting: Obstetrics and Gynecology

## 2018-05-15 ENCOUNTER — Encounter: Payer: Self-pay | Admitting: Obstetrics and Gynecology

## 2018-05-15 VITALS — BP 130/90 | HR 97 | Ht 66.0 in | Wt 187.0 lb

## 2018-05-15 DIAGNOSIS — Z1339 Encounter for screening examination for other mental health and behavioral disorders: Secondary | ICD-10-CM

## 2018-05-15 DIAGNOSIS — Z124 Encounter for screening for malignant neoplasm of cervix: Secondary | ICD-10-CM | POA: Diagnosis not present

## 2018-05-15 DIAGNOSIS — Z01419 Encounter for gynecological examination (general) (routine) without abnormal findings: Secondary | ICD-10-CM

## 2018-05-15 DIAGNOSIS — Z30432 Encounter for removal of intrauterine contraceptive device: Secondary | ICD-10-CM

## 2018-05-15 DIAGNOSIS — T8332XA Displacement of intrauterine contraceptive device, initial encounter: Secondary | ICD-10-CM | POA: Diagnosis not present

## 2018-05-15 DIAGNOSIS — Z1331 Encounter for screening for depression: Secondary | ICD-10-CM
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
## 2018-05-16 LAB — CYTOLOGY - PAP
Diagnosis: NEGATIVE
HPV (WINDOPATH): NOT DETECTED

## 2018-05-18 ENCOUNTER — Encounter: Payer: Self-pay | Admitting: Physician Assistant

## 2018-05-18 DIAGNOSIS — Z124 Encounter for screening for malignant neoplasm of cervix: Secondary | ICD-10-CM

## 2018-05-22 ENCOUNTER — Ambulatory Visit: Payer: 59 | Admitting: Obstetrics and Gynecology

## 2018-05-22 ENCOUNTER — Telehealth: Payer: Self-pay | Admitting: Obstetrics and Gynecology

## 2018-05-22 ENCOUNTER — Other Ambulatory Visit: Payer: 59

## 2018-05-22 NOTE — Telephone Encounter (Signed)
Mirena reserved for this patient.

## 2018-05-22 NOTE — Telephone Encounter (Signed)
Pt coming in on 06/05/18 at 4:10 for iud removal and reinsertion

## 2018-06-02 ENCOUNTER — Ambulatory Visit: Payer: 59 | Admitting: Obstetrics and Gynecology

## 2018-06-05 ENCOUNTER — Ambulatory Visit (INDEPENDENT_AMBULATORY_CARE_PROVIDER_SITE_OTHER): Payer: No Typology Code available for payment source | Admitting: Physician Assistant

## 2018-06-05 ENCOUNTER — Encounter: Payer: Self-pay | Admitting: Physician Assistant

## 2018-06-05 ENCOUNTER — Ambulatory Visit: Payer: 59 | Admitting: Obstetrics and Gynecology

## 2018-06-05 VITALS — BP 129/97 | HR 93 | Temp 97.9°F | Resp 16

## 2018-06-05 DIAGNOSIS — J029 Acute pharyngitis, unspecified: Secondary | ICD-10-CM

## 2018-06-05 DIAGNOSIS — J069 Acute upper respiratory infection, unspecified: Secondary | ICD-10-CM | POA: Diagnosis not present

## 2018-06-05 LAB — POCT RAPID STREP A (OFFICE): Rapid Strep A Screen: NEGATIVE

## 2018-06-05 NOTE — Patient Instructions (Signed)

## 2018-06-05 NOTE — Progress Notes (Signed)
Patient: Ashley Suarez Female    DOB: 07/28/1985   33 y.o.   MRN: [id number] Visit Date: 06/05/2018  Today's Provider: Mar Daring, PA-C   Chief Complaint  Patient presents with  . Sore Throat   Subjective:     Sore Throat   This is a new problem. The current episode started in the past 7 days (2 days). There has been no fever. Associated symptoms include congestion, coughing and neck pain. Pertinent negatives include no abdominal pain, diarrhea, drooling, ear discharge, ear pain, headaches, hoarse voice, plugged ear sensation, shortness of breath, stridor, swollen glands or vomiting. She has had no exposure to strep or mono. Treatments tried: elderberry gummies.   Patient has had a sore throat for 2 days. Patient states her throat is slowly worsening. Patient also has nasal congestion, slight cough, and neck pain. Patient has been taking elderberry gummies with no relief. Son has had flu-like symptoms since Friday.   Allergies  Allergen Reactions  . Hydrocodone-Acetaminophen Nausea And Vomiting  . Tramadol Nausea And Vomiting    Also claims TREMORS     Current Outpatient Medications:  .  fluticasone (FLONASE) 50 MCG/ACT nasal spray, Place 2 sprays into both nostrils daily., Disp: 48 g, Rfl: 1 .  Multiple Vitamin (MULTI-VITAMINS) TABS, Take by mouth., Disp: , Rfl:  .  baclofen (LIORESAL) 20 MG tablet, Take 1 tablet (20 mg total) by mouth 3 (three) times daily. (Patient not taking: Reported on 06/05/2018), Disp: 90 each, Rfl: 1 .  ondansetron (ZOFRAN) 4 MG tablet, Take 1 tablet (4 mg total) by mouth every 8 (eight) hours as needed for nausea or vomiting. (Patient not taking: Reported on 06/05/2018), Disp: 20 tablet, Rfl: 0  Review of Systems  Constitutional: Negative for appetite change, chills, fatigue and fever.  HENT: Positive for congestion, postnasal drip and sore throat. Negative for drooling, ear discharge, ear pain, hoarse voice, rhinorrhea, sinus pressure and  sinus pain.   Respiratory: Positive for cough. Negative for chest tightness, shortness of breath and stridor.   Cardiovascular: Negative for chest pain and palpitations.  Gastrointestinal: Negative for abdominal pain, diarrhea, nausea and vomiting.  Musculoskeletal: Positive for neck pain.  Neurological: Negative for dizziness, weakness and headaches.    Social History   Tobacco Use  . Smoking status: Current Some Day Smoker    Packs/day: 0.50    Types: Cigarettes  . Smokeless tobacco: Never Used  Substance Use Topics  . Alcohol use: Yes    Alcohol/week: 0.0 standard drinks      Objective:   BP (!) 129/97 (BP Location: Left Arm, Patient Position: Sitting, Cuff Size: Large)   Pulse 93   Temp 97.9 F (36.6 C) (Oral)   Resp 16   SpO2 98%  Vitals:   06/05/18 1648  BP: (!) 129/97  Pulse: 93  Resp: 16  Temp: 97.9 F (36.6 C)  TempSrc: Oral  SpO2: 98%     Physical Exam Vitals signs reviewed.  Constitutional:      General: She is not in acute distress.    Appearance: She is well-developed. She is not ill-appearing or diaphoretic.  HENT:     Head: Normocephalic and atraumatic.     Right Ear: Hearing, tympanic membrane, ear canal and external ear normal. No middle ear effusion. Tympanic membrane is not erythematous.     Left Ear: Hearing, tympanic membrane, ear canal and external ear normal.  No middle ear effusion. Tympanic membrane is not erythematous.  Nose: Rhinorrhea present.     Mouth/Throat:     Mouth: Mucous membranes are moist.     Pharynx: Uvula midline. Posterior oropharyngeal erythema present. No oropharyngeal exudate.  Eyes:     General: No scleral icterus.       Right eye: No discharge.        Left eye: No discharge.     Conjunctiva/sclera: Conjunctivae normal.     Pupils: Pupils are equal, round, and reactive to light.  Neck:     Musculoskeletal: Normal range of motion and neck supple.     Thyroid: No thyromegaly.     Trachea: No tracheal  deviation.  Cardiovascular:     Rate and Rhythm: Normal rate and regular rhythm.     Heart sounds: Normal heart sounds. No murmur. No friction rub. No gallop.   Pulmonary:     Effort: Pulmonary effort is normal. No respiratory distress.     Breath sounds: Normal breath sounds. No stridor. No wheezing or rales.  Lymphadenopathy:     Cervical: No cervical adenopathy.  Skin:    General: Skin is warm and dry.  Neurological:     Mental Status: She is alert.        Assessment & Plan    1. Viral upper respiratory illness Continue conservative management with OTC medications of choice (Mucinex, Dayquil, Tylenol cold, etc). Work note provided. Call if symptoms worsen or fail to improve over the next 7-10 days.   2. Sore throat Rapid strep negative.  - POCT rapid strep A     Mar Daring, PA-C  [facility]

## 2018-06-21 ENCOUNTER — Encounter: Payer: Self-pay | Admitting: Obstetrics and Gynecology

## 2018-06-21 ENCOUNTER — Ambulatory Visit (INDEPENDENT_AMBULATORY_CARE_PROVIDER_SITE_OTHER): Payer: No Typology Code available for payment source | Admitting: Obstetrics and Gynecology

## 2018-06-21 VITALS — BP 126/84 | Ht 66.0 in | Wt 180.0 lb

## 2018-06-21 DIAGNOSIS — Z30433 Encounter for removal and reinsertion of intrauterine contraceptive device: Secondary | ICD-10-CM

## 2018-06-21 MED ORDER — LEVONORGESTREL 20 MCG/24HR IU IUD: 1.0000 | INTRAUTERINE_SYSTEM | Freq: Once | INTRAUTERINE | 0 refills | Status: AC

## 2018-06-21 NOTE — Patient Instructions (Signed)
Intrauterine Device Insertion, Care After    This sheet gives you information about how to care for yourself after your procedure. Your health care provider may also give you more specific instructions. If you have problems or questions, contact your health care provider.  What can I expect after the procedure?  After the procedure, it is common to have:  · Cramps and pain in the abdomen.  · Light bleeding (spotting) or heavier bleeding that is like your menstrual period. This may last for up to a few days.  · Lower back pain.  · Dizziness.  · Headaches.  · Nausea.  Follow these instructions at home:  · Before resuming sexual activity, check to make sure that you can feel the IUD string(s). You should be able to feel the end of the string(s) below the opening of your cervix. If your IUD string is in place, you may resume sexual activity.  ? If you had a hormonal IUD inserted more than 7 days after your most recent period started, you will need to use a backup method of birth control for 7 days after IUD insertion. Ask your health care provider whether this applies to you.  · Continue to check that the IUD is still in place by feeling for the string(s) after every menstrual period, or once a month.  · Take over-the-counter and prescription medicines only as told by your health care provider.  · Do not drive or use heavy machinery while taking prescription pain medicine.  · Keep all follow-up visits as told by your health care provider. This is important.  Contact a health care provider if:  · You have bleeding that is heavier or lasts longer than a normal menstrual cycle.  · You have a fever.  · You have cramps or abdominal pain that get worse or do not get better with medicine.  · You develop abdominal pain that is new or is not in the same area of earlier cramping and pain.  · You feel lightheaded or weak.  · You have abnormal or bad-smelling discharge from your vagina.  · You have pain during sexual  activity.  · You have any of the following problems with your IUD string(s):  ? The string bothers or hurts you or your sexual partner.  ? You cannot feel the string.  ? The string has gotten longer.  · You can feel the IUD in your vagina.  · You think you may be pregnant, or you miss your menstrual period.  · You think you may have an STI (sexually transmitted infection).  Get help right away if:  · You have flu-like symptoms.  · You have a fever and chills.  · You can feel that your IUD has slipped out of place.  Summary  · After the procedure, it is common to have cramps and pain in the abdomen. It is also common to have light bleeding (spotting) or heavier bleeding that is like your menstrual period.  · Continue to check that the IUD is still in place by feeling for the string(s) after every menstrual period, or once a month.  · Keep all follow-up visits as told by your health care provider. This is important.  · Contact your health care provider if you have problems with your IUD string(s), such as the string getting longer or bothering you or your sexual partner.  This information is not intended to replace advice given to you by your health care provider. Make   sure you discuss any questions you have with your health care provider.  Document Released: 12/30/2010 Document Revised: 03/24/2016 Document Reviewed: 03/24/2016  Elsevier Interactive Patient Education © 2019 [employer]

## 2018-06-21 NOTE — Progress Notes (Signed)
   IUD Removal  Patient identified, informed consent performed, consent signed.  Patient was in the dorsal lithotomy position, normal external genitalia was noted.  A speculum was placed in the patient's vagina, normal discharge was noted, no lesions. The cervix was visualized, no lesions, no abnormal discharge.  The strings of the IUD were not visualized.  So, the attempts were made to find the strings in the cervix, which were unsuccessful.  Based on her last attmpted removal procedure, I knew the IUD was in her uterus and her strings were also likely to be in her uterus.  The cervix was gently dilated (after prepping with Betadine x 2) with Kennon Rounds dilators.  The IUD "hook" was introduced and on the third pass the IUD was able to be retrieved. Patient tolerated the procedure well.    IUD Insertion Procedure Note Patient identified, informed consent performed, consent signed.   Discussed risks of irregular bleeding, cramping, infection, malpositioning, expulsion or uterine perforation of the IUD (1:1000 placements)  which may require further procedure such as laparoscopy.  IUD while effective at preventing pregnancy do not prevent transmission of sexually transmitted diseases and use of barrier methods for this purpose was discussed. Time out was performed.  Urine pregnancy test negative.  Speculum already in the vagina.  Cervix visualized.  Cleaned with Betadine x 2.  Grasped anteriorly with a single tooth tenaculum.  Uterus sounded to 8 cm. IUD placed per manufacturer's recommendations.  Strings trimmed to 3 cm. Tenaculum was removed, good hemostasis noted.  Patient tolerated procedure well.   Patient was given post-procedure instructions.  She was advised to have backup contraception for one week.  Patient was also asked to check IUD strings periodically and follow up in 4 weeks for IUD check.  Prentice Docker, MD, [facility], [facility] 06/21/2018 12:21 PM

## 2018-07-21 ENCOUNTER — Encounter: Payer: Self-pay | Admitting: Obstetrics and Gynecology

## 2018-07-21 ENCOUNTER — Ambulatory Visit (INDEPENDENT_AMBULATORY_CARE_PROVIDER_SITE_OTHER): Payer: No Typology Code available for payment source | Admitting: Obstetrics and Gynecology

## 2018-07-21 VITALS — BP 120/78 | HR 66 | Ht 66.0 in | Wt 178.0 lb

## 2018-07-21 DIAGNOSIS — Z30431 Encounter for routine checking of intrauterine contraceptive device: Secondary | ICD-10-CM

## 2018-07-21 NOTE — Progress Notes (Signed)
IUD String Check  Subjctive: [name] presents for IUD string check.  She had a Mirena placed 4 weeks ago.  Since placement of her IUD she had minimal vaginal bleeding.  She denies cramping or discomfort.  She has had intercourse since placement.  She has not checked the strings.  She denies any fever, chills, nausea, vomiting, or other complaints.    Objective: There were no vitals taken for this visit. Physical Exam Exam conducted with a chaperone present.  Constitutional:      General: She is not in acute distress.    Appearance: Normal appearance. She is well-developed.  HENT:     Head: Normocephalic and atraumatic.     Nose: Nose normal.  Eyes:     General: No scleral icterus.    Conjunctiva/sclera: Conjunctivae normal.  Neck:     Musculoskeletal: Normal range of motion.  Cardiovascular:     Rate and Rhythm: Normal rate and regular rhythm.     Heart sounds: Normal heart sounds.  Pulmonary:     Effort: Pulmonary effort is normal.     Breath sounds: Normal breath sounds.  Abdominal:     General: There is no distension.     Palpations: Abdomen is soft.     Tenderness: There is no abdominal tenderness. There is no guarding or rebound.  Genitourinary:    General: Normal vulva.     Exam position: Lithotomy position.     Labia:        Right: No lesion.        Left: No lesion.      Vagina: Normal. No vaginal discharge.     Cervix: Normal.     Uterus: Normal. Not tender.      Adnexa: Right adnexa normal and left adnexa normal.       Right: No mass.         Left: No mass.       Comments: IUD Strings visible and correct length. Not trimmed today. Musculoskeletal: Normal range of motion.  Skin:    General: Skin is warm and dry.     Coloration: Skin is not jaundiced.     Findings: No rash.  Neurological:     General: No focal deficit present.     Mental Status: She is alert and oriented to person, place, and time.     Cranial Nerves: No cranial nerve deficit.   Psychiatric:        Mood and Affect: Mood normal.        Behavior: Behavior normal.        Judgment: Judgment normal.     Female chaperone was present for the entirety of the pelvic exam  Assessment: 33 y.o. year old female status post prior Mirena IUD placement 4 weeks ago, doing well.  Plan: 1.  The patient was given instructions to check her IUD strings monthly and call with any problems or concerns.  She should call for fevers, chills, abnormal vaginal discharge, pelvic pain, or other complaints. 2.  She will return for a annual exam in 1 year.  All questions answered.  15 minutes spent in face to face discussion with > 50% spent in counseling, management, and coordination of care for her newly-placed IUD.  Risks and benefits of IUD discussed including the risks of irregular bleeding, cramping, infection, malpositioning, expulsion, which may require further procedures such as laparoscopy.  IUDs while effective at preventing pregnancy do not prevent transmission of sexually transmitted diseases and  use of barrier methods for this purpose was discussed.  Low overall incidence of failure with 99.7% efficacy rate in typical use.    Prentice Docker, MD 07/21/2018 2:43 PM

## 2018-09-19 ENCOUNTER — Encounter: Payer: Self-pay | Admitting: Obstetrics and Gynecology

## 2018-09-19 ENCOUNTER — Ambulatory Visit (INDEPENDENT_AMBULATORY_CARE_PROVIDER_SITE_OTHER): Payer: No Typology Code available for payment source | Admitting: Obstetrics and Gynecology

## 2018-09-19 ENCOUNTER — Other Ambulatory Visit (HOSPITAL_COMMUNITY)
Admission: RE | Admit: 2018-09-19 | Discharge: 2018-09-19 | Disposition: A | Discharge status: Home / Self Care | Payer: No Typology Code available for payment source | Source: Ambulatory Visit | Attending: Obstetrics and Gynecology | Admitting: Obstetrics and Gynecology

## 2018-09-19 ENCOUNTER — Other Ambulatory Visit: Payer: Self-pay

## 2018-09-19 DIAGNOSIS — Z113 Encounter for screening for infections with a predominantly sexual mode of transmission: Secondary | ICD-10-CM | POA: Insufficient documentation
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
## 2018-09-21 LAB — CERVICOVAGINAL ANCILLARY ONLY
Chlamydia: NEGATIVE
Neisseria Gonorrhea: NEGATIVE
Trichomonas: NEGATIVE

## 2018-11-06 ENCOUNTER — Encounter: Payer: Self-pay | Admitting: Physician Assistant

## 2018-11-08 ENCOUNTER — Telehealth: Payer: Self-pay

## 2018-11-08 NOTE — Telephone Encounter (Signed)
Patient is calling to see if Ashley Suarez received FMLA forms from [employer] this week. She states the forms are due soon. She would like a my chart message to advise her if Ashley Suarez received the forms.

## 2018-11-08 NOTE — Telephone Encounter (Signed)
These were completed and faxed yesterday (11/07/18)

## 2018-11-08 NOTE — Telephone Encounter (Signed)
Please advise 

## 2018-11-09 ENCOUNTER — Telehealth: Payer: Self-pay

## 2018-11-09 NOTE — Telephone Encounter (Signed)
faxed

## 2018-11-09 NOTE — Telephone Encounter (Signed)
No answer

## 2018-11-09 NOTE — Telephone Encounter (Signed)
Patient advised. Patient would like a copy faxed to her also. Fax #  [phone] attn: Clarise Cruz

## 2019-04-06 ENCOUNTER — Encounter: Payer: Self-pay | Admitting: Physician Assistant

## 2019-04-06 DIAGNOSIS — L989 Disorder of the skin and subcutaneous tissue, unspecified: Secondary | ICD-10-CM

## 2019-04-06 IMAGING — US US ABDOMEN LIMITED
1 series · 14 of 25 positions shown · non-contrast
Comparison: Abdominal CT scan March 03, 2017 and abdominal
ultrasound August 12, 2008.

CLINICAL DATA: One week of right upper quadrant pain

EXAM:
ULTRASOUND ABDOMEN LIMITED RIGHT UPPER QUADRANT

[Series 1: us abdomen limited · 0.20mm/px · 14 of 74 slices shown]
[im 1/74]
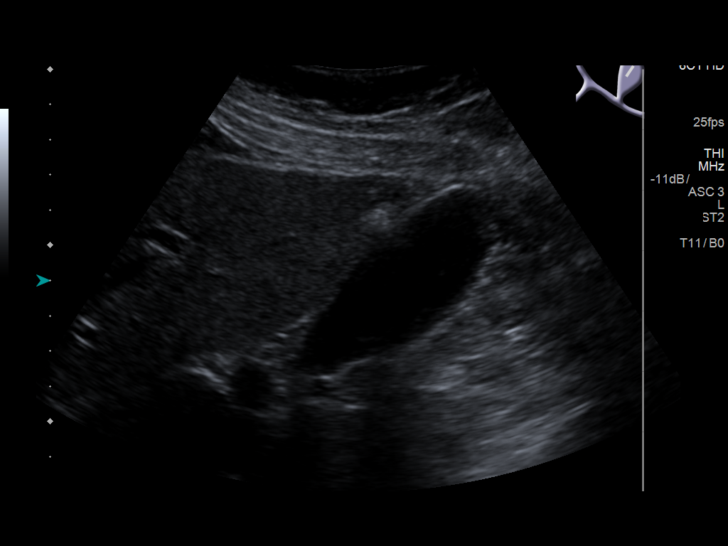
[im 7/74]
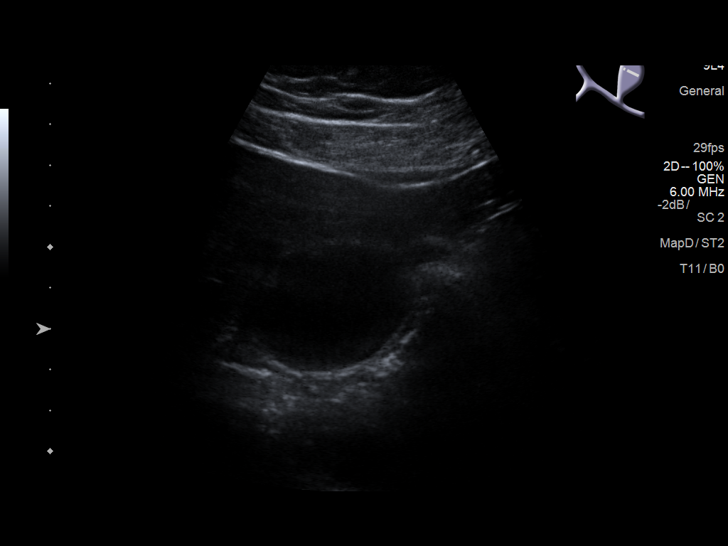
[im 13/74]
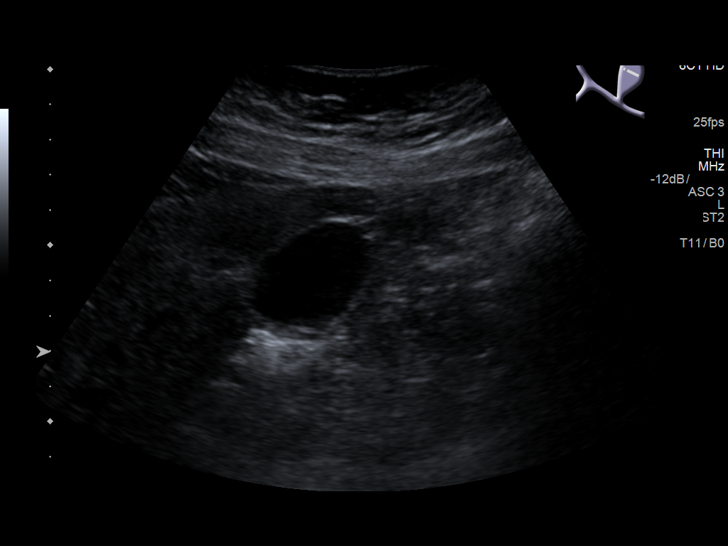
[im 19/74]
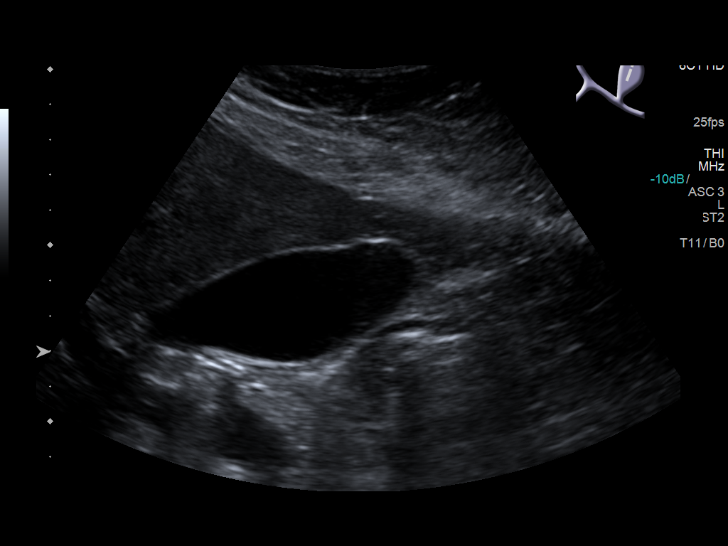
[im 25/74]
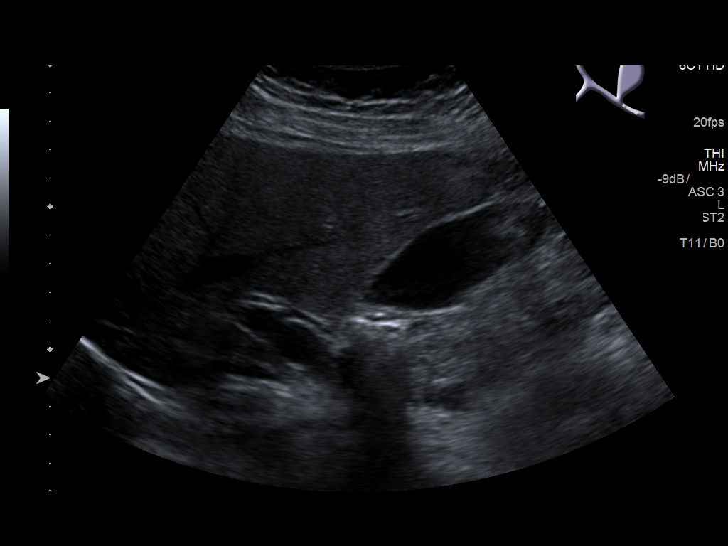
[im 28/74]
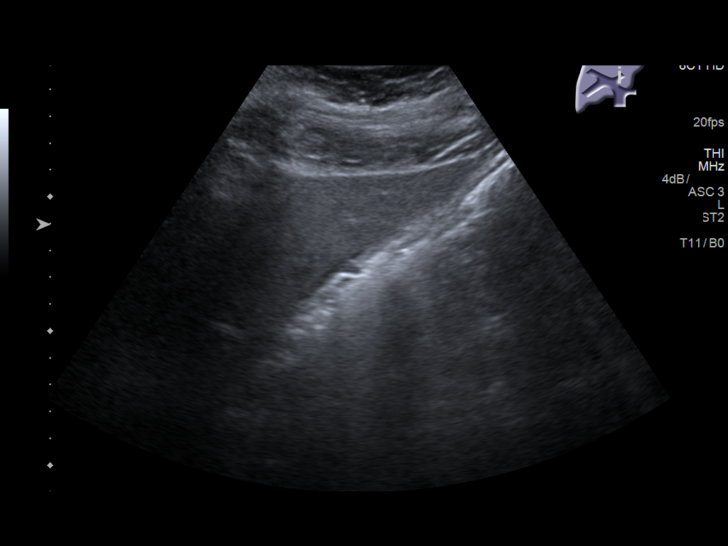
[im 34/74]
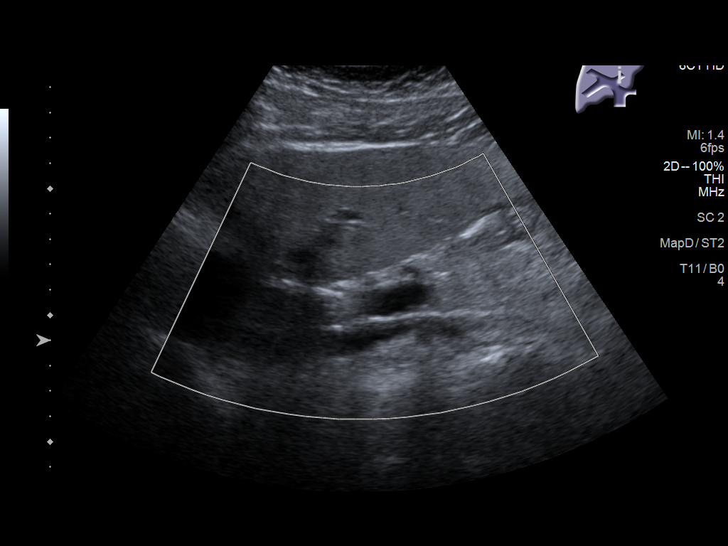
[im 40/74]
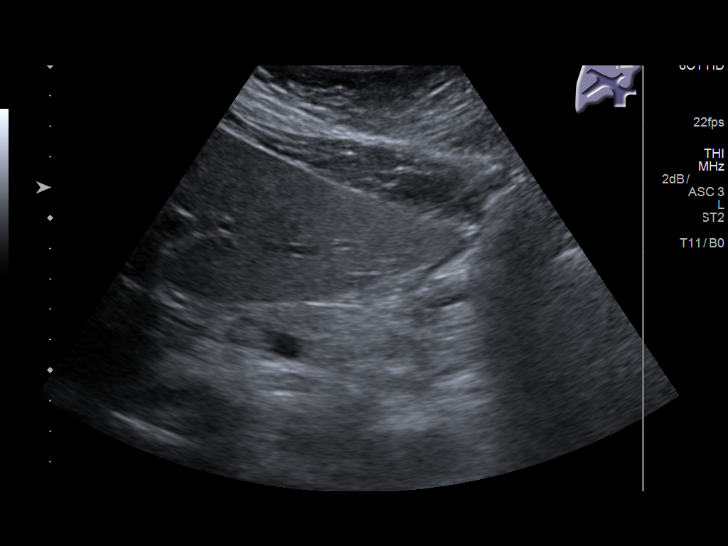
[im 46/74]
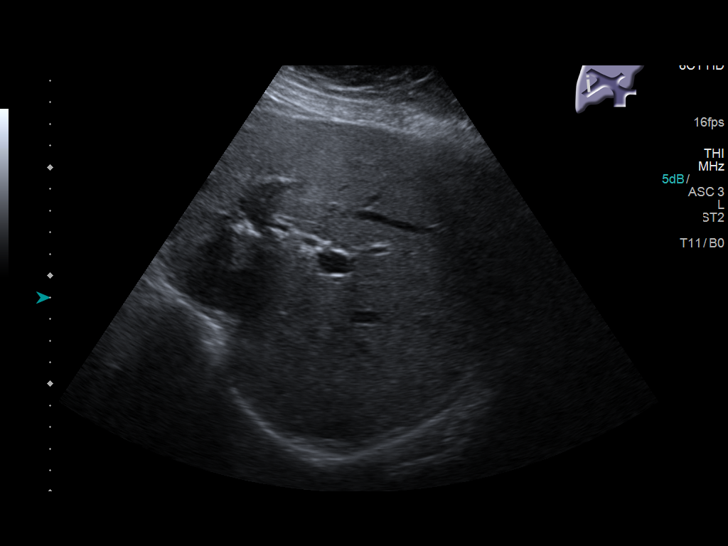
[im 49/74]
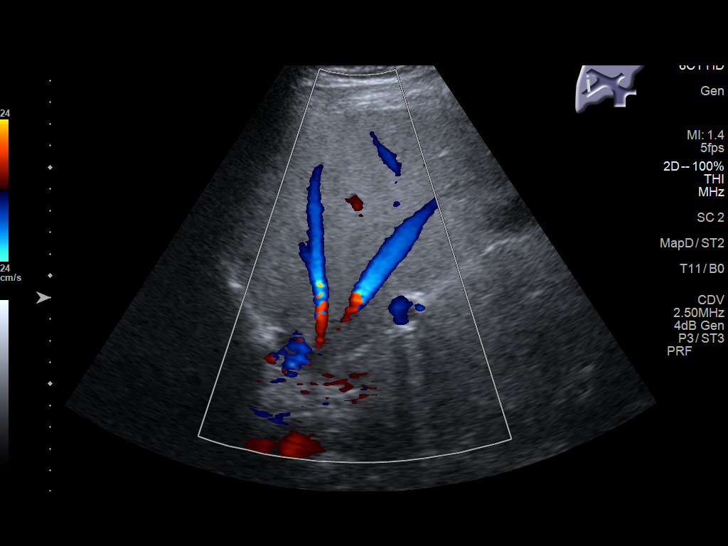
[im 55/74]
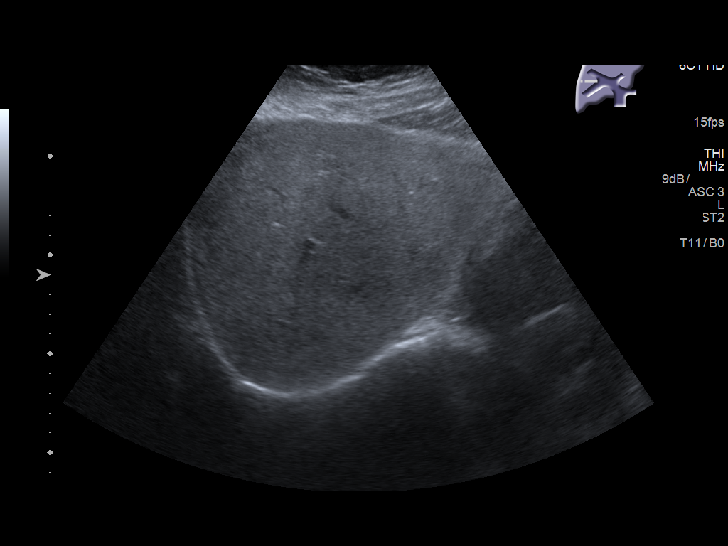
[im 61/74]
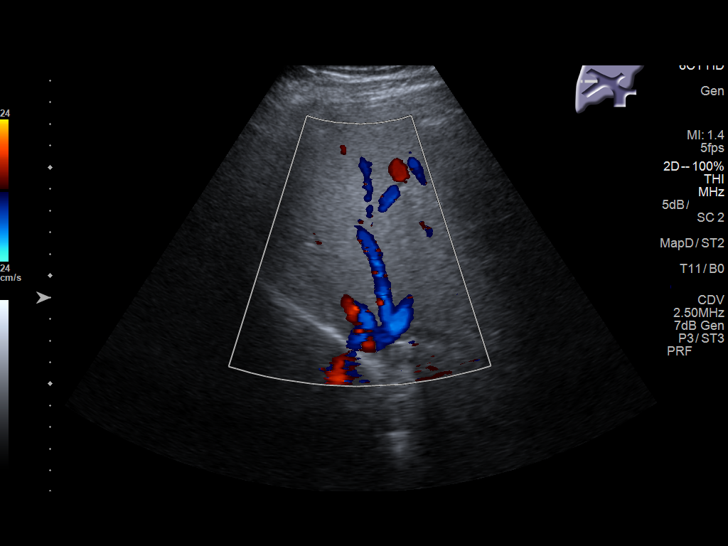
[im 67/74]
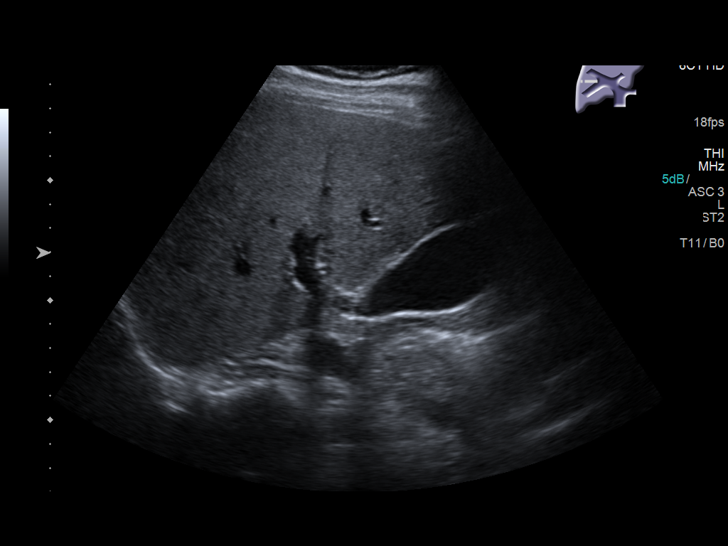
[im 74/74]
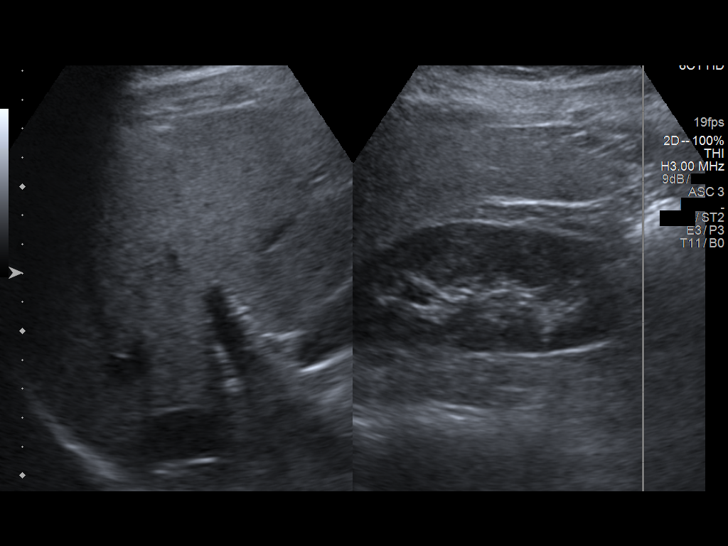

[14 of 25 positions shown; findings below may reference images not displayed]

FINDINGS: Gallbladder:

No gallstones or wall thickening visualized. No sonographic Murphy
sign noted by sonographer.

Common bile duct:

Diameter: 3 mm

Liver:

No focal lesion identified. The parenchymal echotexture is mildly
increased. The surface contour is smooth. There is no focal mass or
ductal dilation. Portal vein is patent on color Doppler imaging with
normal direction of blood flow towards the liver.
IMPRESSION: Increased hepatic echotexture most compatible with fatty
infiltrative change. Normal appearance of the gallbladder and common
bile duct.

Given the patient's history of duodenitis-pancreatitis, abdominal
and pelvic CT scanning would be a useful next imaging step.

## 2019-04-09 NOTE — Addendum Note (Signed)
Addended by: Mar Daring on: 04/09/2019 11:50 AM   Modules accepted: Orders

## 2019-04-23 ENCOUNTER — Telehealth: Payer: Self-pay

## 2019-04-23 NOTE — Telephone Encounter (Signed)
Copied from [place] [id number]. Topic: Clinical - COVID Pre-Screen >> Apr 23, 2019  4:14 PM Mathis Bud wrote: 1. To the best of your knowledge, have you been in close contact with anyone with a confirmed diagnosis of COVID 19? NO  2. Have you had any one or more of the following: fever, chills, cough, shortness of breath or any flu-like symptoms?  NO  3. Have you been diagnosed with or have a previous diagnosis of COVID 19?  NO   4. I am going to go over a few other symptoms with you. Please let me know if you are experiencing any of the following:  NO
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
## 2019-04-24 ENCOUNTER — Ambulatory Visit (INDEPENDENT_AMBULATORY_CARE_PROVIDER_SITE_OTHER): Payer: No Typology Code available for payment source | Admitting: Physician Assistant

## 2019-04-24 ENCOUNTER — Other Ambulatory Visit: Payer: Self-pay

## 2019-04-24 ENCOUNTER — Encounter: Payer: Self-pay | Admitting: Physician Assistant

## 2019-04-24 VITALS — BP 140/100 | HR 88 | Temp 97.5°F | Resp 16 | Ht 66.0 in | Wt 149.0 lb

## 2019-04-24 DIAGNOSIS — F3176 Bipolar disorder, in full remission, most recent episode depressed: Secondary | ICD-10-CM

## 2019-04-24 DIAGNOSIS — M62838 Other muscle spasm: Secondary | ICD-10-CM

## 2019-04-24 DIAGNOSIS — R748 Abnormal levels of other serum enzymes: Secondary | ICD-10-CM

## 2019-04-24 DIAGNOSIS — Z0000 Encounter for general adult medical examination without abnormal findings: Secondary | ICD-10-CM | POA: Diagnosis not present

## 2019-04-24 DIAGNOSIS — F419 Anxiety disorder, unspecified: Secondary | ICD-10-CM | POA: Diagnosis not present

## 2019-04-24 DIAGNOSIS — J301 Allergic rhinitis due to pollen: Secondary | ICD-10-CM

## 2019-04-24 DIAGNOSIS — Z8719 Personal history of other diseases of the digestive system: Secondary | ICD-10-CM | POA: Insufficient documentation

## 2019-04-24 DIAGNOSIS — F17200 Nicotine dependence, unspecified, uncomplicated: Secondary | ICD-10-CM

## 2019-04-24 MED ORDER — BACLOFEN 20 MG PO TABS: 20.0000 mg | ORAL_TABLET | Freq: Three times a day (TID) | ORAL | 1 refills | Status: DC

## 2019-04-24 MED ORDER — FLUTICASONE PROPIONATE 50 MCG/ACT NA SUSP: 2.0000 | Freq: Every day | NASAL | 1 refills | Status: DC

## 2019-04-24 NOTE — Patient Instructions (Signed)
Health Maintenance, Female Adopting a healthy lifestyle and getting preventive care are important in promoting health and wellness. Ask your health care provider about:  The right schedule for you to have regular tests and exams.  Things you can do on your own to prevent diseases and keep yourself healthy. What should I know about diet, weight, and exercise? Eat a healthy diet   Eat a diet that includes plenty of vegetables, fruits, low-fat dairy products, and lean protein.  Do not eat a lot of foods that are high in solid fats, added sugars, or sodium. Maintain a healthy weight Body mass index (BMI) is used to identify weight problems. It estimates body fat based on height and weight. Your health care provider can help determine your BMI and help you achieve or maintain a healthy weight. Get regular exercise Get regular exercise. This is one of the most important things you can do for your health. Most adults should:  Exercise for at least 150 minutes each week. The exercise should increase your heart rate and make you sweat (moderate-intensity exercise).  Do strengthening exercises at least twice a week. This is in addition to the moderate-intensity exercise.  Spend less time sitting. Even light physical activity can be beneficial. Watch cholesterol and blood lipids Have your blood tested for lipids and cholesterol at 35 years of age, then have this test every 5 years. Have your cholesterol levels checked more often if:  Your lipid or cholesterol levels are high.  You are older than 35 years of age.  You are at high risk for heart disease. What should I know about cancer screening? Depending on your health history and family history, you may need to have cancer screening at various ages. This may include screening for:  Breast cancer.  Cervical cancer.  Colorectal cancer.  Skin cancer.  Lung cancer. What should I know about heart disease, diabetes, and high blood  pressure? Blood pressure and heart disease  High blood pressure causes heart disease and increases the risk of stroke. This is more likely to develop in people who have high blood pressure readings, are of African descent, or are overweight.  Have your blood pressure checked: ? Every 3-5 years if you are 18-39 years of age. ? Every year if you are 40 years old or older. Diabetes Have regular diabetes screenings. This checks your fasting blood sugar level. Have the screening done:  Once every three years after age 40 if you are at a normal weight and have a low risk for diabetes.  More often and at a younger age if you are overweight or have a high risk for diabetes. What should I know about preventing infection? Hepatitis B If you have a higher risk for hepatitis B, you should be screened for this virus. Talk with your health care provider to find out if you are at risk for hepatitis B infection. Hepatitis C Testing is recommended for:  Everyone born from 1945 through 1965.  Anyone with known risk factors for hepatitis C. Sexually transmitted infections (STIs)  Get screened for STIs, including gonorrhea and chlamydia, if: ? You are sexually active and are younger than 35 years of age. ? You are older than 35 years of age and your health care provider tells you that you are at risk for this type of infection. ? Your sexual activity has changed since you were last screened, and you are at increased risk for chlamydia or gonorrhea. Ask your health care provider if   you are at risk.  Ask your health care provider about whether you are at high risk for HIV. Your health care provider may recommend a prescription medicine to help prevent HIV infection. If you choose to take medicine to prevent HIV, you should first get tested for HIV. You should then be tested every 3 months for as long as you are taking the medicine. Pregnancy  If you are about to stop having your period (premenopausal) and  you may become pregnant, seek counseling before you get pregnant.  Take 400 to 800 micrograms (mcg) of folic acid every day if you become pregnant.  Ask for birth control (contraception) if you want to prevent pregnancy. Osteoporosis and menopause Osteoporosis is a disease in which the bones lose minerals and strength with aging. This can result in bone fractures. If you are 65 years old or older, or if you are at risk for osteoporosis and fractures, ask your health care provider if you should:  Be screened for bone loss.  Take a calcium or vitamin D supplement to lower your risk of fractures.  Be given hormone replacement therapy (HRT) to treat symptoms of menopause. Follow these instructions at home: Lifestyle  Do not use any products that contain nicotine or tobacco, such as cigarettes, e-cigarettes, and chewing tobacco. If you need help quitting, ask your health care provider.  Do not use street drugs.  Do not share needles.  Ask your health care provider for help if you need support or information about quitting drugs. Alcohol use  Do not drink alcohol if: ? Your health care provider tells you not to drink. ? You are pregnant, may be pregnant, or are planning to become pregnant.  If you drink alcohol: ? Limit how much you use to 0-1 drink a day. ? Limit intake if you are breastfeeding.  Be aware of how much alcohol is in your drink. In the [place] one drink equals one 12 oz bottle of beer (355 mL), one 5 oz glass of wine (148 mL), or one 1 oz glass of hard liquor (44 mL). General instructions  Schedule regular health, dental, and eye exams.  Stay current with your vaccines.  Tell your health care provider if: ? You often feel depressed. ? You have ever been abused or do not feel safe at home. Summary  Adopting a healthy lifestyle and getting preventive care are important in promoting health and wellness.  Follow your health care provider's instructions about healthy  diet, exercising, and getting tested or screened for diseases.  Follow your health care provider's instructions on monitoring your cholesterol and blood pressure. This information is not intended to replace advice given to you by your health care provider. Make sure you discuss any questions you have with your health care provider. Document Released: 11/16/2010 Document Revised: 04/26/2018 Document Reviewed: 04/26/2018 Elsevier Patient Education  2020 [employer] Inc[employer]  

## 2019-04-26 ENCOUNTER — Other Ambulatory Visit: Payer: Self-pay

## 2019-04-26 ENCOUNTER — Other Ambulatory Visit: Payer: No Typology Code available for payment source

## 2019-04-26 DIAGNOSIS — Z1321 Encounter for screening for nutritional disorder: Secondary | ICD-10-CM

## 2019-04-26 DIAGNOSIS — Z131 Encounter for screening for diabetes mellitus: Secondary | ICD-10-CM

## 2019-04-27 ENCOUNTER — Encounter: Payer: Self-pay | Admitting: Physician Assistant

## 2019-04-27 ENCOUNTER — Other Ambulatory Visit: Payer: Self-pay | Admitting: Obstetrics and Gynecology

## 2019-04-27 DIAGNOSIS — Z131 Encounter for screening for diabetes mellitus: Secondary | ICD-10-CM

## 2019-04-27 LAB — CBC WITH DIFFERENTIAL/PLATELET
Basophils Absolute: 0 10*3/uL (ref 0.0–0.2)
Basos: 0 %
EOS (ABSOLUTE): 0.1 10*3/uL (ref 0.0–0.4)
Eos: 1 %
Hematocrit: 41.3 % (ref 34.0–46.6)
Hemoglobin: 14.8 g/dL (ref 11.1–15.9)
Immature Grans (Abs): 0 10*3/uL (ref 0.0–0.1)
Immature Granulocytes: 0 %
Lymphocytes Absolute: 2.2 10*3/uL (ref 0.7–3.1)
Lymphs: 23 %
MCH: 35.2 pg — ABNORMAL HIGH (ref 26.6–33.0)
MCHC: 35.8 g/dL — ABNORMAL HIGH (ref 31.5–35.7)
MCV: 98 fL — ABNORMAL HIGH (ref 79–97)
Monocytes Absolute: 0.4 10*3/uL (ref 0.1–0.9)
Monocytes: 5 %
Neutrophils Absolute: 6.8 10*3/uL (ref 1.4–7.0)
Neutrophils: 71 %
Platelets: 257 10*3/uL (ref 150–450)
RBC: 4.21 x10E6/uL (ref 3.77–5.28)
RDW: 11.4 % — ABNORMAL LOW (ref 11.7–15.4)
WBC: 9.6 10*3/uL (ref 3.4–10.8)

## 2019-04-27 LAB — COMPREHENSIVE METABOLIC PANEL
ALT: 12 IU/L (ref 0–32)
AST: 17 IU/L (ref 0–40)
Albumin/Globulin Ratio: 2 (ref 1.2–2.2)
Albumin: 4.5 g/dL (ref 3.8–4.8)
Alkaline Phosphatase: 61 IU/L (ref 39–117)
BUN/Creatinine Ratio: 9 (ref 9–23)
BUN: 8 mg/dL (ref 6–20)
Bilirubin Total: 0.4 mg/dL (ref 0.0–1.2)
CO2: 23 mmol/L (ref 20–29)
Calcium: 9.6 mg/dL (ref 8.7–10.2)
Chloride: 102 mmol/L (ref 96–106)
Creatinine, Ser: 0.88 mg/dL (ref 0.57–1.00)
GFR calc Af Amer: 98 mL/min/{1.73_m2} (ref >59)
GFR calc non Af Amer: 85 mL/min/{1.73_m2} (ref >59)
Globulin, Total: 2.2 g/dL (ref 1.5–4.5)
Glucose: 70 mg/dL (ref 65–99)
Potassium: 4.3 mmol/L (ref 3.5–5.2)
Sodium: 140 mmol/L (ref 134–144)
Total Protein: 6.7 g/dL (ref 6.0–8.5)

## 2019-04-27 LAB — LIPID PANEL
Chol/HDL Ratio: 2.5 ratio (ref 0.0–4.4)
Cholesterol, Total: 162 mg/dL (ref 100–199)
HDL: 65 mg/dL (ref >39)
LDL Chol Calc (NIH): 81 mg/dL (ref 0–99)
Triglycerides: 89 mg/dL (ref 0–149)
VLDL Cholesterol Cal: 16 mg/dL (ref 5–40)

## 2019-04-27 LAB — VITAMIN D 25 HYDROXY (VIT D DEFICIENCY, FRACTURES): Vit D, 25-Hydroxy: 21.3 ng/mL — ABNORMAL LOW (ref 30.0–100.0)

## 2019-04-27 LAB — HEMOGLOBIN A1C
Est. average glucose Bld gHb Est-mCnc: 252 mg/dL
Hgb A1c MFr Bld: 10.4 % — ABNORMAL HIGH (ref 4.8–5.6)

## 2019-04-27 LAB — TSH: TSH: 2.19 u[IU]/mL (ref 0.450–4.500)

## 2019-04-27 LAB — LIPASE: Lipase: 27 U/L (ref 14–72)

## 2019-04-28 ENCOUNTER — Encounter: Payer: Self-pay | Admitting: Physician Assistant

## 2019-04-28 LAB — HEMOGLOBIN A1C
Est. average glucose Bld gHb Est-mCnc: 94 mg/dL
Hgb A1c MFr Bld: 4.9 % (ref 4.8–5.6)

## 2019-05-04 ENCOUNTER — Ambulatory Visit: Payer: No Typology Code available for payment source | Admitting: Physician Assistant

## 2019-05-24 ENCOUNTER — Ambulatory Visit: Payer: No Typology Code available for payment source | Admitting: Obstetrics and Gynecology

## 2019-05-30 ENCOUNTER — Encounter: Payer: Self-pay | Admitting: Obstetrics and Gynecology

## 2019-05-30 ENCOUNTER — Ambulatory Visit (INDEPENDENT_AMBULATORY_CARE_PROVIDER_SITE_OTHER): Payer: No Typology Code available for payment source | Admitting: Obstetrics and Gynecology

## 2019-05-30 ENCOUNTER — Other Ambulatory Visit (HOSPITAL_COMMUNITY)
Admission: RE | Admit: 2019-05-30 | Discharge: 2019-05-30 | Disposition: A | Discharge status: Home / Self Care | Payer: No Typology Code available for payment source | Source: Ambulatory Visit | Attending: Obstetrics and Gynecology | Admitting: Obstetrics and Gynecology

## 2019-05-30 ENCOUNTER — Other Ambulatory Visit: Payer: Self-pay

## 2019-05-30 ENCOUNTER — Ambulatory Visit: Payer: No Typology Code available for payment source | Admitting: Obstetrics and Gynecology

## 2019-05-30 VITALS — BP 144/100 | Ht 66.0 in | Wt 150.0 lb

## 2019-05-30 DIAGNOSIS — Z1331 Encounter for screening for depression: Secondary | ICD-10-CM

## 2019-05-30 DIAGNOSIS — Z01419 Encounter for gynecological examination (general) (routine) without abnormal findings: Secondary | ICD-10-CM | POA: Diagnosis not present

## 2019-05-30 DIAGNOSIS — Z113 Encounter for screening for infections with a predominantly sexual mode of transmission: Secondary | ICD-10-CM | POA: Diagnosis present

## 2019-05-30 DIAGNOSIS — Z1339 Encounter for screening examination for other mental health and behavioral disorders: Secondary | ICD-10-CM
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
## 2019-06-01 LAB — CERVICOVAGINAL ANCILLARY ONLY
Chlamydia: NEGATIVE
Comment: NEGATIVE
Comment: NORMAL
Neisseria Gonorrhea: NEGATIVE

## 2019-07-04 NOTE — Telephone Encounter (Signed)
Mirena rcvd/charged 06/21/2018

## 2019-08-22 ENCOUNTER — Other Ambulatory Visit: Payer: Self-pay

## 2019-08-22 ENCOUNTER — Ambulatory Visit: Payer: No Typology Code available for payment source | Admitting: Dermatology

## 2019-08-22 DIAGNOSIS — L709 Acne, unspecified: Secondary | ICD-10-CM | POA: Diagnosis not present

## 2019-08-22 DIAGNOSIS — D485 Neoplasm of uncertain behavior of skin: Secondary | ICD-10-CM | POA: Diagnosis not present

## 2019-08-22 DIAGNOSIS — D225 Melanocytic nevi of trunk: Secondary | ICD-10-CM

## 2019-08-22 DIAGNOSIS — D229 Melanocytic nevi, unspecified: Secondary | ICD-10-CM | POA: Diagnosis not present

## 2019-08-22 DIAGNOSIS — Z1283 Encounter for screening for malignant neoplasm of skin: Secondary | ICD-10-CM

## 2019-08-22 DIAGNOSIS — L814 Other melanin hyperpigmentation: Secondary | ICD-10-CM

## 2019-08-22 DIAGNOSIS — L578 Other skin changes due to chronic exposure to nonionizing radiation: Secondary | ICD-10-CM

## 2019-08-22 DIAGNOSIS — D489 Neoplasm of uncertain behavior, unspecified: Secondary | ICD-10-CM

## 2019-08-22 NOTE — Progress Notes (Signed)
New Patient Visit  Subjective  Ashley Suarez is a 34 y.o. female who presents for the following: Skin cancer screening for new patient.  She denies a history of skin cancer.  Her great uncle had a skin cancer.  She has a mole at the left since december, no itch or pain, no treatment, no known changes.  She has had a pink spot on nose for a year without itch or pain.  Has never had a TBSE   Objective  Well appearing patient in no apparent distress; mood and affect are within normal limits.  A full examination was performed including scalp, head, eyes, ears, nose, lips, neck, chest, axillae, abdomen, back, buttocks, bilateral upper extremities, bilateral lower extremities, hands, feet, fingers, toes, fingernails, and toenails. All findings within normal limits unless otherwise noted below.  Objective  Left Anterior Mandible: Few inflammatory papules along jaw line  Objective  Left Nasal Sidewall: 0.5 cm pink macule with central 0.2 cm papule   Objective  Upper Back Left of midline: 0.4cm dark brown thin papule  Objective  Left Inframammary Fold: Left breast at o'clock. 1.3 cm x 0.9 cm Thin brown plaque    Right Breast: 1.0 cm med brown plaque at 3 o'clock  Assessment & Plan  Acne, unspecified acne type Left Anterior Mandible  Patient defers treatment Patient uses Cetaphil wash daily  Neoplasm of uncertain behavior (2) Left Nasal Sidewall  Upper Back Left of midline  Epidermal / dermal shaving  Lesion length (cm):  0.4 Lesion width (cm):  0.4 Margin per side (cm):  0.2 Total excision diameter (cm):  0.8 Informed consent: discussed and consent obtained   Timeout: patient name, date of birth, surgical site, and procedure verified   Anesthesia: the lesion was anesthetized in a standard fashion   Anesthetic:  1% lidocaine w/ epinephrine 1-100,000 buffered w/ 8.4% NaHCO3 Instrument used: flexible razor blade   Outcome: patient tolerated procedure well     Post-procedure details: sterile dressing applied and wound care instructions given   Dressing type: petrolatum and pressure dressing    Specimen 1 - Surgical pathology Differential Diagnosis: R/O Atypia Check Margins: No 0.4cm dark brown thin papule  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 Call right away if she notices any changes  Upper back left of midline - R/O Atypia    Nevus (2) Right Breast; Left Inframammary Fold  Left breast Benign appearing Photo today and recheck at f/u, call right away with any changes   Right breast Benign-appearing.  Recheck at follow-up.  Call clinic for new or changing moles.    Recommend daily use of broad spectrum spf 30+ sunscreen to sun-exposed areas.   Lentigines - Scattered tan macules - Discussed due to sun exposure - Benign, observe - Call for any changes  Melanocytic Nevi - Tan-brown and/or pink-flesh-colored symmetric macules and papules - Benign appearing on exam today - Observation - Call clinic for new or changing moles - Recommend daily use of broad spectrum spf 30+ sunscreen to sun-exposed areas.   Actinic Damage - diffuse scaly erythematous macules with underlying dyspigmentation - Recommend daily broad spectrum sunscreen SPF 30+ to sun-exposed areas, reapply every 2 hours as needed.  - Call for new or changing lesions.  Skin cancer screening performed today.   Return in about 4 months (around 12/22/2019).  To recheck moles.   IDonzetta Kohut, CMA, am acting as scribe for Forest Gleason, MD .  Documentation:  I have reviewed the above documentation for accuracy and completeness, and I agree with the above.  Forest Gleason, MD

## 2019-08-22 NOTE — Patient Instructions (Addendum)
Shave Excision Benign Lesion Wound Care Instructions  . Leave the original bandage on for 24 hours if possible.  If the bandage becomes soaked or soiled before that time, it is OK to remove it and examine the wound.  A small amount of post-operative bleeding is normal.  If excessive bleeding occurs, remove the bandage, place gauze over the site and apply continuous pressure (no peeking) over the area for 20-30 minutes.  If this does not stop the bleeding, try again for 40 minutes.  If this does not work, please call our clinic as soon as possible (even if after-hours).    . Twice a day, cleanse the wound with soap and water.  If a thick crust develops you may use a Q-tip dipped into dilute hydrogen peroxide (mix 1:1 with water) to dissolve it.  Hydrogen peroxide can slow the healing process, so use it only as needed.  After washing, apply Vaseline jelly or Polysporin ointment.  For best healing, the wound should be covered with a layer of ointment at all times.  This may mean re-applying the ointment several times a day.  For open wounds, continue until it has healed.    . If you have any swelling, keep the area elevated.  . Some redness, tenderness and white or yellow material in the wound is normal healing.  If the area becomes very sore and red, or develops a thick yellow-green material (pus), it may be infected; please notify us.    . Wound healing continues for up to one year following surgery.  It is not unusual to experience pain in the scar from time to time during the interval.  If the pain becomes severe or the scar thickens, you should notify the office.  A slight amount of redness in a scar is expected for the first six months.  After six months, the redness subsides and the scar will soften and fade.  The color difference becomes less noticeable with time.  If there are any problems, return for a post-op surgery check at your earliest convenience.  . Please call our office for any questions  or concerns.   Recommend daily broad spectrum sunscreen SPF 30+ to sun-exposed areas, reapply every 2 hours as needed. Call for new or changing lesions.

## 2019-08-25 ENCOUNTER — Encounter: Payer: Self-pay | Admitting: Dermatology

## 2019-08-28 NOTE — Progress Notes (Signed)
Skin , upper back left of midline DYSPLASTIC JUNCTIONAL LENTIGINOUS NEVUS WITH MODERATE ATYPIA, LIMITED MARGINS FREE  This is a MODERATELY ATYPICAL MOLE. On the spectrum from normal mole to melanoma skin cancer, this is in between the two. - We need to recheck this area at your 4 month recheck (to check the other two spots) to be sure there is no evidence of the atypical mole coming back. If there is any color coming back, we would recommend repeating the biopsy to be sure the cells look normal.  - People who have a history of atypical moles do have a slightly increased risk of developing melanoma somewhere on the body, so a yearly full body skin exam by a dermatologist is recommended.  - Monthly self skin checks and daily sun protection are also recommended.  - Please call if you notice a dark spot coming back where this biopsy was taken.  - Please also call if you notice any new or changing spots anywhere else on the body before your follow-up visit.   MAs please call and notify patient, and also schedule for the 4 month follow-up to recheck other spots.   Unfortunately, there was an error with the Epic photo upload and the photos are not available. If she is able to stop by for a no charge nurse visit in the next couple of weeks to photograph the spots we are following at her nose and breast, that would be great. Otherwise, I have a measurement and description which we will use. Thank you

## 2019-09-12 ENCOUNTER — Encounter: Payer: Self-pay | Admitting: Physician Assistant

## 2019-09-12 DIAGNOSIS — R110 Nausea: Secondary | ICD-10-CM

## 2019-09-12 MED ORDER — ONDANSETRON HCL 4 MG PO TABS: 4.0000 mg | ORAL_TABLET | Freq: Three times a day (TID) | ORAL | 0 refills | Status: DC | PRN

## 2019-09-13 ENCOUNTER — Telehealth: Payer: Self-pay | Admitting: Physician Assistant

## 2019-09-13 ENCOUNTER — Encounter: Payer: Self-pay | Admitting: Physician Assistant

## 2019-09-13 ENCOUNTER — Other Ambulatory Visit: Payer: Self-pay

## 2019-09-13 ENCOUNTER — Telehealth (INDEPENDENT_AMBULATORY_CARE_PROVIDER_SITE_OTHER): Payer: No Typology Code available for payment source | Admitting: Physician Assistant

## 2019-09-13 DIAGNOSIS — K8500 Idiopathic acute pancreatitis without necrosis or infection: Secondary | ICD-10-CM

## 2019-09-13 DIAGNOSIS — R110 Nausea: Secondary | ICD-10-CM | POA: Diagnosis not present

## 2019-09-13 MED ORDER — ONDANSETRON HCL 4 MG PO TABS: 4.0000 mg | ORAL_TABLET | Freq: Three times a day (TID) | ORAL | 0 refills | Status: DC | PRN

## 2019-09-13 MED ORDER — OXYCODONE-ACETAMINOPHEN 5-325 MG PO TABS: 1.0000 | ORAL_TABLET | ORAL | 0 refills | Status: DC | PRN

## 2019-09-13 NOTE — Telephone Encounter (Signed)
Patient Had a telephone visit with the provider instead.

## 2019-09-13 NOTE — Patient Instructions (Signed)
Pancreatitis Eating Plan Pancreatitis is when your pancreas becomes irritated and swollen (inflamed). The pancreas is a small organ located behind your stomach. It helps your body digest food and regulate your blood sugar. Pancreatitis can affect how your body digests food, especially foods with fat. You may also have other symptoms such as abdominal pain or nausea. When you have pancreatitis, following a low-fat eating plan may help you manage symptoms and recover more quickly. Work with your health care provider or a diet and nutrition specialist (dietitian) to create an eating plan that is right for you. What are tips for following this plan? Reading food labels Use the information on food labels to help keep track of how much fat you eat:  Check the serving size.  Look for the amount of total fat in grams (g) in one serving. ? Low-fat foods have 3 g of fat or less per serving. ? Fat-free foods have 0.5 g of fat or less per serving.  Keep track of how much fat you eat based on how many servings you eat. ? For example, if you eat two servings, the amount of fat you eat will be two times what is listed on the label. Shopping   Buy low-fat or nonfat foods, such as: ? Fresh, frozen, or canned fruits and vegetables. ? Grains, including pasta, bread, and rice. ? Lean meat, poultry, fish, and other protein foods. ? Low-fat or nonfat dairy.  Avoid buying bakery products and other sweets made with whole milk, butter, and eggs.  Avoid buying snack foods with added fat, such as anything with butter or cheese flavoring. Cooking  Remove skin from poultry, and remove extra fat from meat.  Limit the amount of fat and oil you use to 6 teaspoons or less per day.  Cook using low-fat methods, such as boiling, broiling, grilling, steaming, or baking.  Use spray oil to cook. Add fat-free chicken broth to add flavor and moisture.  Avoid adding cream to thicken soups or sauces. Use other  thickeners such as corn starch or tomato paste. Meal planning   Eat a low-fat diet as told by your dietitian. For most people, this means having no more than 55-65 grams of fat each day.  Eat small, frequent meals throughout the day. For example, you may have 5-6 small meals instead of 3 large meals.  Drink enough fluid to keep your urine pale yellow.  Do not drink alcohol. Talk to your health care provider if you need help stopping.  Limit how much caffeine you have, including black coffee, black and green tea, caffeinated soft drinks, and energy drinks. General information  Let your health care provider or dietitian know if you have unplanned weight loss on this eating plan.  You may be instructed to follow a clear liquid diet during a flare of symptoms. Talk with your health care provider about how to manage your diet during symptoms of a flare.  Take any vitamins or supplements as told by your health care provider.  Work with a dietitian, especially if you have other conditions such as obesity or diabetes mellitus. What foods should I avoid? Fruits Fried fruits. Fruits served with butter or cream. Vegetables Fried vegetables. Vegetables cooked with butter, cheese, or cream. Grains Biscuits, waffles, donuts, pastries, and croissants. Pies and cookies. Butter-flavored popcorn. Regular crackers. Meats and other protein foods Fatty cuts of meat. Poultry with skin. Organ meats. Bacon, sausage, and cold cuts. Whole eggs. Nuts and nut butters. Dairy Whole and   2% milk. Whole milk yogurt. Whole milk ice cream. Cream and half-and-half. Cream cheese. Sour cream. Cheese. Beverages Wine, beer, and liquor. The items listed above may not be a complete list of foods and beverages to avoid. Contact a dietitian for more information. Summary  Pancreatitis can affect how your body digests food, especially foods with fat.  When you have pancreatitis, it is recommended that you follow a low-fat  eating plan to help you recover more quickly and manage symptoms. For most people, this means limiting fat to no more than 55-65 grams per day.  Do not drink alcohol. Limit the amount of caffeine you have, and drink enough fluid to keep your urine pale yellow. This information is not intended to replace advice given to you by your health care provider. Make sure you discuss any questions you have with your health care provider. Document Revised: 08/24/2018 Document Reviewed: 08/09/2017 Elsevier Patient Education  2020 Elsevier Inc.  

## 2019-09-13 NOTE — Telephone Encounter (Signed)
Patient is have problem with the link in her mychart account- she is receiving a message that there is a problem with her server. Patient did not want to miss her appt. Please advise CB- [phone]
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
## 2019-09-21 ENCOUNTER — Encounter: Payer: Self-pay | Admitting: Dermatology

## 2019-10-02 IMAGING — MR MR LUMBAR SPINE W/O CM
4 of 5 series · 26 of 48 positions shown · non-contrast
Comparison: None.

CLINICAL DATA: Pelvic pain with sitting. Right sciatica. Occasional
left sciatica. Intermittent symptoms over the last 2 years.

EXAM:
MRI LUMBAR SPINE WITHOUT CONTRAST
TECHNIQUE: Multiplanar, multisequence MR imaging of the lumbar spine was
performed. No intravenous contrast was administered.

[Series 2: T2 · sagittal · 4.0mm · 0.81mm/px · 6 of 15 slices shown (1 of 2)]
[im 1/15]
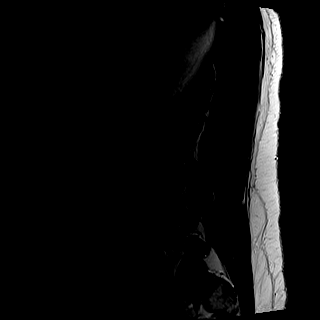
[im 3/15]
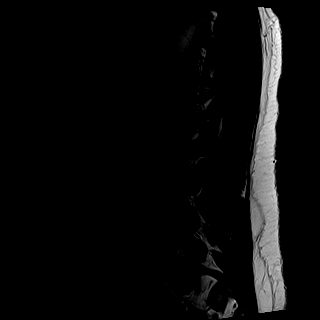
[im 6/15]
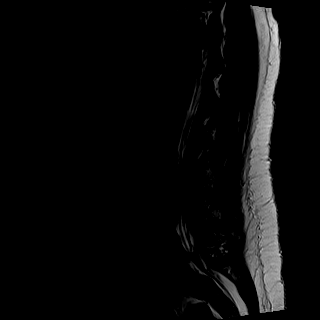
[im 9/15]
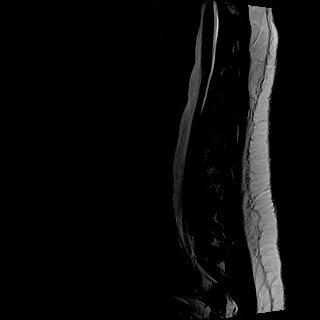
[im 12/15]
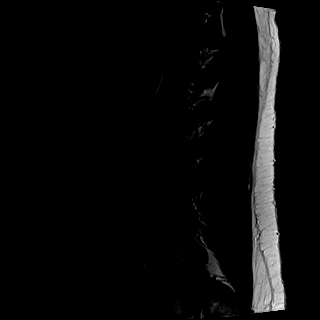
[im 15/15]
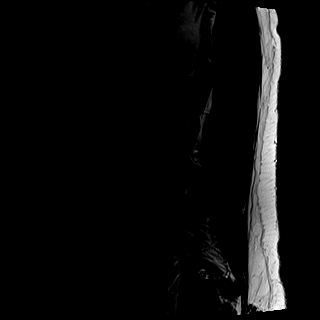

[Series 3: T1 · sagittal · 4.0mm · 0.41mm/px · 6 of 15 slices shown (1 of 2)]
[im 1/15]
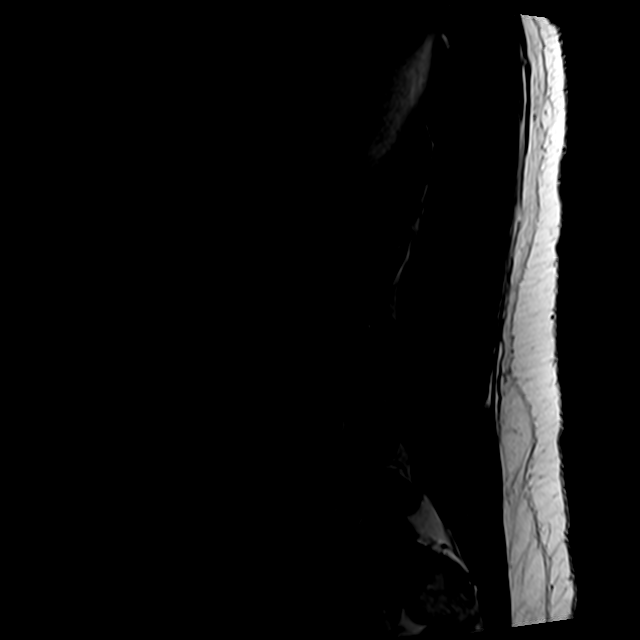
[im 3/15]
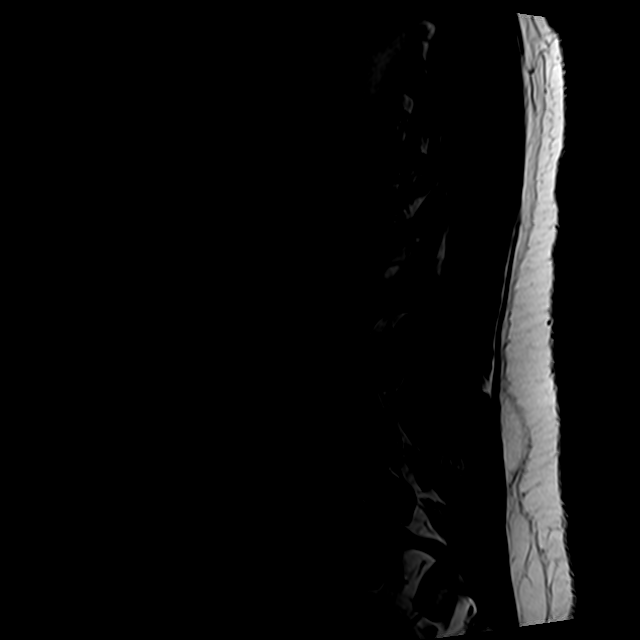
[im 6/15]
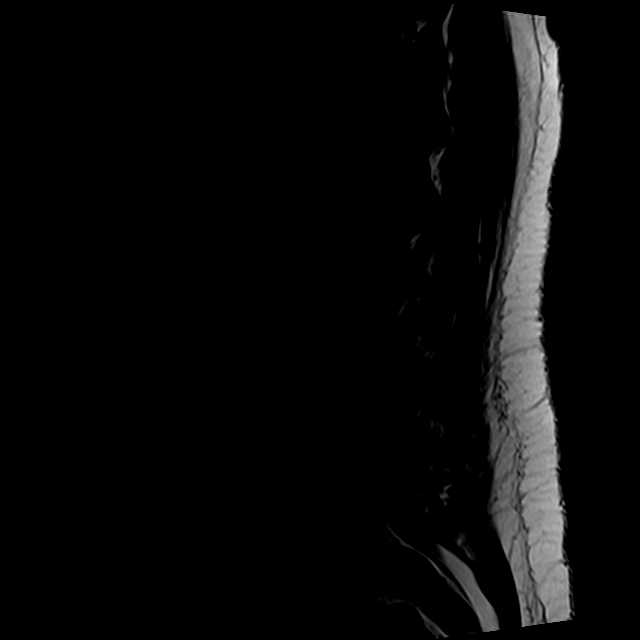
[im 9/15]
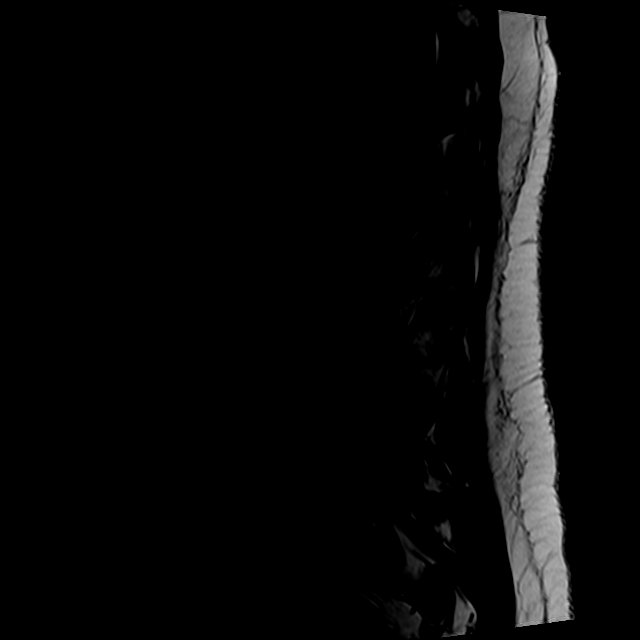
[im 12/15]
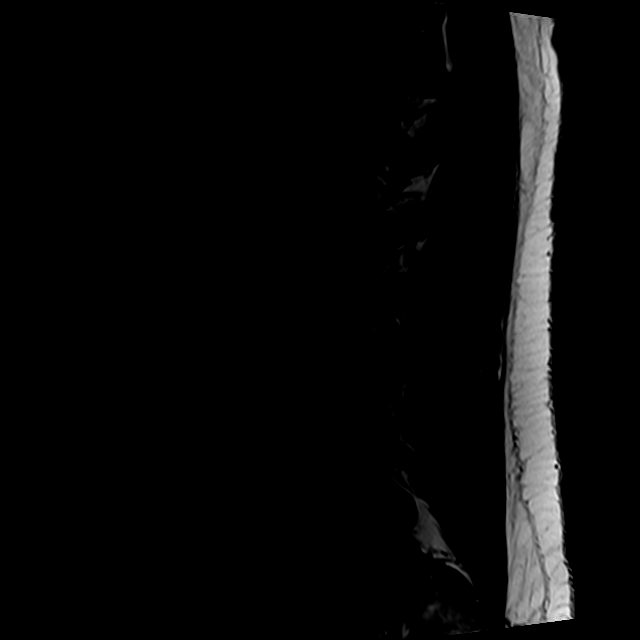
[im 15/15]
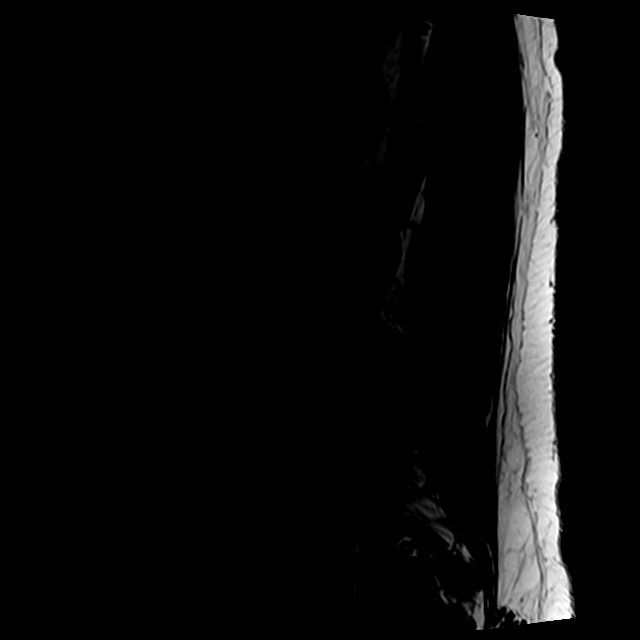

[Series 5: T2 · axial · 4.0mm · 0.78mm/px · z∈[-56,+160]mm · 9 of 39 slices shown (2 of 2)]
[im 1/39]
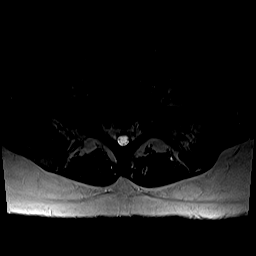
[im 6/39]
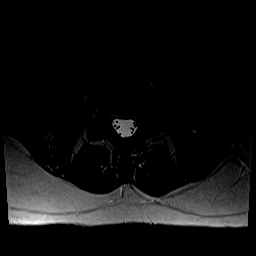
[im 11/39]
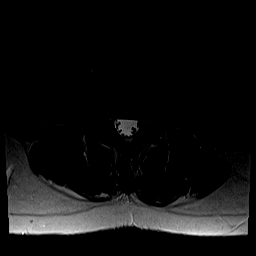
[im 17/39]
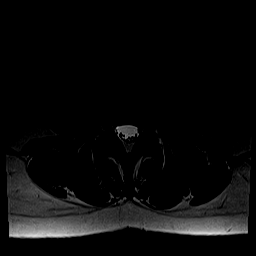
[im 20/39]
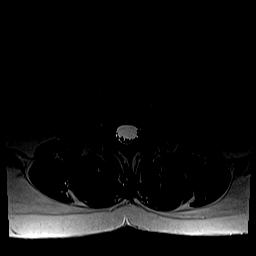
[im 22/39]
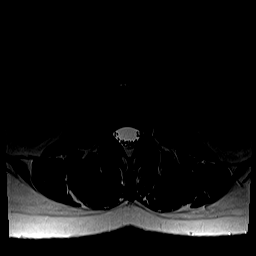
[im 28/39]
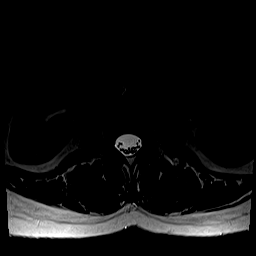
[im 33/39]
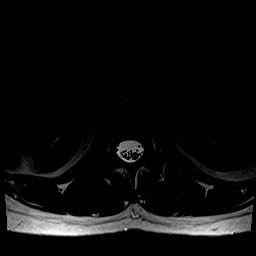
[im 39/39]
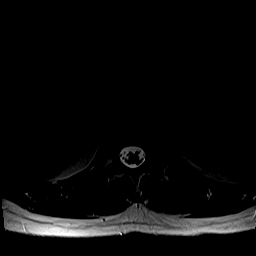

[Series 6: T1 · axial · 4.0mm · 0.39mm/px · z∈[-56,+130]mm · 5 of 39 slices shown (2 of 2)]
[im 1/39]
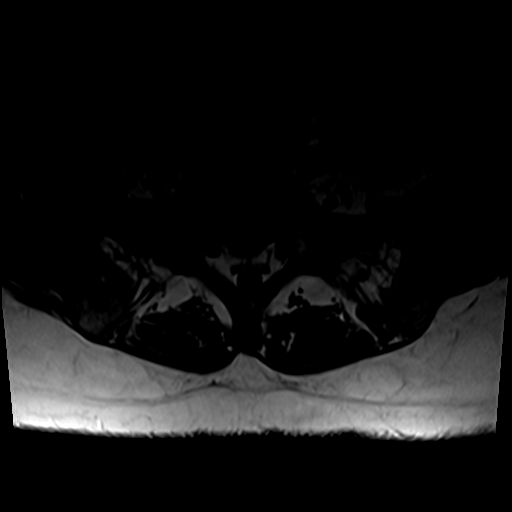
[im 6/39]
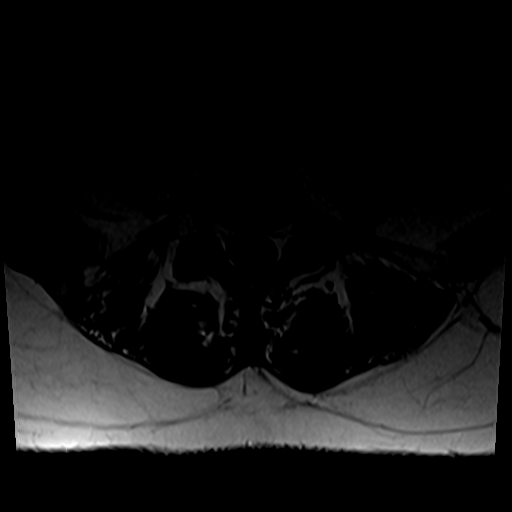
[im 11/39]
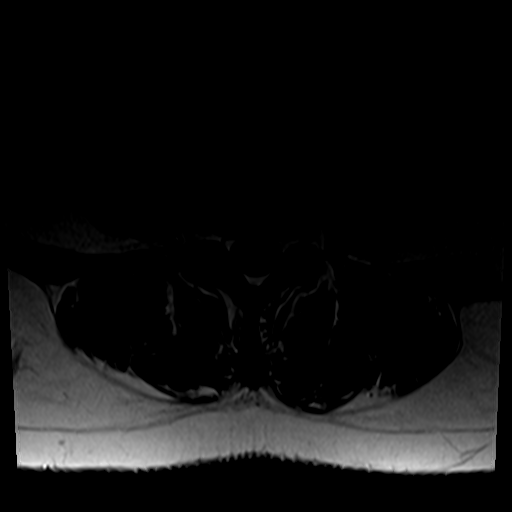
[im 20/39]
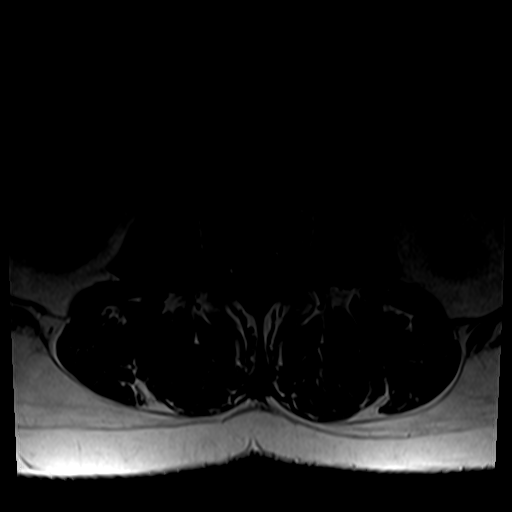
[im 33/39]
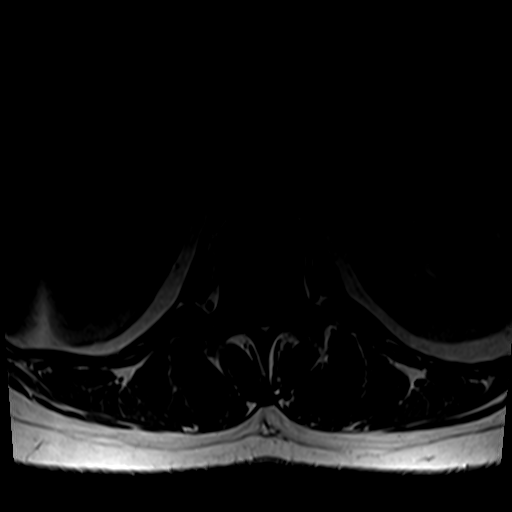

[26 of 48 positions shown; findings below may reference images not displayed]

FINDINGS: Segmentation:  5 lumbar type vertebral bodies.

Alignment:  Normal

Vertebrae: Normal except for some discogenic edema of the endplates
at L4-5 and L5-S1 which could be associated with back pain.

Conus medullaris and cauda equina: Conus extends to the T12-L1
level. Conus and cauda equina appear normal.

Paraspinal and other soft tissues: Normal

Disc levels:

No abnormality at L2-3 or above.

L3-4: Mild desiccation and bulging of the disc. No stenosis or
neural compression.

L4-5: Mild desiccation and bulging of the disc. No stenosis or
neural compression. As noted above, there are some edematous
endplate changes which could be associated with back pain. Mild
facet osteoarthritis.

L5-S1: Mild desiccation and bulging of the disc. Discogenic endplate
edematous changes which could be associated with back pain. No canal
or foraminal stenosis.
IMPRESSION: No neural compressive pathology. Lower lumbar degenerative disc
disease and minimal facet arthritis. Discogenic edema of the
endplates at L4-5 and L5-S1 could be associated with back pain.

## 2019-11-05 ENCOUNTER — Telehealth: Payer: Self-pay

## 2019-11-05 NOTE — Telephone Encounter (Signed)
Copied from [place] [id number]. Topic: General - Other >> Nov 05, 2019 11:17 AM Leward Quan A wrote: Reason for CRM: Patient called to inquire of Fenton Malling does she need to come in for a visit in order to have her FMLA paperwork resubmitted. She states that she was contacted by [employer] about the FMLA papers. Please call patient at Ph# [phone] She states if she does not answer please leave a detailed message.  Return patient's call and she states that her office manager stated they need up to date FMLA paperwork because her paperwork. Patient states that she going to have [employer] to send new forms to the office today, 11/05/2019.

## 2019-11-05 NOTE — Telephone Encounter (Signed)
FMLA forms from [employer] were received via fax. Forms are in Jenni's box. Previous FMLA forms were completed 05/2019 and are in pt's chart. Those forms were dated 11/05/2019 as end date. Tawanna Sat is out of the office this week. Can another provider review and possibly complete forms? Please advise. Thanks TNP

## 2019-11-05 NOTE — Telephone Encounter (Signed)
Let's check to see if she can wait until Ashley Suarez is back in the office on 6/28.  Otherwise, we can print the old set and complete the new form with the same information.

## 2019-11-06 NOTE — Telephone Encounter (Signed)
Left detailed message for patient to call back with information regarding FMLA.

## 2019-11-06 NOTE — Telephone Encounter (Signed)
Patient reports she can wait until jenni come back on 11/12/2019. Patient request a call back when forms are ready.

## 2019-11-14 ENCOUNTER — Encounter: Payer: Self-pay | Admitting: Physician Assistant

## 2019-11-14 NOTE — Telephone Encounter (Signed)
Forms completed and will be faxed tomorrow once I am back in the office

## 2019-11-15 NOTE — Telephone Encounter (Addendum)
FMLA forms have been completed and faxed to [employer]. Fax# [phone]. Pt was advised and a copy of the forms are up front for pt to pick-up. Pt stated she would be by the office to get them later this afternoon. Thanks TNP

## 2019-11-15 NOTE — Telephone Encounter (Signed)
Given to front desk Network engineer) to fax

## 2019-12-26 ENCOUNTER — Ambulatory Visit: Payer: No Typology Code available for payment source | Admitting: Dermatology

## 2020-01-31 ENCOUNTER — Other Ambulatory Visit: Payer: Self-pay | Admitting: Physician Assistant

## 2020-01-31 DIAGNOSIS — M62838 Other muscle spasm: Secondary | ICD-10-CM

## 2020-01-31 NOTE — Telephone Encounter (Signed)
Requested medication (s) are due for refill today: yes  Requested medication (s) are on the active medication list: yes  Last refill:  04/24/2019 #90 1 refill  Future visit scheduled: no  Notes to clinic:  not delegated per protocol     Requested Prescriptions  Pending Prescriptions Disp Refills   baclofen (LIORESAL) 20 MG tablet [Pharmacy Med Name: BACLOFEN 20 MG TABLET 20 Tablet] 30 tablet 0    Sig: TAKE 1 TABLET BY MOUTH 3 TIMES DAILY      Not Delegated - Analgesics:  Muscle Relaxants Failed - 01/31/2020 11:06 AM      Failed - This refill cannot be delegated      Passed - Valid encounter within last 6 months    Recent Outpatient Visits           4 months ago Idiopathic acute pancreatitis, unspecified complication status   [facility] [place], [place], [place]   9 months ago Annual physical exam   [place], [place], [place]   1 year ago Viral upper respiratory illness   [place], [place], [place]   1 year ago Acute pancreatitis, unspecified complication status, unspecified pancreatitis type   [facility], [place]ne Bucy, [place]   1 year ago Cervical radiculopathy   [facility] [place], [place], [place]

## 2020-04-18 ENCOUNTER — Other Ambulatory Visit: Payer: Self-pay

## 2020-04-18 ENCOUNTER — Ambulatory Visit
Admission: RE | Admit: 2020-04-18 | Discharge: 2020-04-18 | Disposition: A | Discharge status: Home / Self Care | Payer: No Typology Code available for payment source | Attending: Physician Assistant | Admitting: Physician Assistant

## 2020-04-18 ENCOUNTER — Ambulatory Visit
Admission: RE | Admit: 2020-04-18 | Discharge: 2020-04-18 | Disposition: A | Discharge status: Home / Self Care | Payer: No Typology Code available for payment source | Source: Ambulatory Visit | Attending: Physician Assistant | Admitting: Physician Assistant

## 2020-04-18 ENCOUNTER — Ambulatory Visit (INDEPENDENT_AMBULATORY_CARE_PROVIDER_SITE_OTHER): Payer: No Typology Code available for payment source | Admitting: Physician Assistant

## 2020-04-18 ENCOUNTER — Encounter: Payer: Self-pay | Admitting: Physician Assistant

## 2020-04-18 ENCOUNTER — Ambulatory Visit: Payer: Self-pay | Admitting: *Deleted

## 2020-04-18 VITALS — BP 140/93 | HR 110 | Temp 98.2°F | Resp 16 | Wt 162.0 lb

## 2020-04-18 DIAGNOSIS — S01111A Laceration without foreign body of right eyelid and periocular area, initial encounter: Secondary | ICD-10-CM

## 2020-04-18 DIAGNOSIS — W19XXXA Unspecified fall, initial encounter: Secondary | ICD-10-CM

## 2020-04-18 DIAGNOSIS — W1789XA Other fall from one level to another, initial encounter: Secondary | ICD-10-CM | POA: Diagnosis not present

## 2020-04-18 DIAGNOSIS — M25511 Pain in right shoulder: Secondary | ICD-10-CM

## 2020-04-18 DIAGNOSIS — R110 Nausea: Secondary | ICD-10-CM

## 2020-04-18 DIAGNOSIS — Z23 Encounter for immunization: Secondary | ICD-10-CM

## 2020-04-18 DIAGNOSIS — S42291A Other displaced fracture of upper end of right humerus, initial encounter for closed fracture: Secondary | ICD-10-CM

## 2020-04-18 MED ORDER — HYDROCODONE-ACETAMINOPHEN 5-325 MG PO TABS: 1.0000 | ORAL_TABLET | Freq: Four times a day (QID) | ORAL | 0 refills | Status: DC | PRN

## 2020-04-18 MED ORDER — ONDANSETRON HCL 4 MG PO TABS: 4.0000 mg | ORAL_TABLET | Freq: Three times a day (TID) | ORAL | 0 refills | Status: DC | PRN
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
## 2020-04-18 NOTE — Telephone Encounter (Signed)
Pt called in c/o right shoulder pain with limited movement after falling off her front porch last night.  No other injuries other than same scratches on her face.  I went over the care advice and made her an appt with Fenton Malling, PA-C for today at 2:40 for an in office visit.   COVID questionnaire completed.  Forwarded my notes to the office [facility].   Reason for Disposition . Can't move injured shoulder normally (e.g., full range of motion, able to touch top of head)  Answer Assessment - Initial Assessment Questions 1. MECHANISM: "How did the injury happen?"     I fell off my front porch last night and injured my right shoulder.  I can't lift it up. 2. ONSET: "When did the injury happen?" (Minutes or hours ago)      Last night 3. APPEARANCE of INJURY: "What does the injury look like?"      No visible injuries. 4. SEVERITY: "Can you move the shoulder normally?"      No   I can't lift it up 5. SIZE: For cuts, bruises, or swelling, ask: "How large is it?" (e.g., inches or centimeters;  entire joint)      No breaks in skin or bruising 6. PAIN: "Is there pain?" If Yes, ask: "How bad is the pain?"   (e.g., Scale 1-10; or mild, moderate, severe)     5-6 on pain scale     If keep it at my side a 3. 7. TETANUS: For any breaks in the skin, ask: "When was the last tetanus booster?"     I think I'm due in Jan. 8. OTHER SYMPTOMS: "Do you have any other symptoms?" (e.g., loss of sensation)     Scratches on my face. 9. PREGNANCY: "Is there any chance you are pregnant?" "When was your last menstrual period?"     No  Have an IUD  Protocols used: SHOULDER INJURY-A-AH

## 2020-04-21 ENCOUNTER — Encounter: Payer: Self-pay | Admitting: Physician Assistant

## 2020-04-21 ENCOUNTER — Other Ambulatory Visit: Payer: Self-pay | Admitting: Physician Assistant

## 2020-04-21 NOTE — Patient Instructions (Signed)
Humerus Fracture Treated With Immobilization  The humerus is the large bone in the upper arm. A broken (fractured) humerus is often treated by wearing a cast, splint, or sling (immobilization). This holds the broken pieces in place so they can heal. What are the causes? This condition may be caused by:  A fall.  A hard, direct hit to the arm.  A car accident. What increases the risk? You are more likely to develop this condition if:  You are elderly.  You have a disease that makes the bones thin and weak. What are the signs or symptoms?  Pain.  Swelling.  Bruising.  Not being able to move your arm normally. How is this treated? Treatment involves wearing a cast, splint, or sling until your arm heals enough for you to begin range-of-motion exercises. You may also be prescribed pain medicine. Follow these instructions at home: If you have a cast:  Do not stick anything inside the cast to scratch your skin.  Check the skin around the cast every day. Tell your doctor if you have any concerns.  You may put lotion on dry skin around the edges of the cast. Do not put lotion on the skin under the cast.  Keep the cast clean and dry. If you have a splint or sling:  Wear the splint or sling as told by your doctor. Remove it only as told by your doctor.  Loosen the splint or sling if your fingers: ? Tingle. ? Become numb. ? Turn cold and blue.  Keep the splint or sling clean and dry. Bathing  Do not take baths, swim, or use a hot tub until your doctor says that you can. Ask your doctor if you may take showers. You may only be allowed to take sponge baths.  If your cast, splint, or sling is not waterproof: ? Do not let it get wet. ? Cover it with a watertight covering when you take a bath or shower.  If you have a sling, remove it for bathing only if your doctor says this is okay. Managing pain, stiffness, and swelling   If told, put ice on the injured area. ? If you  have a removable splint or sling, remove it as told by your doctor. ? Put ice in a plastic bag. ? Place a towel between your skin and the bag or between your cast and the bag. ? Leave the ice on for 20 minutes, 2-3 times a day.  Move your fingers often.  Raise (elevate) the injured area above the level of your heart while you are sitting or lying down. Driving  Do not drive or use heavy machinery while taking prescription pain medicine.  Do not drive while wearing a cast, splint, or sling on an arm that you use for driving. Activity  Return to your normal activities as told by your doctor. Ask your doctor what activities are safe for you.  Do not lift anything until your doctor says that it is safe.  Do range-of-motion exercises only as told by your doctor. General instructions  Do not put pressure on any part of the cast or splint until it is fully hardened. This may take many hours.  Do not use any products that contain nicotine or tobacco, such as cigarettes, e-cigarettes, and chewing tobacco. These can delay bone healing. If you need help quitting, ask your doctor.  Take over-the-counter and prescription medicines only as told by your doctor.  Ask your doctor if the medicine   you are taking can cause trouble pooping (constipation). You may need to take steps to prevent or treat trouble pooping: ? Drink enough fluid to keep your pee (urine) pale yellow. ? Take over-the-counter or prescription medicines. ? Eat foods that are high in fiber. These include beans, whole grains, and fresh fruits and vegetables. ? Limit foods that are high in fat and sugar. These include fried or sweet foods.  Keep all follow-up visits as told by your doctor. This is important. Contact a doctor if:  You have any new pain, swelling, or bruising.  Your pain, swelling, and bruising do not get better.  Your cast, splint, or sling becomes loose or damaged. Get help right away if:  Your skin or  fingers on your injured arm turn blue or gray.  Your arm is cold or numb.  You have very bad pain in your injured arm. Summary  The humerus is the large bone in the upper arm.  A broken humerus is often treated by wearing a cast, splint, or sling.  Wear a splint or sling as told by your doctor. Remove it only as told by your doctor.  Move your fingers often. This information is not intended to replace advice given to you by your health care provider. Make sure you discuss any questions you have with your health care provider. Document Revised: 01/02/2018 Document Reviewed: 01/02/2018 Elsevier Patient Education  2020 Elsevier Inc.  

## 2020-04-25 ENCOUNTER — Other Ambulatory Visit: Payer: Self-pay | Admitting: Physician Assistant

## 2020-04-25 ENCOUNTER — Ambulatory Visit (INDEPENDENT_AMBULATORY_CARE_PROVIDER_SITE_OTHER): Payer: No Typology Code available for payment source | Admitting: Physician Assistant

## 2020-04-25 ENCOUNTER — Encounter: Payer: Self-pay | Admitting: Physician Assistant

## 2020-04-25 ENCOUNTER — Other Ambulatory Visit: Payer: Self-pay

## 2020-04-25 VITALS — BP 145/92 | HR 86 | Temp 98.7°F | Resp 16 | Ht 66.0 in | Wt 167.6 lb

## 2020-04-25 DIAGNOSIS — F3176 Bipolar disorder, in full remission, most recent episode depressed: Secondary | ICD-10-CM | POA: Diagnosis not present

## 2020-04-25 DIAGNOSIS — E559 Vitamin D deficiency, unspecified: Secondary | ICD-10-CM

## 2020-04-25 DIAGNOSIS — J301 Allergic rhinitis due to pollen: Secondary | ICD-10-CM

## 2020-04-25 DIAGNOSIS — S42424D Nondisplaced comminuted supracondylar fracture without intercondylar fracture of right humerus, subsequent encounter for fracture with routine healing: Secondary | ICD-10-CM

## 2020-04-25 DIAGNOSIS — Z716 Tobacco abuse counseling: Secondary | ICD-10-CM

## 2020-04-25 DIAGNOSIS — Z0000 Encounter for general adult medical examination without abnormal findings: Secondary | ICD-10-CM | POA: Diagnosis not present

## 2020-04-25 DIAGNOSIS — Z6827 Body mass index (BMI) 27.0-27.9, adult: Secondary | ICD-10-CM

## 2020-04-25 DIAGNOSIS — R110 Nausea: Secondary | ICD-10-CM

## 2020-04-25 MED ORDER — OXYCODONE-ACETAMINOPHEN 5-325 MG PO TABS: 1.0000 | ORAL_TABLET | ORAL | 0 refills | Status: DC | PRN

## 2020-04-25 MED ORDER — FLUTICASONE PROPIONATE 50 MCG/ACT NA SUSP: 2.0000 | Freq: Every day | NASAL | 1 refills | Status: DC

## 2020-04-25 MED ORDER — CHANTIX STARTING MONTH PAK 0.5 MG X 11 & 1 MG X 42 PO TABS: ORAL_TABLET | ORAL | 0 refills | Status: DC

## 2020-04-25 MED ORDER — ONDANSETRON HCL 4 MG PO TABS: 4.0000 mg | ORAL_TABLET | Freq: Three times a day (TID) | ORAL | 0 refills | Status: DC | PRN

## 2020-04-25 NOTE — Patient Instructions (Signed)
Preventive Care 21-36 Years Old, Female Preventive care refers to visits with your health care provider and lifestyle choices that can promote health and wellness. This includes:  A yearly physical exam. This may also be called an annual well check.  Regular dental visits and eye exams.  Immunizations.  Screening for certain conditions.  Healthy lifestyle choices, such as eating a healthy diet, getting regular exercise, not using drugs or products that contain nicotine and tobacco, and limiting alcohol use. What can I expect for my preventive care visit? Physical exam Your health care provider will check your:  Height and weight. This may be used to calculate body mass index (BMI), which tells if you are at a healthy weight.  Heart rate and blood pressure.  Skin for abnormal spots. Counseling Your health care provider may ask you questions about your:  Alcohol, tobacco, and drug use.  Emotional well-being.  Home and relationship well-being.  Sexual activity.  Eating habits.  Work and work environment.  Method of birth control.  Menstrual cycle.  Pregnancy history. What immunizations do I need?  Influenza (flu) vaccine  This is recommended every year. Tetanus, diphtheria, and pertussis (Tdap) vaccine  You may need a Td booster every 10 years. Varicella (chickenpox) vaccine  You may need this if you have not been vaccinated. Human papillomavirus (HPV) vaccine  If recommended by your health care provider, you may need three doses over 6 months. Measles, mumps, and rubella (MMR) vaccine  You may need at least one dose of MMR. You may also need a second dose. Meningococcal conjugate (MenACWY) vaccine  One dose is recommended if you are age 19-21 years and a first-year college student living in a residence hall, or if you have one of several medical conditions. You may also need additional booster doses. Pneumococcal conjugate (PCV13) vaccine  You may need  this if you have certain conditions and were not previously vaccinated. Pneumococcal polysaccharide (PPSV23) vaccine  You may need one or two doses if you smoke cigarettes or if you have certain conditions. Hepatitis A vaccine  You may need this if you have certain conditions or if you travel or work in places where you may be exposed to hepatitis A. Hepatitis B vaccine  You may need this if you have certain conditions or if you travel or work in places where you may be exposed to hepatitis B. Haemophilus influenzae type b (Hib) vaccine  You may need this if you have certain conditions. You may receive vaccines as individual doses or as more than one vaccine together in one shot (combination vaccines). Talk with your health care provider about the risks and benefits of combination vaccines. What tests do I need?  Blood tests  Lipid and cholesterol levels. These may be checked every 5 years starting at age 20.  Hepatitis C test.  Hepatitis B test. Screening  Diabetes screening. This is done by checking your blood sugar (glucose) after you have not eaten for a while (fasting).  Sexually transmitted disease (STD) testing.  BRCA-related cancer screening. This may be done if you have a family history of breast, ovarian, tubal, or peritoneal cancers.  Pelvic exam and Pap test. This may be done every 3 years starting at age 21. Starting at age 30, this may be done every 5 years if you have a Pap test in combination with an HPV test. Talk with your health care provider about your test results, treatment options, and if necessary, the need for more tests.   Follow these instructions at home: Eating and drinking   Eat a diet that includes fresh fruits and vegetables, whole grains, lean protein, and low-fat dairy.  Take vitamin and mineral supplements as recommended by your health care provider.  Do not drink alcohol if: ? Your health care provider tells you not to drink. ? You are  pregnant, may be pregnant, or are planning to become pregnant.  If you drink alcohol: ? Limit how much you have to 0-1 drink a day. ? Be aware of how much alcohol is in your drink. In the [place] one drink equals one 12 oz bottle of beer (355 mL), one 5 oz glass of wine (148 mL), or one 1 oz glass of hard liquor (44 mL). Lifestyle  Take daily care of your teeth and gums.  Stay active. Exercise for at least 30 minutes on 5 or more days each week.  Do not use any products that contain nicotine or tobacco, such as cigarettes, e-cigarettes, and chewing tobacco. If you need help quitting, ask your health care provider.  If you are sexually active, practice safe sex. Use a condom or other form of birth control (contraception) in order to prevent pregnancy and STIs (sexually transmitted infections). If you plan to become pregnant, see your health care provider for a preconception visit. What's next?  Visit your health care provider once a year for a well check visit.  Ask your health care provider how often you should have your eyes and teeth checked.  Stay up to date on all vaccines. This information is not intended to replace advice given to you by your health care provider. Make sure you discuss any questions you have with your health care provider. Document Revised: 01/12/2018 Document Reviewed: 01/12/2018 Elsevier Patient Education  2020 [employer].
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
## 2020-04-28 ENCOUNTER — Other Ambulatory Visit: Payer: No Typology Code available for payment source

## 2020-04-28 ENCOUNTER — Other Ambulatory Visit: Payer: Self-pay

## 2020-04-29 ENCOUNTER — Other Ambulatory Visit: Payer: Self-pay | Admitting: Physician Assistant

## 2020-04-29 ENCOUNTER — Encounter: Payer: Self-pay | Admitting: Physician Assistant

## 2020-04-29 DIAGNOSIS — E559 Vitamin D deficiency, unspecified: Secondary | ICD-10-CM | POA: Insufficient documentation

## 2020-04-29 LAB — COMPREHENSIVE METABOLIC PANEL
ALT: 31 IU/L (ref 0–32)
AST: 26 IU/L (ref 0–40)
Albumin/Globulin Ratio: 2 (ref 1.2–2.2)
Albumin: 4.5 g/dL (ref 3.8–4.8)
Alkaline Phosphatase: 62 IU/L (ref 44–121)
BUN/Creatinine Ratio: 9 (ref 9–23)
BUN: 9 mg/dL (ref 6–20)
Bilirubin Total: 0.4 mg/dL (ref 0.0–1.2)
CO2: 22 mmol/L (ref 20–29)
Calcium: 10.1 mg/dL (ref 8.7–10.2)
Chloride: 99 mmol/L (ref 96–106)
Creatinine, Ser: 1.05 mg/dL — ABNORMAL HIGH (ref 0.57–1.00)
GFR calc Af Amer: 79 mL/min/{1.73_m2} (ref >59)
GFR calc non Af Amer: 68 mL/min/{1.73_m2} (ref >59)
Globulin, Total: 2.3 g/dL (ref 1.5–4.5)
Glucose: 97 mg/dL (ref 65–99)
Potassium: 4.8 mmol/L (ref 3.5–5.2)
Sodium: 137 mmol/L (ref 134–144)
Total Protein: 6.8 g/dL (ref 6.0–8.5)

## 2020-04-29 LAB — CBC WITH DIFFERENTIAL/PLATELET
Basophils Absolute: 0 10*3/uL (ref 0.0–0.2)
Basos: 0 %
EOS (ABSOLUTE): 0.1 10*3/uL (ref 0.0–0.4)
Eos: 1 %
Hematocrit: 43.3 % (ref 34.0–46.6)
Hemoglobin: 14.9 g/dL (ref 11.1–15.9)
Immature Grans (Abs): 0 10*3/uL (ref 0.0–0.1)
Immature Granulocytes: 0 %
Lymphocytes Absolute: 1.7 10*3/uL (ref 0.7–3.1)
Lymphs: 18 %
MCH: 34.6 pg — ABNORMAL HIGH (ref 26.6–33.0)
MCHC: 34.4 g/dL (ref 31.5–35.7)
MCV: 101 fL — ABNORMAL HIGH (ref 79–97)
Monocytes Absolute: 0.5 10*3/uL (ref 0.1–0.9)
Monocytes: 5 %
Neutrophils Absolute: 7 10*3/uL (ref 1.4–7.0)
Neutrophils: 76 %
Platelets: 312 10*3/uL (ref 150–450)
RBC: 4.31 x10E6/uL (ref 3.77–5.28)
RDW: 11.7 % (ref 11.7–15.4)
WBC: 9.3 10*3/uL (ref 3.4–10.8)

## 2020-04-29 LAB — TSH: TSH: 3.17 u[IU]/mL (ref 0.450–4.500)

## 2020-04-29 LAB — LIPID PANEL
Chol/HDL Ratio: 3.4 ratio (ref 0.0–4.4)
Cholesterol, Total: 181 mg/dL (ref 100–199)
HDL: 54 mg/dL (ref >39)
LDL Chol Calc (NIH): 98 mg/dL (ref 0–99)
Triglycerides: 165 mg/dL — ABNORMAL HIGH (ref 0–149)
VLDL Cholesterol Cal: 29 mg/dL (ref 5–40)

## 2020-04-29 LAB — HEMOGLOBIN A1C
Est. average glucose Bld gHb Est-mCnc: 100 mg/dL
Hgb A1c MFr Bld: 5.1 % (ref 4.8–5.6)

## 2020-04-29 LAB — VITAMIN D 25 HYDROXY (VIT D DEFICIENCY, FRACTURES): Vit D, 25-Hydroxy: 19.7 ng/mL — ABNORMAL LOW (ref 30.0–100.0)

## 2020-04-29 MED ORDER — VITAMIN D 25 MCG (1000 UNIT) PO TABS
1000.0000 [IU] | ORAL_TABLET | Freq: Every day | ORAL | 1 refills | Status: DC
Start: 2020-04-29 — End: 2021-04-28

## 2020-04-29 MED ORDER — VITAMIN D (ERGOCALCIFEROL) 1.25 MG (50000 UNIT) PO CAPS: 50000.0000 [IU] | ORAL_CAPSULE | ORAL | 1 refills | Status: DC

## 2020-04-29 NOTE — Progress Notes (Signed)
Vit d sent in

## 2020-05-01 ENCOUNTER — Other Ambulatory Visit: Payer: Self-pay | Admitting: Physician Assistant

## 2020-05-01 ENCOUNTER — Telehealth: Payer: Self-pay

## 2020-05-01 DIAGNOSIS — S42424D Nondisplaced comminuted supracondylar fracture without intercondylar fracture of right humerus, subsequent encounter for fracture with routine healing: Secondary | ICD-10-CM

## 2020-05-01 NOTE — Telephone Encounter (Signed)
Copied from [place] [id number]. Topic: General - Inquiry >> May 01, 2020  1:16 PM Lennox Solders wrote: Reason for CRM: Pt is unable to access her mychart account and would like to know the status of her FMLA paperwork

## 2020-05-02 ENCOUNTER — Encounter: Payer: Self-pay | Admitting: Physician Assistant

## 2020-05-02 ENCOUNTER — Other Ambulatory Visit: Payer: Self-pay | Admitting: Physician Assistant

## 2020-05-02 MED ORDER — OXYCODONE-ACETAMINOPHEN 5-325 MG PO TABS: 1.0000 | ORAL_TABLET | ORAL | 0 refills | Status: DC | PRN

## 2020-05-02 NOTE — Telephone Encounter (Signed)
It has been completed and is ready.

## 2020-05-02 NOTE — Telephone Encounter (Signed)
LM that paper work is ready.

## 2020-05-10 ENCOUNTER — Other Ambulatory Visit: Payer: Self-pay | Admitting: Physician Assistant

## 2020-05-10 DIAGNOSIS — R110 Nausea: Secondary | ICD-10-CM

## 2020-05-10 DIAGNOSIS — S42424D Nondisplaced comminuted supracondylar fracture without intercondylar fracture of right humerus, subsequent encounter for fracture with routine healing: Secondary | ICD-10-CM

## 2020-05-12 MED ORDER — ONDANSETRON HCL 4 MG PO TABS: 4.0000 mg | ORAL_TABLET | Freq: Three times a day (TID) | ORAL | 0 refills | Status: DC | PRN

## 2020-05-12 MED ORDER — OXYCODONE-ACETAMINOPHEN 5-325 MG PO TABS: 1.0000 | ORAL_TABLET | ORAL | 0 refills | Status: DC | PRN

## 2020-05-20 ENCOUNTER — Encounter: Payer: Self-pay | Admitting: Physician Assistant

## 2020-05-20 DIAGNOSIS — S42424D Nondisplaced comminuted supracondylar fracture without intercondylar fracture of right humerus, subsequent encounter for fracture with routine healing: Secondary | ICD-10-CM

## 2020-05-21 DIAGNOSIS — S42424D Nondisplaced comminuted supracondylar fracture without intercondylar fracture of right humerus, subsequent encounter for fracture with routine healing: Secondary | ICD-10-CM | POA: Insufficient documentation

## 2020-05-21 NOTE — Addendum Note (Signed)
Addended by: Margaretann Loveless on: 05/21/2020 03:36 PM   Modules accepted: Orders

## 2020-06-03 ENCOUNTER — Ambulatory Visit: Payer: No Typology Code available for payment source | Admitting: Obstetrics and Gynecology

## 2020-06-04 ENCOUNTER — Encounter: Payer: Self-pay | Admitting: Obstetrics and Gynecology

## 2020-06-04 ENCOUNTER — Other Ambulatory Visit: Payer: Self-pay

## 2020-06-04 ENCOUNTER — Ambulatory Visit (INDEPENDENT_AMBULATORY_CARE_PROVIDER_SITE_OTHER): Payer: No Typology Code available for payment source | Admitting: Obstetrics and Gynecology

## 2020-06-04 VITALS — BP 128/80 | Ht 66.0 in | Wt 171.0 lb

## 2020-06-04 DIAGNOSIS — Z1331 Encounter for screening for depression: Secondary | ICD-10-CM | POA: Diagnosis not present

## 2020-06-04 DIAGNOSIS — Z01419 Encounter for gynecological examination (general) (routine) without abnormal findings: Secondary | ICD-10-CM | POA: Diagnosis not present

## 2020-06-04 DIAGNOSIS — Z1339 Encounter for screening examination for other mental health and behavioral disorders: Secondary | ICD-10-CM | POA: Diagnosis not present

## 2020-06-04 NOTE — Patient Instructions (Signed)
High Triglycerides Eating Plan  Triglycerides are a type of fat in the blood. High levels of triglycerides can increase your risk of heart disease and stroke. If your triglyceride levels are high, choosing the right foods can help lower your triglycerides and keep your heart healthy. Work with your health care provider or a diet and nutrition specialist (dietitian) to develop an eating plan that is right for you.  What are tips for following this plan?  General guidelines    · Lose weight, if you are overweight. For most people, losing 5-10 lbs (2-5 kg) helps lower triglyceride levels. A weight-loss plan may include.  ? 30 minutes of exercise at least 5 days a week.  ? Reducing the amount of calories, sugar, and fat you eat.  · Eat a wide variety of fresh fruits, vegetables, and whole grains. These foods are high in fiber.  · Eat foods that contain healthy fats, such as fatty fish, nuts, seeds, and olive oil.  · Avoid foods that are high in added sugar, added salt (sodium), saturated fat, and trans fat.  · Avoid low-fiber, refined carbohydrates such as white bread, crackers, noodles, and white rice.  · Avoid foods with partially hydrogenated oils (trans fats), such as fried foods or stick margarine.  · Limit alcohol intake to no more than 1 drink a day for nonpregnant women and 2 drinks a day for men. One drink equals 12 oz of beer, 5 oz of wine, or 1½ oz of hard liquor. Your health care provider may recommend that you drink less depending on your overall health.  Reading food labels  · Check food labels for the amount of saturated fat. Choose foods with no or very little saturated fat.  · Check food labels for the amount of trans fat. Choose foods with no trans fat.  · Check food labels for the amount of cholesterol. Choose foods low in cholesterol. Ask your dietitian how much cholesterol you should have each day.  · Check food labels for the amount of sodium. Choose foods with less than 140 milligrams (mg) per  serving.  Shopping  · Buy dairy products labeled as nonfat (skim) or low-fat (1%).  · Avoid buying processed or prepackaged foods. These are often high in added sugar, sodium, and fat.  Cooking  · Choose healthy fats when cooking, such as olive oil or canola oil.  · Cook foods using lower fat methods, such as baking, broiling, boiling, or grilling.  · Make your own sauces, dressings, and marinades when possible, instead of buying them. Store-bought sauces, dressings, and marinades are often high in sodium and sugar.  Meal planning  · Eat more home-cooked food and less restaurant, buffet, and fast food.  · Eat fatty fish at least 2 times each week. Examples of fatty fish include salmon, trout, mackerel, tuna, and herring.  · If you eat whole eggs, do not eat more than 3 egg yolks per week.  What foods are recommended?  The items listed may not be a complete list. Talk with your dietitian about what dietary choices are best for you.  Grains  Whole wheat or whole grain breads, crackers, cereals, and pasta. Unsweetened oatmeal. Bulgur. Barley. Quinoa. Brown rice. Whole wheat flour tortillas.  Vegetables  Fresh or frozen vegetables. Low-sodium canned vegetables.  Fruits  All fresh, canned (in natural juice), or frozen fruits.  Meats and other protein foods  Skinless chicken or turkey. Ground chicken or turkey. Lean cuts of pork, trimmed of   fat. Fish and seafood, especially salmon, trout, and herring. Egg whites. Dried beans, peas, or lentils. Unsalted nuts or seeds. Unsalted canned beans. Natural peanut or almond butter.  Dairy  Low-fat dairy products. Skim or low-fat (1%) milk. Reduced fat (2%) and low-sodium cheese. Low-fat ricotta cheese. Low-fat cottage cheese. Plain, low-fat yogurt.  Fats and oils  Tub margarine without trans fats. Light or reduced-fat mayonnaise. Light or reduced-fat salad dressings. Avocado. Safflower, olive, sunflower, soybean, and canola oils.  What foods are not recommended?  The items listed  may not be a complete list. Talk with your dietitian about what dietary choices are best for you.  Grains  White bread. White (regular) pasta. White rice. Cornbread. Bagels. Pastries. Crackers that contain trans fat.  Vegetables  Creamed or fried vegetables. Vegetables in a cheese sauce.  Fruits  Sweetened dried fruit. Canned fruit in syrup. Fruit juice.  Meats and other protein foods  Fatty cuts of meat. Ribs. Chicken wings. Bacon. Sausage. Bologna. Salami. Chitterlings. Fatback. Hot dogs. Bratwurst. Packaged lunch meats.  Dairy  Whole or reduced-fat (2%) milk. Half-and-half. Cream cheese. Full-fat or sweetened yogurt. Full-fat cheese. Nondairy creamers. Whipped toppings. Processed cheese or cheese spreads. Cheese curds.  Beverages  Alcohol. Sweetened drinks, such as soda, lemonade, fruit drinks, or punches.  Fats and oils  Butter. Stick margarine. Lard. Shortening. Ghee. Bacon fat. Tropical oils, such as coconut, palm kernel, or palm oils.  Sweets and desserts  Corn syrup. Sugars. Honey. Molasses. Candy. Jam and jelly. Syrup. Sweetened cereals. Cookies. Pies. Cakes. Donuts. Muffins. Ice cream.  Condiments  Store-bought sauces, dressings, and marinades that are high in sugar, such as ketchup and barbecue sauce.  Summary  · High levels of triglycerides can increase the risk of heart disease and stroke. Choosing the right foods can help lower your triglycerides.  · Eat plenty of fresh fruits, vegetables, and whole grains. Choose low-fat dairy and lean meats. Eat fatty fish at least twice a week.  · Avoid processed and prepackaged foods with added sugar, sodium, saturated fat, and trans fat.  · If you need suggestions or have questions about what types of food are good for you, talk with your health care provider or a dietitian.  This information is not intended to replace advice given to you by your health care provider. Make sure you discuss any questions you have with your health care provider.  Document Revised:  09/05/2019 Document Reviewed: 09/05/2019  Elsevier Patient Education © 2021 [employer]
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
## 2020-06-18 NOTE — Telephone Encounter (Signed)
Mirena rcvd/charged 06/21/2018

## 2020-06-24 ENCOUNTER — Encounter: Payer: Self-pay | Admitting: Physical Therapy

## 2020-06-24 ENCOUNTER — Ambulatory Visit: Payer: No Typology Code available for payment source | Attending: Physician Assistant | Admitting: Physical Therapy

## 2020-06-24 ENCOUNTER — Other Ambulatory Visit: Payer: Self-pay

## 2020-06-24 DIAGNOSIS — M25621 Stiffness of right elbow, not elsewhere classified: Secondary | ICD-10-CM | POA: Insufficient documentation

## 2020-06-24 DIAGNOSIS — M79621 Pain in right upper arm: Secondary | ICD-10-CM | POA: Insufficient documentation

## 2020-06-24 DIAGNOSIS — M6281 Muscle weakness (generalized): Secondary | ICD-10-CM | POA: Insufficient documentation

## 2020-06-24 DIAGNOSIS — M25611 Stiffness of right shoulder, not elsewhere classified: Secondary | ICD-10-CM | POA: Insufficient documentation
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
## 2020-07-01 ENCOUNTER — Other Ambulatory Visit: Payer: Self-pay

## 2020-07-01 ENCOUNTER — Ambulatory Visit: Payer: No Typology Code available for payment source | Admitting: Physical Therapy

## 2020-07-01 ENCOUNTER — Encounter: Payer: Self-pay | Admitting: Physical Therapy

## 2020-07-01 DIAGNOSIS — M6281 Muscle weakness (generalized): Secondary | ICD-10-CM

## 2020-07-01 DIAGNOSIS — M79621 Pain in right upper arm: Secondary | ICD-10-CM

## 2020-07-01 DIAGNOSIS — M25611 Stiffness of right shoulder, not elsewhere classified: Secondary | ICD-10-CM

## 2020-07-01 DIAGNOSIS — M25621 Stiffness of right elbow, not elsewhere classified: Secondary | ICD-10-CM
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
## 2020-07-03 ENCOUNTER — Ambulatory Visit: Payer: No Typology Code available for payment source

## 2020-07-03 ENCOUNTER — Encounter: Payer: Self-pay | Admitting: Physical Therapy

## 2020-07-03 ENCOUNTER — Other Ambulatory Visit: Payer: Self-pay

## 2020-07-03 DIAGNOSIS — M25621 Stiffness of right elbow, not elsewhere classified: Secondary | ICD-10-CM

## 2020-07-03 DIAGNOSIS — M79621 Pain in right upper arm: Secondary | ICD-10-CM | POA: Diagnosis not present

## 2020-07-03 DIAGNOSIS — M6281 Muscle weakness (generalized): Secondary | ICD-10-CM

## 2020-07-03 DIAGNOSIS — M25611 Stiffness of right shoulder, not elsewhere classified: Secondary | ICD-10-CM
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
## 2020-07-07 ENCOUNTER — Encounter: Payer: Self-pay | Admitting: Obstetrics and Gynecology

## 2020-07-07 ENCOUNTER — Other Ambulatory Visit: Payer: Self-pay

## 2020-07-07 ENCOUNTER — Ambulatory Visit (INDEPENDENT_AMBULATORY_CARE_PROVIDER_SITE_OTHER): Payer: No Typology Code available for payment source | Admitting: Obstetrics and Gynecology

## 2020-07-07 ENCOUNTER — Other Ambulatory Visit: Payer: Self-pay | Admitting: Obstetrics and Gynecology

## 2020-07-07 DIAGNOSIS — F419 Anxiety disorder, unspecified: Secondary | ICD-10-CM

## 2020-07-07 MED ORDER — HYDROXYZINE HCL 25 MG PO TABS: 25.0000 mg | ORAL_TABLET | Freq: Four times a day (QID) | ORAL | 2 refills | Status: DC | PRN

## 2020-07-07 MED ORDER — BUPROPION HCL ER (XL) 150 MG PO TB24: 150.0000 mg | ORAL_TABLET | Freq: Every day | ORAL | 1 refills | Status: DC
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
## 2020-07-08 ENCOUNTER — Encounter: Payer: No Typology Code available for payment source | Admitting: Physical Therapy

## 2020-07-10 ENCOUNTER — Telehealth: Payer: Self-pay | Admitting: Physical Therapy

## 2020-07-10 ENCOUNTER — Other Ambulatory Visit: Payer: Self-pay

## 2020-07-10 ENCOUNTER — Encounter: Payer: Self-pay | Admitting: Physical Therapy

## 2020-07-10 ENCOUNTER — Ambulatory Visit: Payer: No Typology Code available for payment source | Admitting: Physical Therapy

## 2020-07-10 DIAGNOSIS — M79621 Pain in right upper arm: Secondary | ICD-10-CM

## 2020-07-10 DIAGNOSIS — M25621 Stiffness of right elbow, not elsewhere classified: Secondary | ICD-10-CM

## 2020-07-10 DIAGNOSIS — M6281 Muscle weakness (generalized): Secondary | ICD-10-CM

## 2020-07-10 DIAGNOSIS — M25611 Stiffness of right shoulder, not elsewhere classified: Secondary | ICD-10-CM

## 2020-07-10 NOTE — Telephone Encounter (Signed)
Called Dr. Harden Mo office to inquire if she can start jogging again and if he has a specific rehab protocol he would like used. Spoke with [place] who said she would pass the messages on to Dr. Mack Guise and they will call back patient about jogging and PT about protocol.   Ashley Suarez. Graylon Good, PT, DPT 07/10/20, 3:01 PM
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
## 2020-07-15 ENCOUNTER — Encounter: Payer: Self-pay | Admitting: Physical Therapy

## 2020-07-15 ENCOUNTER — Ambulatory Visit: Payer: No Typology Code available for payment source | Attending: Physician Assistant | Admitting: Physical Therapy

## 2020-07-15 ENCOUNTER — Other Ambulatory Visit: Payer: Self-pay

## 2020-07-15 DIAGNOSIS — M79621 Pain in right upper arm: Secondary | ICD-10-CM

## 2020-07-15 DIAGNOSIS — M25621 Stiffness of right elbow, not elsewhere classified: Secondary | ICD-10-CM

## 2020-07-15 DIAGNOSIS — M6281 Muscle weakness (generalized): Secondary | ICD-10-CM | POA: Diagnosis present

## 2020-07-15 DIAGNOSIS — M25611 Stiffness of right shoulder, not elsewhere classified: Secondary | ICD-10-CM | POA: Diagnosis present
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
## 2020-07-17 ENCOUNTER — Ambulatory Visit: Payer: No Typology Code available for payment source | Admitting: Physical Therapy

## 2020-07-17 ENCOUNTER — Encounter: Payer: Self-pay | Admitting: Physical Therapy

## 2020-07-17 ENCOUNTER — Other Ambulatory Visit: Payer: Self-pay

## 2020-07-17 DIAGNOSIS — M79621 Pain in right upper arm: Secondary | ICD-10-CM | POA: Diagnosis not present

## 2020-07-17 DIAGNOSIS — M25621 Stiffness of right elbow, not elsewhere classified: Secondary | ICD-10-CM

## 2020-07-17 DIAGNOSIS — M25611 Stiffness of right shoulder, not elsewhere classified: Secondary | ICD-10-CM

## 2020-07-17 DIAGNOSIS — M6281 Muscle weakness (generalized): Secondary | ICD-10-CM
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
## 2020-07-22 ENCOUNTER — Ambulatory Visit: Payer: No Typology Code available for payment source | Admitting: Physical Therapy

## 2020-07-24 ENCOUNTER — Encounter: Payer: No Typology Code available for payment source | Admitting: Physical Therapy

## 2020-07-29 ENCOUNTER — Ambulatory Visit: Payer: No Typology Code available for payment source | Admitting: Physical Therapy

## 2020-07-31 ENCOUNTER — Encounter: Payer: No Typology Code available for payment source | Admitting: Physical Therapy

## 2020-08-01 ENCOUNTER — Other Ambulatory Visit: Payer: Self-pay | Admitting: Obstetrics and Gynecology

## 2020-08-01 DIAGNOSIS — F419 Anxiety disorder, unspecified: Secondary | ICD-10-CM

## 2020-08-01 MED ORDER — BUPROPION HCL ER (XL) 300 MG PO TB24: 300.0000 mg | ORAL_TABLET | Freq: Every day | ORAL | 1 refills | Status: DC

## 2020-08-04 ENCOUNTER — Encounter: Payer: Self-pay | Admitting: Physical Therapy

## 2020-08-05 ENCOUNTER — Ambulatory Visit: Payer: No Typology Code available for payment source | Admitting: Physical Therapy

## 2020-08-07 ENCOUNTER — Encounter: Payer: No Typology Code available for payment source | Admitting: Physical Therapy

## 2020-08-12 ENCOUNTER — Encounter: Payer: No Typology Code available for payment source | Admitting: Physical Therapy

## 2020-08-14 ENCOUNTER — Encounter: Payer: No Typology Code available for payment source | Admitting: Physical Therapy

## 2020-08-19 ENCOUNTER — Encounter: Payer: No Typology Code available for payment source | Admitting: Physical Therapy

## 2020-08-21 ENCOUNTER — Encounter: Payer: No Typology Code available for payment source | Admitting: Physical Therapy

## 2020-08-26 ENCOUNTER — Encounter: Payer: No Typology Code available for payment source | Admitting: Physical Therapy

## 2020-08-28 ENCOUNTER — Encounter: Payer: No Typology Code available for payment source | Admitting: Physical Therapy

## 2020-09-02 ENCOUNTER — Encounter: Payer: No Typology Code available for payment source | Admitting: Physical Therapy

## 2020-09-04 ENCOUNTER — Encounter: Payer: No Typology Code available for payment source | Admitting: Physical Therapy

## 2020-09-09 ENCOUNTER — Encounter: Payer: No Typology Code available for payment source | Admitting: Physical Therapy

## 2020-09-11 ENCOUNTER — Encounter: Payer: No Typology Code available for payment source | Admitting: Physical Therapy

## 2020-09-18 ENCOUNTER — Other Ambulatory Visit: Payer: Self-pay

## 2020-09-18 MED FILL — Bupropion HCl Tab ER 24HR 300 MG: ORAL | 30 days supply | Qty: 30 | Fill #0 | Status: AC

## 2020-09-24 ENCOUNTER — Other Ambulatory Visit: Payer: Self-pay

## 2020-09-24 ENCOUNTER — Other Ambulatory Visit: Payer: Self-pay | Admitting: Obstetrics and Gynecology

## 2020-09-24 DIAGNOSIS — R110 Nausea: Secondary | ICD-10-CM

## 2020-09-24 MED ORDER — ONDANSETRON HCL 4 MG PO TABS
4.0000 mg | ORAL_TABLET | Freq: Three times a day (TID) | ORAL | 1 refills | Status: DC | PRN
  Filled 2020-09-24: qty 20, 7d supply, fill #0

## 2020-09-30 ENCOUNTER — Other Ambulatory Visit: Payer: Self-pay

## 2020-09-30 ENCOUNTER — Other Ambulatory Visit: Payer: Self-pay | Admitting: Obstetrics and Gynecology

## 2020-09-30 DIAGNOSIS — F419 Anxiety disorder, unspecified: Secondary | ICD-10-CM

## 2020-09-30 MED ORDER — BUPROPION HCL ER (XL) 300 MG PO TB24
ORAL_TABLET | Freq: Every day | ORAL | 0 refills | Status: DC
  Filled 2020-09-30: qty 90, 90d supply, fill #0

## 2020-09-30 NOTE — Progress Notes (Signed)
Follow up on anxiety: GAD7: 2 PHQ9: 4 (no to self harm)  Refill of wellburtin x 3 months. Follow up at that time or as needed.

## 2020-10-27 ENCOUNTER — Encounter: Payer: Self-pay | Admitting: Family Medicine

## 2020-10-27 ENCOUNTER — Encounter: Payer: Self-pay | Admitting: *Deleted

## 2020-10-27 ENCOUNTER — Other Ambulatory Visit: Payer: Self-pay

## 2020-10-27 ENCOUNTER — Ambulatory Visit
Admission: RE | Admit: 2020-10-27 | Discharge: 2020-10-27 | Disposition: A | Discharge status: Home / Self Care | Payer: No Typology Code available for payment source | Source: Ambulatory Visit | Attending: Family Medicine | Admitting: Family Medicine

## 2020-10-27 ENCOUNTER — Ambulatory Visit: Payer: Self-pay | Admitting: *Deleted

## 2020-10-27 ENCOUNTER — Ambulatory Visit (INDEPENDENT_AMBULATORY_CARE_PROVIDER_SITE_OTHER): Payer: No Typology Code available for payment source | Admitting: Family Medicine

## 2020-10-27 VITALS — BP 146/99 | HR 106 | Temp 98.3°F | Resp 16 | Wt 181.0 lb

## 2020-10-27 DIAGNOSIS — R1011 Right upper quadrant pain: Secondary | ICD-10-CM | POA: Diagnosis present

## 2020-10-27 DIAGNOSIS — R110 Nausea: Secondary | ICD-10-CM

## 2020-10-27 MED ORDER — HYDROCODONE-ACETAMINOPHEN 10-325 MG PO TABS
1.0000 | ORAL_TABLET | Freq: Four times a day (QID) | ORAL | 0 refills | Status: DC | PRN
  Filled 2020-10-27: qty 30, 8d supply, fill #0

## 2020-10-27 MED ORDER — ONDANSETRON HCL 4 MG PO TABS
4.0000 mg | ORAL_TABLET | Freq: Three times a day (TID) | ORAL | 1 refills | Status: DC | PRN
  Filled 2020-10-27: qty 20, 7d supply, fill #0

## 2020-10-27 MED ORDER — CARESTART COVID-19 HOME TEST VI KIT
PACK | 0 refills | Status: DC
Start: 2020-10-27 — End: 2021-06-05
  Filled 2020-10-27: qty 2, 4d supply, fill #0

## 2020-10-27 NOTE — Telephone Encounter (Signed)
Reason for Disposition  [1] SEVERE pain (e.g., excruciating) AND [2] present > 1 hour  Answer Assessment - Initial Assessment Questions 1. LOCATION: "Where does it hurt?"      I'm having upper abd pain radiating into my back.  I have pancreatitis.   Having oily stools.     2. RADIATION: "Does the pain shoot anywhere else?" (e.g., chest, back)     Since Friday.   Radiating into my back. 3. ONSET: "When did the pain begin?" (e.g., minutes, hours or days ago)      Abd pain started Friday 4. SUDDEN: "Gradual or sudden onset?"     Suddenly  5. PATTERN "Does the pain come and go, or is it constant?"    - If constant: "Is it getting better, staying the same, or worsening?"      (Note: Constant means the pain never goes away completely; most serious pain is constant and it progresses)     - If intermittent: "How long does it last?" "Do you have pain now?"     (Note: Intermittent means the pain goes away completely between bouts)     Intermittent 6. SEVERITY: "How bad is the pain?"  (e.g., Scale 1-10; mild, moderate, or severe)   - MILD (1-3): doesn't interfere with normal activities, abdomen soft and not tender to touch    - MODERATE (4-7): interferes with normal activities or awakens from sleep, abdomen tender to touch    - SEVERE (8-10): excruciating pain, doubled over, unable to do any normal activities      5 7. RECURRENT SYMPTOM: "Have you ever had this type of stomach pain before?" If Yes, ask: "When was the last time?" and "What happened that time?"      Yes 8. CAUSE: "What do you think is causing the stomach pain?"     Pancreatitis  9. RELIEVING/AGGRAVATING FACTORS: "What makes it better or worse?" (e.g., movement, antacids, bowel movement)     I'm eating bland diet, plenty of water.    10. OTHER SYMPTOMS: "Do you have any other symptoms?" (e.g., back pain, diarrhea, fever, urination pain, vomiting)       Diarrhea, 11. PREGNANCY: "Is there any chance you are pregnant?" "When was your  last menstrual period?"       Not asked  Protocols used: Abdominal Pain - [facility]

## 2020-10-27 NOTE — Progress Notes (Signed)
I,April Miller,acting as a scribe for Megan Mans, MD.,have documented all relevant documentation on the behalf of Megan Mans, MD,as directed by  Megan Mans, MD while in the presence of Megan Mans, MD.   Established patient visit   Patient: Ashley Suarez   DOB: 1985-08-07   35 y.o. Female  MRN: [id number] Visit Date: 10/27/2020  Today's healthcare provider: Megan Mans, MD   Chief Complaint  Patient presents with   Abdominal Pain   Subjective    HPI  Patient has been having abdominal pain for 3 days. Pain is in the UQ and radiates to back. Patient states before she began to have abdominal pain, she was having oily, loose stools. Patient states she has had pancarditis in the past and believes this is a flare-up. Patient states for treatment she has been pushing fluids. 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  She is pushing fluids.  She is not eating.  Pain is a 5/10 when it is worse.  It waxes and wanes.  Presently she is pain-free.  Her husband is with her.  She denies any alcohol intake.      Medications: Outpatient Medications Prior to Visit  Medication Sig   baclofen (LIORESAL) 20 MG tablet TAKE 1 TABLET BY MOUTH 3 TIMES DAILY   buPROPion (WELLBUTRIN XL) 300 MG 24 hr tablet TAKE 1 TABLET (300 MG TOTAL) BY MOUTH DAILY.   fluticasone (FLONASE) 50 MCG/ACT nasal spray PLACE 2 SPRAYS INTO BOTH NOSTRILS DAILY.   hydrOXYzine (ATARAX/VISTARIL) 25 MG tablet TAKE 1 TABLET BY MOUTH EVERY 6 (SIX) HOURS AS NEEDED FOR ANXIETY OR ITCHING   levonorgestrel (MIRENA) 20 MCG/24HR IUD 1 Intra Uterine Device (1 each total) by Intrauterine route once for 1 dose.   Multiple Vitamin (MULTI-VITAMINS) TABS Take by mouth.   ondansetron (ZOFRAN) 4 MG tablet Take 1 tablet (4 mg total) by mouth every 8 (eight) hours as needed.   cholecalciferol (VITAMIN D3) 25 MCG (1000 UNIT) tablet Take 1 tablet (1,000 Units total) by mouth  daily. (Patient not taking: Reported on 10/27/2020)   methocarbamol (ROBAXIN) 500 MG tablet TAKE 1 TABLET BY MOUTH AT BEDTIME AS NEEDED (Patient not taking: Reported on 10/27/2020)   oxyCODONE-acetaminophen (PERCOCET/ROXICET) 5-325 MG tablet Take 1 tablet by mouth every 4 (four) hours as needed for severe pain. (Patient not taking: Reported on 06/24/2020)   varenicline (CHANTIX STARTING MONTH PAK) 0.5 MG X 11 & 1 MG X 42 tablet Take one 0.5 mg tablet by mouth once daily for 3 days, then increase to one 0.5 mg tablet twice daily for 4 days, then increase to one 1 mg tablet twice daily. (Patient not taking: No sig reported)   varenicline (CHANTIX) 0.5 MG tablet TAKE 1 TABLET BY MOUTH ONCE DAILY X 3 DAYS,INCREASE TO 1 TABLET 2 TIMES DAILY X 4 DAYS, INCREASE TO TAKE 2 TABLETS BY MOUTH TWICE DAILY (Patient not taking: Reported on 10/27/2020)   Vitamin D, Ergocalciferol, (DRISDOL) 1.25 MG (50000 UNIT) CAPS capsule TAKE 1 CAPSULE (50,000 UNITS TOTAL) BY MOUTH EVERY 7 (SEVEN) DAYS. (Patient not taking: Reported on 10/27/2020)   No facility-administered medications prior to visit.    Review of Systems      Objective    BP (!) 146/99 (BP Location: Right Arm, Patient Position: Sitting, Cuff Size: Normal)   Pulse (!) 106   Temp 98.3 F (36.8 C) (Oral)   Resp 16  Wt 181 lb (82.1 kg)   SpO2 98%   BMI 29.21 kg/m  BP Readings from Last 3 Encounters:  10/27/20 (!) 146/99  06/04/20 128/80  04/25/20 (!) 145/92   Wt Readings from Last 3 Encounters:  10/27/20 181 lb (82.1 kg)  06/04/20 171 lb (77.6 kg)  04/25/20 167 lb 9.6 oz (76 kg)       Physical Exam Vitals reviewed.  Constitutional:      Appearance: She is well-developed.  HENT:     Head: Normocephalic and atraumatic.  Pulmonary:     Effort: Pulmonary effort is normal. No respiratory distress.  Abdominal:     General: Bowel sounds are normal.     Palpations: Abdomen is soft.     Tenderness: There is abdominal tenderness in the epigastric  area. There is no guarding or rebound. Negative signs include Murphy's sign and McBurney's sign.  Musculoskeletal:     Cervical back: Normal range of motion and neck supple.  Neurological:     Mental Status: She is alert.  Psychiatric:        Behavior: Behavior normal.        Thought Content: Thought content normal.        Judgment: Judgment normal.      No results found for any visits on 10/27/20.  Assessment & Plan     1. RUQ abdominal pain Likely recurrent pancreatitis.  Obtain labs and gallbladder ultrasound.  If this resolves we will just follow.  May need GI referral in the future.  Because of epigastric tenderness we will try omeprazole. - US ABDOMEN LIMITED RUQ (LIVER/GB) - CBC w/Diff/Platelet - Comprehensive Metabolic Panel (CMET) - Lipase  2. Nausea Patient is comfortable and not acutely ill in the office today. - ondansetron (ZOFRAN) 4 MG tablet; Take 1 tablet (4 mg total) by mouth every 8 (eight) hours as needed.  Dispense: 20 tablet; Refill: 1 - CBC w/Diff/Platelet - Comprehensive Metabolic Panel (CMET) - Lipase    No follow-ups on file.      I, Megan Mans, MD, have reviewed all documentation for this visit. The documentation on 10/30/20 for the exam, diagnosis, procedures, and orders are all accurate and complete.    Ellarae Nevitt Wendelyn Breslow, MD  [facility] [phone] (phone) [phone] (fax)  [facility]
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
## 2020-10-28 ENCOUNTER — Other Ambulatory Visit: Payer: Self-pay

## 2020-10-28 LAB — COMPREHENSIVE METABOLIC PANEL
ALT: 14 IU/L (ref 0–32)
AST: 16 IU/L (ref 0–40)
Albumin/Globulin Ratio: 2.1 (ref 1.2–2.2)
Albumin: 4.6 g/dL (ref 3.8–4.8)
Alkaline Phosphatase: 57 IU/L (ref 44–121)
BUN/Creatinine Ratio: 7 — ABNORMAL LOW (ref 9–23)
BUN: 6 mg/dL (ref 6–20)
Bilirubin Total: 0.3 mg/dL (ref 0.0–1.2)
CO2: 22 mmol/L (ref 20–29)
Calcium: 9.9 mg/dL (ref 8.7–10.2)
Chloride: 101 mmol/L (ref 96–106)
Creatinine, Ser: 0.83 mg/dL (ref 0.57–1.00)
Globulin, Total: 2.2 g/dL (ref 1.5–4.5)
Glucose: 121 mg/dL — ABNORMAL HIGH (ref 65–99)
Potassium: 4 mmol/L (ref 3.5–5.2)
Sodium: 137 mmol/L (ref 134–144)
Total Protein: 6.8 g/dL (ref 6.0–8.5)
eGFR: 93 mL/min/{1.73_m2} (ref >59)

## 2020-10-28 LAB — CBC WITH DIFFERENTIAL/PLATELET
Basophils Absolute: 0 10*3/uL (ref 0.0–0.2)
Basos: 0 %
EOS (ABSOLUTE): 0 10*3/uL (ref 0.0–0.4)
Eos: 0 %
Hematocrit: 40.1 % (ref 34.0–46.6)
Hemoglobin: 13.9 g/dL (ref 11.1–15.9)
Immature Grans (Abs): 0 10*3/uL (ref 0.0–0.1)
Immature Granulocytes: 0 %
Lymphocytes Absolute: 1.4 10*3/uL (ref 0.7–3.1)
Lymphs: 18 %
MCH: 34.1 pg — ABNORMAL HIGH (ref 26.6–33.0)
MCHC: 34.7 g/dL (ref 31.5–35.7)
MCV: 98 fL — ABNORMAL HIGH (ref 79–97)
Monocytes Absolute: 0.3 10*3/uL (ref 0.1–0.9)
Monocytes: 4 %
Neutrophils Absolute: 5.9 10*3/uL (ref 1.4–7.0)
Neutrophils: 78 %
Platelets: 243 10*3/uL (ref 150–450)
RBC: 4.08 x10E6/uL (ref 3.77–5.28)
RDW: 12 % (ref 11.7–15.4)
WBC: 7.7 10*3/uL (ref 3.4–10.8)

## 2020-10-28 LAB — LIPASE: Lipase: 39 U/L (ref 14–72)

## 2020-10-30 ENCOUNTER — Other Ambulatory Visit: Payer: Self-pay

## 2020-10-30 ENCOUNTER — Telehealth: Payer: Self-pay | Admitting: *Deleted

## 2020-10-30 DIAGNOSIS — R110 Nausea: Secondary | ICD-10-CM

## 2020-10-30 DIAGNOSIS — R1011 Right upper quadrant pain: Secondary | ICD-10-CM

## 2020-10-30 MED ORDER — OMEPRAZOLE 20 MG PO CPDR
20.0000 mg | DELAYED_RELEASE_CAPSULE | Freq: Every day | ORAL | 3 refills | Status: DC
  Filled 2020-10-30: qty 90, 90d supply, fill #0

## 2020-10-30 NOTE — Telephone Encounter (Signed)
Patient returned call. Sent 20 mg omeprazole to pharmacy.

## 2020-10-30 NOTE — Telephone Encounter (Addendum)
LMOVM for pt to cb concerning omeprazole.

## 2020-10-31 ENCOUNTER — Other Ambulatory Visit: Payer: Self-pay | Admitting: Obstetrics and Gynecology

## 2020-10-31 DIAGNOSIS — R7309 Other abnormal glucose: Secondary | ICD-10-CM

## 2020-11-07 ENCOUNTER — Other Ambulatory Visit: Payer: Self-pay

## 2020-11-07 ENCOUNTER — Other Ambulatory Visit: Payer: No Typology Code available for payment source

## 2020-11-07 DIAGNOSIS — R7309 Other abnormal glucose: Secondary | ICD-10-CM

## 2020-11-08 LAB — HEMOGLOBIN A1C
Est. average glucose Bld gHb Est-mCnc: 100 mg/dL
Hgb A1c MFr Bld: 5.1 % (ref 4.8–5.6)

## 2020-11-10 ENCOUNTER — Other Ambulatory Visit: Payer: Self-pay | Admitting: Obstetrics and Gynecology

## 2020-11-10 DIAGNOSIS — R7301 Impaired fasting glucose: Secondary | ICD-10-CM

## 2020-11-18 ENCOUNTER — Other Ambulatory Visit: Payer: Self-pay

## 2021-03-09 ENCOUNTER — Other Ambulatory Visit: Payer: Self-pay | Admitting: Family Medicine

## 2021-03-09 ENCOUNTER — Other Ambulatory Visit: Payer: Self-pay | Admitting: Obstetrics and Gynecology

## 2021-03-09 ENCOUNTER — Other Ambulatory Visit: Payer: Self-pay

## 2021-03-09 DIAGNOSIS — M62838 Other muscle spasm: Secondary | ICD-10-CM

## 2021-03-09 MED ORDER — IBUPROFEN 800 MG PO TABS
800.0000 mg | ORAL_TABLET | Freq: Three times a day (TID) | ORAL | 1 refills | Status: DC | PRN
  Filled 2021-03-09 – 2021-03-11 (×2): qty 60, 20d supply, fill #0
  Filled 2021-04-14: qty 60, 20d supply, fill #1

## 2021-03-09 NOTE — Progress Notes (Signed)
Rx ibup 800 mg for back pain with sciatica

## 2021-03-09 NOTE — Telephone Encounter (Signed)
Requested medications are due for refill today.  yes  Requested medications are on the active medications list.  yes  Last refill. 01/31/2020  Future visit scheduled.   yes  Notes to clinic.  Medication not delegated.

## 2021-03-10 ENCOUNTER — Other Ambulatory Visit: Payer: Self-pay

## 2021-03-10 MED ORDER — BACLOFEN 20 MG PO TABS
20.0000 mg | ORAL_TABLET | Freq: Three times a day (TID) | ORAL | 1 refills | Status: DC
  Filled 2021-03-10: qty 90, 30d supply, fill #0

## 2021-03-11 ENCOUNTER — Other Ambulatory Visit: Payer: Self-pay

## 2021-03-13 ENCOUNTER — Ambulatory Visit: Payer: No Typology Code available for payment source | Attending: Internal Medicine

## 2021-03-13 ENCOUNTER — Other Ambulatory Visit: Payer: Self-pay

## 2021-03-13 DIAGNOSIS — Z23 Encounter for immunization: Secondary | ICD-10-CM

## 2021-03-13 MED ORDER — PFIZER COVID-19 VAC BIVALENT 30 MCG/0.3ML IM SUSP
INTRAMUSCULAR | 0 refills | Status: DC
  Filled 2021-03-13: qty 0.3, 1d supply, fill #0

## 2021-03-13 NOTE — Progress Notes (Signed)
   Covid-19 Vaccination Clinic  Name:  Ashley Suarez    MRN: [id number] DOB: 03-17-1986  03/13/2021  [name] was observed post Covid-19 immunization for 15 minutes without incident. She was provided with Vaccine Information Sheet and instruction to access the V-Safe system.   [name] was instructed to call 911 with any severe reactions post vaccine: Difficulty breathing  Swelling of face and throat  A fast heartbeat  A bad rash all over body  Dizziness and weakness   Immunizations Administered     Name Date Dose VIS Date Route   Pfizer Covid-19 Vaccine Bivalent Booster 03/13/2021 11:36 AM 0.3 mL 01/14/2021 Intramuscular   Manufacturer: [place]   Lot: [place]: [place], PharmD, MBA Clinical Acute Care Pharmacist

## 2021-04-14 ENCOUNTER — Other Ambulatory Visit: Payer: Self-pay

## 2021-04-28 ENCOUNTER — Other Ambulatory Visit: Payer: Self-pay

## 2021-04-28 ENCOUNTER — Ambulatory Visit (INDEPENDENT_AMBULATORY_CARE_PROVIDER_SITE_OTHER): Payer: No Typology Code available for payment source | Admitting: Family Medicine

## 2021-04-28 ENCOUNTER — Encounter: Payer: Self-pay | Admitting: Family Medicine

## 2021-04-28 VITALS — BP 158/90 | HR 98 | Temp 98.3°F | Wt 187.0 lb

## 2021-04-28 DIAGNOSIS — Z1159 Encounter for screening for other viral diseases: Secondary | ICD-10-CM | POA: Diagnosis not present

## 2021-04-28 DIAGNOSIS — E559 Vitamin D deficiency, unspecified: Secondary | ICD-10-CM | POA: Diagnosis not present

## 2021-04-28 DIAGNOSIS — E669 Obesity, unspecified: Secondary | ICD-10-CM

## 2021-04-28 DIAGNOSIS — R739 Hyperglycemia, unspecified: Secondary | ICD-10-CM

## 2021-04-28 DIAGNOSIS — R030 Elevated blood-pressure reading, without diagnosis of hypertension: Secondary | ICD-10-CM | POA: Diagnosis not present

## 2021-04-28 DIAGNOSIS — F419 Anxiety disorder, unspecified: Secondary | ICD-10-CM

## 2021-04-28 DIAGNOSIS — F17200 Nicotine dependence, unspecified, uncomplicated: Secondary | ICD-10-CM | POA: Diagnosis not present

## 2021-04-28 DIAGNOSIS — M62838 Other muscle spasm: Secondary | ICD-10-CM

## 2021-04-28 DIAGNOSIS — Z0000 Encounter for general adult medical examination without abnormal findings: Secondary | ICD-10-CM | POA: Diagnosis not present

## 2021-04-28 DIAGNOSIS — Z6830 Body mass index (BMI) 30.0-30.9, adult: Secondary | ICD-10-CM

## 2021-04-28 MED ORDER — BUPROPION HCL ER (XL) 300 MG PO TB24
ORAL_TABLET | Freq: Every day | ORAL | 1 refills | Status: DC
  Filled 2021-04-28: qty 90, 90d supply, fill #0

## 2021-04-28 MED ORDER — BACLOFEN 20 MG PO TABS
20.0000 mg | ORAL_TABLET | Freq: Three times a day (TID) | ORAL | 1 refills | Status: DC
Start: 2021-04-28 — End: 2022-03-01
  Filled 2021-04-28: qty 90, 30d supply, fill #0

## 2021-04-28 NOTE — Assessment & Plan Note (Signed)
Discussed importance of healthy weight management Discussed diet and exercise  

## 2021-04-28 NOTE — Assessment & Plan Note (Signed)
May be elevated related to stress - patient is tearful in office today Check home BPs F/u in 43m and recheck and consider antihypertensive Low sodium diet
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
## 2021-04-28 NOTE — Assessment & Plan Note (Signed)
Chronic and uncontrolled Continue wellbutrin Resume therapy - referral placed today May need update of FMLA

## 2021-04-28 NOTE — Assessment & Plan Note (Signed)
3-5 minute discussion regarding the harms of tobacco use, the benefits of cessation, and methods of cessation Discussed that there are medication options to help with cessation Patient is currently precontemplative  Will reassess at next visit  

## 2021-05-07 ENCOUNTER — Encounter: Payer: Self-pay | Admitting: Physician Assistant

## 2021-05-07 ENCOUNTER — Other Ambulatory Visit: Payer: Self-pay

## 2021-05-07 ENCOUNTER — Ambulatory Visit (INDEPENDENT_AMBULATORY_CARE_PROVIDER_SITE_OTHER): Payer: No Typology Code available for payment source | Admitting: Physician Assistant

## 2021-05-07 VITALS — BP 164/94 | HR 83 | Temp 97.9°F | Ht 66.0 in | Wt 184.8 lb

## 2021-05-07 DIAGNOSIS — I10 Essential (primary) hypertension: Secondary | ICD-10-CM | POA: Diagnosis not present

## 2021-05-07 MED ORDER — LISINOPRIL 10 MG PO TABS
10.0000 mg | ORAL_TABLET | Freq: Every day | ORAL | 1 refills | Status: DC
  Filled 2021-05-07: qty 90, 90d supply, fill #0

## 2021-05-07 NOTE — Assessment & Plan Note (Signed)
Given high office Bps and fluctuating BP at home, advised to start lisinopril 10 mg.  She will check her BP at home and report back with any lows, <110/60  F/u 1 mo

## 2021-05-07 NOTE — Progress Notes (Signed)
Established patient visit   Patient: Ashley Suarez   DOB: Feb 19, 1986   35 y.o. Female  MRN: [id number] Visit Date: 05/07/2021  Today's healthcare provider: [name], PA-C   Cc. HTN f/u  Subjective    HPI  Hypertension, follow-up  BP Readings from Last 3 Encounters:  05/07/21 (!) 164/94  04/28/21 (!) 158/90  10/27/20 (!) 146/99   Wt Readings from Last 3 Encounters:  05/07/21 184 lb 12.8 oz (83.8 kg)  04/28/21 187 lb (84.8 kg)  10/27/20 181 lb (82.1 kg)     She was last seen for hypertension 1 months ago.  BP at that visit was 158/90. Management since that visit includes low sodium diet.  She reports excellent compliance with treatment. She is following a Low Sodium diet. She is exercising. She does smoke.  Use of agents associated with hypertension: none.   Outside blood pressures are 160/98,130/78 fluctuating Symptoms: No chest pain No chest pressure  No palpitations No syncope  No dyspnea No orthopnea  No paroxysmal nocturnal dyspnea No lower extremity edema   Pertinent labs: Lab Results  Component Value Date   CHOL 181 04/28/2020   HDL 54 04/28/2020   LDLCALC 98 04/28/2020   TRIG 165 (H) 04/28/2020   CHOLHDL 3.4 04/28/2020   Lab Results  Component Value Date   NA 137 10/27/2020   K 4.0 10/27/2020   CREATININE 0.83 10/27/2020   EGFR 93 10/27/2020   GLUCOSE 121 (H) 10/27/2020   TSH 3.170 04/28/2020     The ASCVD Risk score (Arnett DK, et al., 2019) failed to calculate for the following reasons:   The 2019 ASCVD risk score is only valid for ages 14 to 59   ---------------------------------------------------------------------------------------------------   Medications: Outpatient Medications Prior to Visit  Medication Sig   baclofen (LIORESAL) 20 MG tablet TAKE 1 TABLET BY MOUTH 3 TIMES DAILY   buPROPion (WELLBUTRIN XL) 300 MG 24 hr tablet TAKE 1 TABLET (300 MG TOTAL) BY MOUTH DAILY.   COVID-19 At Home Antigen Test [facility]  COVID-19 HOME TEST) KIT test as directed   COVID-19 mRNA bivalent vaccine, Pfizer, (PFIZER COVID-19 VAC BIVALENT) injection Inject into the muscle.   ibuprofen (ADVIL) 800 MG tablet Take 1 tablet (800 mg total) by mouth every 8 (eight) hours as needed.   Multiple Vitamin (MULTI-VITAMINS) TABS Take by mouth.   omeprazole (PRILOSEC) 20 MG capsule Take 1 capsule (20 mg total) by mouth daily.   ondansetron (ZOFRAN) 4 MG tablet Take 1 tablet (4 mg total) by mouth every 8 (eight) hours as needed.   fluticasone (FLONASE) 50 MCG/ACT nasal spray PLACE 2 SPRAYS INTO BOTH NOSTRILS DAILY.   levonorgestrel (MIRENA) 20 MCG/24HR IUD 1 Intra Uterine Device (1 each total) by Intrauterine route once for 1 dose.   No facility-administered medications prior to visit.    Review of Systems  Constitutional:  Negative for fatigue.  Respiratory:  Negative for shortness of breath.   Cardiovascular:  Negative for chest pain.  Psychiatric/Behavioral:  The patient is nervous/anxious.       Objective    Blood pressure (!) 164/94, pulse 83, temperature 97.9 F (36.6 C), temperature source Oral, height 5' 6" (1.676 m), weight 184 lb 12.8 oz (83.8 kg), SpO2 100 %.   Physical Exam Constitutional:      General: She is awake.     Appearance: She is well-developed.  HENT:     Head: Normocephalic.  Eyes:     Conjunctiva/sclera: Conjunctivae  normal.  Cardiovascular:     Rate and Rhythm: Normal rate and regular rhythm.     Heart sounds: Normal heart sounds.  Pulmonary:     Effort: Pulmonary effort is normal.     Breath sounds: Normal breath sounds.  Skin:    General: Skin is warm.  Neurological:     Mental Status: She is alert and oriented to person, place, and time.  Psychiatric:        Attention and Perception: Attention normal.        Mood and Affect: Mood normal.        Speech: Speech normal.        Behavior: Behavior is cooperative.      No results found for any visits on 05/07/21.  Assessment & Plan      Problem List Items Addressed This Visit       Cardiovascular and Mediastinum   Primary hypertension - Primary    Given high office Bps and fluctuating BP at home, advised to start lisinopril 10 mg.  She will check her BP at home and report back with any lows, <110/60  F/u 1 mo       Relevant Medications   lisinopril (ZESTRIL) 10 MG tablet  Smoking cessation counseling again given.    Return in about 1 month (around 06/07/2021) for hypertension.      I, [name], PA-C have reviewed all documentation for this visit. The documentation on  05/07/2021 for the exam, diagnosis, procedures, and orders are all accurate and complete.    [name], PA-C  [facility] [phone] (phone) [phone] (fax)  [place]

## 2021-05-12 ENCOUNTER — Other Ambulatory Visit: Payer: Self-pay

## 2021-05-21 ENCOUNTER — Encounter: Payer: Self-pay | Admitting: Family Medicine

## 2021-05-25 ENCOUNTER — Other Ambulatory Visit: Payer: Self-pay

## 2021-05-25 MED ORDER — CARESTART COVID-19 HOME TEST VI KIT
PACK | 0 refills | Status: DC
  Filled 2021-05-25: qty 2, 4d supply, fill #0

## 2021-05-29 ENCOUNTER — Ambulatory Visit: Payer: No Typology Code available for payment source | Admitting: Physician Assistant
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
## 2021-06-05 ENCOUNTER — Other Ambulatory Visit: Payer: Self-pay

## 2021-06-05 ENCOUNTER — Encounter: Payer: Self-pay | Admitting: Obstetrics and Gynecology

## 2021-06-05 ENCOUNTER — Ambulatory Visit (INDEPENDENT_AMBULATORY_CARE_PROVIDER_SITE_OTHER): Payer: No Typology Code available for payment source | Admitting: Obstetrics and Gynecology

## 2021-06-05 ENCOUNTER — Ambulatory Visit: Payer: No Typology Code available for payment source | Admitting: Obstetrics and Gynecology

## 2021-06-05 ENCOUNTER — Other Ambulatory Visit (HOSPITAL_COMMUNITY)
Admission: RE | Admit: 2021-06-05 | Discharge: 2021-06-05 | Disposition: A | Discharge status: Home / Self Care | Payer: No Typology Code available for payment source | Source: Ambulatory Visit | Attending: Obstetrics and Gynecology | Admitting: Obstetrics and Gynecology

## 2021-06-05 VITALS — BP 110/60 | Ht 66.0 in | Wt 186.0 lb

## 2021-06-05 DIAGNOSIS — Z1151 Encounter for screening for human papillomavirus (HPV): Secondary | ICD-10-CM

## 2021-06-05 DIAGNOSIS — Z124 Encounter for screening for malignant neoplasm of cervix: Secondary | ICD-10-CM

## 2021-06-05 DIAGNOSIS — Z01419 Encounter for gynecological examination (general) (routine) without abnormal findings: Secondary | ICD-10-CM

## 2021-06-05 DIAGNOSIS — Z30431 Encounter for routine checking of intrauterine contraceptive device: Secondary | ICD-10-CM | POA: Diagnosis not present

## 2021-06-05 NOTE — Patient Instructions (Signed)
I value your feedback and you entrusting us with your care. If you get a [facility] patient survey, I would appreciate you taking the time to let us know about your experience today. Thank you! ? ? ?

## 2021-06-06 LAB — COMPREHENSIVE METABOLIC PANEL
ALT: 21 IU/L (ref 0–32)
AST: 18 IU/L (ref 0–40)
Albumin/Globulin Ratio: 2.5 — ABNORMAL HIGH (ref 1.2–2.2)
Albumin: 4.9 g/dL — ABNORMAL HIGH (ref 3.8–4.8)
Alkaline Phosphatase: 67 IU/L (ref 44–121)
BUN/Creatinine Ratio: 10 (ref 9–23)
BUN: 9 mg/dL (ref 6–20)
Bilirubin Total: 0.4 mg/dL (ref 0.0–1.2)
CO2: 23 mmol/L (ref 20–29)
Calcium: 9.6 mg/dL (ref 8.7–10.2)
Chloride: 103 mmol/L (ref 96–106)
Creatinine, Ser: 0.91 mg/dL (ref 0.57–1.00)
Globulin, Total: 2 g/dL (ref 1.5–4.5)
Glucose: 98 mg/dL (ref 70–99)
Potassium: 5 mmol/L (ref 3.5–5.2)
Sodium: 140 mmol/L (ref 134–144)
Total Protein: 6.9 g/dL (ref 6.0–8.5)
eGFR: 83 mL/min/{1.73_m2} (ref >59)

## 2021-06-06 LAB — LIPID PANEL
Chol/HDL Ratio: 3.4 ratio (ref 0.0–4.4)
Cholesterol, Total: 182 mg/dL (ref 100–199)
HDL: 54 mg/dL (ref >39)
LDL Chol Calc (NIH): 112 mg/dL — ABNORMAL HIGH (ref 0–99)
Triglycerides: 86 mg/dL (ref 0–149)
VLDL Cholesterol Cal: 16 mg/dL (ref 5–40)

## 2021-06-06 LAB — HEMOGLOBIN A1C
Est. average glucose Bld gHb Est-mCnc: 105 mg/dL
Hgb A1c MFr Bld: 5.3 % (ref 4.8–5.6)

## 2021-06-06 LAB — VITAMIN D 25 HYDROXY (VIT D DEFICIENCY, FRACTURES): Vit D, 25-Hydroxy: 11.1 ng/mL — ABNORMAL LOW (ref 30.0–100.0)

## 2021-06-06 LAB — HEPATITIS C ANTIBODY: Hep C Virus Ab: <0.1 s/co ratio (ref 0.0–0.9)

## 2021-06-08 ENCOUNTER — Other Ambulatory Visit: Payer: Self-pay | Admitting: Obstetrics and Gynecology

## 2021-06-08 ENCOUNTER — Other Ambulatory Visit: Payer: Self-pay

## 2021-06-08 DIAGNOSIS — E559 Vitamin D deficiency, unspecified: Secondary | ICD-10-CM

## 2021-06-08 MED ORDER — VITAMIN D (ERGOCALCIFEROL) 1.25 MG (50000 UNIT) PO CAPS
50000.0000 [IU] | ORAL_CAPSULE | ORAL | 0 refills | Status: DC
  Filled 2021-06-08 – 2021-07-17 (×2): qty 13, 90d supply, fill #0

## 2021-06-08 NOTE — Progress Notes (Signed)
Rx Vit D 50,000 IU wkly for 3 months then recheck labs. Pt with Vit D deficiency with PCP.

## 2021-06-09 ENCOUNTER — Ambulatory Visit: Payer: No Typology Code available for payment source | Admitting: Family Medicine

## 2021-06-09 ENCOUNTER — Ambulatory Visit: Payer: No Typology Code available for payment source | Admitting: Obstetrics and Gynecology

## 2021-06-09 LAB — CYTOLOGY - PAP
Comment: NEGATIVE
Diagnosis: NEGATIVE
High risk HPV: NEGATIVE

## 2021-06-23 ENCOUNTER — Other Ambulatory Visit: Payer: Self-pay

## 2021-07-15 ENCOUNTER — Other Ambulatory Visit: Payer: Self-pay | Admitting: Physician Assistant

## 2021-07-15 ENCOUNTER — Encounter: Payer: Self-pay | Admitting: *Deleted

## 2021-07-15 ENCOUNTER — Ambulatory Visit: Payer: Self-pay

## 2021-07-15 DIAGNOSIS — J301 Allergic rhinitis due to pollen: Secondary | ICD-10-CM

## 2021-07-15 NOTE — Telephone Encounter (Signed)
Patient called in and cancelled her appointment stated that she stopped taking the BP medication lisinopril (ZESTRIL) 10 MG tablet because she developed a side effect which was the dry cough and now she is needing a new medication prescribed. Please advise Ph# [phone]  ? ?Left message to call back. ?

## 2021-07-15 NOTE — Telephone Encounter (Signed)
Return call from patient. Message from L. Drubel, PA-C reviewed with patient and patient is willing to switch to losartan 25 mg and f/u scheduled for 08/06/21 OV. Patient requesting if she can do VV instead and she can check her BP while on VV. Please advise.  ?

## 2021-07-15 NOTE — Telephone Encounter (Signed)
This encounter was created in error - please disregard.

## 2021-07-15 NOTE — Telephone Encounter (Signed)
? ?  Chief Complaint: Dry cough from lisinopril. Medication helped BP, but had cough develop. ?Symptoms: Above ?Frequency: Started after taking medication. ?Pertinent Negatives: Patient denies  ?Disposition: [] ED /[] Urgent Care (no appt availability in office) / [] Appointment(In office/virtual)/ []  [facility]/ [] Home Care/ [] Refused Recommended Disposition /[] [facility]/ []  Follow-up with PCP ?Additional Notes: Declines appointment. Asking for PCP to call in "a different medication." Please advise.  ?Answer Assessment - Initial Assessment Questions ?1. NAME of MEDICATION: "What medicine are you calling about?" ?    Lisinopril ?2. QUESTION: "What is your question?" (e.g., double dose of medicine, side effect) ?    Side effect ?3. PRESCRIBING HCP: "Who prescribed it?" Reason: if prescribed by specialist, call should be referred to that group. ?    Drubel ?4. SYMPTOMS: "Do you have any symptoms?" ?    Caused dry cough ?5. SEVERITY: If symptoms are present, ask "Are they mild, moderate or severe?" ?    Moderate ?6. PREGNANCY:  "Is there any chance that you are pregnant?" "When was your last menstrual period?" ?    No ? ?Protocols used: Medication Question Call-A-AH ? ?

## 2021-07-16 ENCOUNTER — Other Ambulatory Visit: Payer: Self-pay

## 2021-07-17 ENCOUNTER — Other Ambulatory Visit: Payer: Self-pay

## 2021-07-20 ENCOUNTER — Other Ambulatory Visit: Payer: Self-pay | Admitting: Physician Assistant

## 2021-07-20 ENCOUNTER — Other Ambulatory Visit: Payer: Self-pay

## 2021-07-20 DIAGNOSIS — J301 Allergic rhinitis due to pollen: Secondary | ICD-10-CM

## 2021-07-20 MED ORDER — FLUTICASONE PROPIONATE 50 MCG/ACT NA SUSP
2.0000 | Freq: Every day | NASAL | 5 refills | Status: DC
  Filled 2021-07-20: qty 16, 30d supply, fill #0
  Filled 2022-03-02: qty 16, 30d supply, fill #1
  Filled 2022-03-25: qty 16, 30d supply, fill #2

## 2021-07-21 ENCOUNTER — Encounter: Payer: Self-pay | Admitting: Physician Assistant

## 2021-07-21 ENCOUNTER — Other Ambulatory Visit: Payer: Self-pay

## 2021-07-21 ENCOUNTER — Ambulatory Visit: Payer: No Typology Code available for payment source | Admitting: Physician Assistant

## 2021-07-21 DIAGNOSIS — I10 Essential (primary) hypertension: Secondary | ICD-10-CM

## 2021-07-21 MED ORDER — LOSARTAN POTASSIUM 25 MG PO TABS
25.0000 mg | ORAL_TABLET | Freq: Every day | ORAL | 1 refills | Status: DC
  Filled 2021-07-21: qty 90, 90d supply, fill #0
  Filled 2021-10-20: qty 90, 90d supply, fill #1

## 2021-08-05 NOTE — Progress Notes (Signed)
? ?I,Axell Trigueros,acting as a scribe for [employer], PA-C.,have documented all relevant documentation on the behalf of Ashley Ferguson, PA-C,as directed by  Ashley Ferguson, PA-C while in the presence of Ashley Ferguson, PA-C. ? ?MyChart Video Visit ? ? ? ?Virtual Visit via Video Note  ? ?This visit type was conducted due to national recommendations for restrictions regarding the COVID-19 Pandemic (e.g. social distancing) in an effort to limit this patient's exposure and mitigate transmission in our community. This patient is at least at moderate risk for complications without adequate follow up. This format is felt to be most appropriate for this patient at this time. Physical exam was limited by quality of the video and audio technology used for the visit.  ? ?Patient location: in car ?Provider location: [facility] ? ?I discussed the limitations of evaluation and management by telemedicine and the availability of in person appointments. The patient expressed understanding and agreed to proceed. ? ?Patient: Ashley Suarez   DOB: 1985-11-11   36 y.o. Female  MRN: [id number] ?Visit Date: 08/06/2021 ? ?Today's healthcare provider: [name], PA-C  ? ?Cc. HTN f/u ? ?Subjective  ?  ?HPI  ?Questions about acute pancreatitis flares. Despite increased abdominal pain and oily stools when she has fatty food or excessive alcohol. Reports drinking 1-2 glasses of wine a day. Wonders if probiotics will help.  ? ?Hypertension, follow-up ? ?BP Readings from Last 3 Encounters:  ?08/06/21 134/84  ?06/05/21 110/60  ?05/07/21 (!) 164/94  ? Wt Readings from Last 3 Encounters:  ?06/05/21 186 lb (84.4 kg)  ?05/07/21 184 lb 12.8 oz (83.8 kg)  ?04/28/21 187 lb (84.8 kg)  ?  ? ?She was last seen for hypertension 3 months ago.  ?BP at that visit was 164/94. Management since that visit includes advised to start lisinopril 10 mg. ? Lisinopril d/c on 07/15/21 and advised to start losartan 25 mg.  Reports resolution of  cough, denies SE from medication changes. BP in morning typically 130s/80s, goes down during the day. Takes BP medication in the AM.  ? ?She reports excellent compliance with treatment. ?She is not having side effects.  ?She is following a Low Sodium diet. ?She is exercising. 15-20 mins walking during the day ?She does smoke. ? ?Use of agents associated with hypertension: none.  ? ?Symptoms: ?No chest pain No chest pressure  ?No palpitations No syncope  ?No dyspnea No orthopnea  ?No paroxysmal nocturnal dyspnea No lower extremity edema  ? ?Pertinent labs: ?Lab Results  ?Component Value Date  ? CHOL 182 06/05/2021  ? HDL 54 06/05/2021  ? LDLCALC 112 (H) 06/05/2021  ? TRIG 86 06/05/2021  ? CHOLHDL 3.4 06/05/2021  ? Lab Results  ?Component Value Date  ? NA 140 06/05/2021  ? K 5.0 06/05/2021  ? CREATININE 0.91 06/05/2021  ? EGFR 83 06/05/2021  ? GLUCOSE 98 06/05/2021  ? TSH 3.170 04/28/2020  ?  ? ?The ASCVD Risk score (Arnett DK, et al., 2019) failed to calculate for the following reasons: ?  The 2019 ASCVD risk score is only valid for ages 67 to 52  ? ?---------------------------------------------------------------------------------------------------  ? ? ?Medications: ?Outpatient Medications Prior to Visit  ?Medication Sig  ? baclofen (LIORESAL) 20 MG tablet TAKE 1 TABLET BY MOUTH 3 TIMES DAILY  ? buPROPion (WELLBUTRIN XL) 300 MG 24 hr tablet TAKE 1 TABLET (300 MG TOTAL) BY MOUTH DAILY.  ? fluticasone (FLONASE) 50 MCG/ACT nasal spray PLACE 2 SPRAYS INTO BOTH NOSTRILS DAILY.  ? ibuprofen (ADVIL)  800 MG tablet Take 1 tablet (800 mg total) by mouth every 8 (eight) hours as needed.  ? losartan (COZAAR) 25 MG tablet Take 1 tablet (25 mg total) by mouth daily.  ? Multiple Vitamin (MULTI-VITAMINS) TABS Take by mouth.  ? omeprazole (PRILOSEC) 20 MG capsule Take 1 capsule (20 mg total) by mouth daily.  ? ondansetron (ZOFRAN) 4 MG tablet Take 1 tablet (4 mg total) by mouth every 8 (eight) hours as needed.  ? Vitamin D,  Ergocalciferol, (DRISDOL) 1.25 MG (50000 UNIT) CAPS capsule Take 1 capsule (50,000 Units total) by mouth every 7 (seven) days.  ? levonorgestrel (MIRENA) 20 MCG/24HR IUD 1 Intra Uterine Device (1 each total) by Intrauterine route once for 1 dose.  ? ?No facility-administered medications prior to visit.  ? ? ?Review of Systems  ?Constitutional:  Negative for fatigue and fever.  ?Respiratory:  Negative for cough and shortness of breath.   ?Cardiovascular:  Negative for chest pain and leg swelling.  ?Gastrointestinal:  Negative for abdominal pain.  ?Neurological:  Negative for dizziness and headaches.  ? ? ? Objective  ?  ?BP 134/84 Comment: home value ? ? ?Physical Exam ?Constitutional:   ?   General: She is not in acute distress. ?Neurological:  ?   Mental Status: She is oriented to person, place, and time.  ?Psychiatric:     ?   Mood and Affect: Mood normal.     ?   Behavior: Behavior normal.  ?  ? ? ? Assessment & Plan  ?  ? ?Problem List Items Addressed This Visit   ? ?  ? Cardiovascular and Mediastinum  ? Primary hypertension - Primary  ?  Improved with losartan, no side effects. Pt can trying switching medication to evening to see if this improves AM blood pressures. ?Advised we should repeat CMP in summer, but otherwise continue medication and call office if elevated pressures at home.  ?  ?  ?  ? Other  ? H/O acute pancreatitis  ?  Advised if pt continues to have 'flares' to reestablish with GI ?Unsure if probiotics would help her, but she could try, discussed how they work and some to try.  ?Advised avoiding fatty foods and alcohol would probably help her the most ?  ?  ?  ? ?I discussed the assessment and treatment plan with the patient. The patient was provided an opportunity to ask questions and all were answered. The patient agreed with the plan and demonstrated an understanding of the instructions. ?  ?The patient was advised to call back or seek an in-person evaluation if the symptoms worsen or if the  condition fails to improve as anticipated. ? ?I provided 20 minutes of non-face-to-face time during this encounter. ? ?{I, Ashley Ferguson, PA-C have reviewed all documentation for this visit. The documentation on  08/06/2021 for the exam, diagnosis, procedures, and orders are all accurate and complete. ? ?Ashley Ferguson, PA-C ? Family Practice ?[address] ?[place], [place], [address] ?Office: [phone] ?Fax: [phone]  ? ? ?[facility]  ? ?

## 2021-08-06 ENCOUNTER — Encounter: Payer: Self-pay | Admitting: Physician Assistant

## 2021-08-06 ENCOUNTER — Telehealth (INDEPENDENT_AMBULATORY_CARE_PROVIDER_SITE_OTHER): Payer: No Typology Code available for payment source | Admitting: Physician Assistant

## 2021-08-06 ENCOUNTER — Other Ambulatory Visit: Payer: Self-pay

## 2021-08-06 VITALS — BP 134/84

## 2021-08-06 DIAGNOSIS — Z8719 Personal history of other diseases of the digestive system: Secondary | ICD-10-CM

## 2021-08-06 DIAGNOSIS — I10 Essential (primary) hypertension: Secondary | ICD-10-CM | POA: Diagnosis not present

## 2021-08-06 NOTE — Assessment & Plan Note (Signed)
Improved with losartan, no side effects. Pt can trying switching medication to evening to see if this improves AM blood pressures. ?Advised we should repeat CMP in summer, but otherwise continue medication and call office if elevated pressures at home.  ?

## 2021-08-06 NOTE — Assessment & Plan Note (Signed)
Advised if pt continues to have 'flares' to reestablish with GI ?Unsure if probiotics would help her, but she could try, discussed how they work and some to try.  ?Advised avoiding fatty foods and alcohol would probably help her the most ?

## 2021-10-20 ENCOUNTER — Other Ambulatory Visit: Payer: Self-pay

## 2021-12-14 ENCOUNTER — Encounter: Payer: Self-pay | Admitting: Physician Assistant

## 2021-12-22 NOTE — Progress Notes (Unsigned)
     I,[name],acting as a Education administrator for [employer], PA-C.,have documented all relevant documentation on the behalf of [name], PA-C,as directed by  [name], PA-C while in the presence of [name], PA-C.   Established patient visit   Patient: Ashley Suarez   DOB: [birth year]   36 y.o. Female  MRN: [id number] Visit Date: 12/23/2021  Today's healthcare provider: [name], PA-C   No chief complaint on file.  Subjective    HPI  Depression, Follow-up  She  was last seen for this {NUMBERS 1-12:18279} {days/wks/mos/yrs:310907} ago. Changes made at last visit include ***.   She reports {excellent/good/fair/poor:19665} compliance with treatment. She {is/is not:21021397} having side effects. ***  She reports {DESC; GOOD/FAIR/POOR:18685} tolerance of treatment. Current symptoms include: {Symptoms; depression:1002} She feels she is {improved/worse/unchanged:3041574} since last visit.     04/28/2021   11:30 AM 04/25/2020    2:27 PM 05/30/2019    8:20 AM  Depression screen PHQ 2/9  Decreased Interest '2 1 2  '$ Down, Depressed, Hopeless '1 2 1  '$ PHQ - 2 Score '3 3 3  '$ Altered sleeping '2 3 3  '$ Tired, decreased energy '1 2 2  '$ Change in appetite 1 3 0  Feeling bad or failure about yourself  '1 2 2  '$ Trouble concentrating '2 1 1  '$ Moving slowly or fidgety/restless 1 2 0  Suicidal thoughts 0 0 0  PHQ-9 Score '11 16 11  '$ Difficult doing work/chores Very difficult Very difficult Somewhat difficult    -----------------------------------------------------------------------------------------   Medications: Outpatient Medications Prior to Visit  Medication Sig   baclofen (LIORESAL) 20 MG tablet TAKE 1 TABLET BY MOUTH 3 TIMES DAILY   buPROPion (WELLBUTRIN XL) 300 MG 24 hr tablet TAKE 1 TABLET (300 MG TOTAL) BY MOUTH DAILY.   fluticasone (FLONASE) 50 MCG/ACT nasal spray PLACE 2 SPRAYS INTO BOTH NOSTRILS DAILY.   ibuprofen (ADVIL) 800 MG tablet Take 1 tablet (800 mg  total) by mouth every 8 (eight) hours as needed.   levonorgestrel (MIRENA) 20 MCG/24HR IUD 1 Intra Uterine Device (1 each total) by Intrauterine route once for 1 dose.   losartan (COZAAR) 25 MG tablet Take 1 tablet (25 mg total) by mouth daily.   Multiple Vitamin (MULTI-VITAMINS) TABS Take by mouth.   omeprazole (PRILOSEC) 20 MG capsule Take 1 capsule (20 mg total) by mouth daily.   ondansetron (ZOFRAN) 4 MG tablet Take 1 tablet (4 mg total) by mouth every 8 (eight) hours as needed.   Vitamin D, Ergocalciferol, (DRISDOL) 1.25 MG (50000 UNIT) CAPS capsule Take 1 capsule (50,000 Units total) by mouth every 7 (seven) days.   No facility-administered medications prior to visit.    Review of Systems  {Labs  Heme  Chem  Endocrine  Serology  Results Review (optional):23779}   Objective    There were no vitals taken for this visit. {Show previous vital signs (optional):23777}  Physical Exam  ***  No results found for any visits on 12/23/21.  Assessment & Plan     ***  No follow-ups on file.      {provider attestation***:1}   [name], PA-C  [facility] [phone] (phone) [phone] (fax)  [place]

## 2021-12-23 ENCOUNTER — Encounter: Payer: Self-pay | Admitting: Physician Assistant

## 2021-12-23 ENCOUNTER — Ambulatory Visit (INDEPENDENT_AMBULATORY_CARE_PROVIDER_SITE_OTHER): Payer: No Typology Code available for payment source | Admitting: Physician Assistant

## 2021-12-23 VITALS — BP 114/71 | HR 89 | Ht 67.0 in | Wt 193.2 lb

## 2021-12-23 DIAGNOSIS — F3176 Bipolar disorder, in full remission, most recent episode depressed: Secondary | ICD-10-CM | POA: Diagnosis not present

## 2021-12-23 DIAGNOSIS — F909 Attention-deficit hyperactivity disorder, unspecified type: Secondary | ICD-10-CM | POA: Diagnosis not present

## 2021-12-23 DIAGNOSIS — F4312 Post-traumatic stress disorder, chronic: Secondary | ICD-10-CM | POA: Diagnosis not present

## 2021-12-23 DIAGNOSIS — I10 Essential (primary) hypertension: Secondary | ICD-10-CM | POA: Diagnosis not present

## 2021-12-23 NOTE — Assessment & Plan Note (Signed)
Contributing to overall mental health; ref to psych, continue with therapy

## 2021-12-23 NOTE — Assessment & Plan Note (Signed)
Previously tried adderall, ritalin without success, felt 'crash', wellbutrin pt felt no diference Hesitant to start other medication, ref to psych

## 2021-12-23 NOTE — Patient Instructions (Signed)
[email or web address]

## 2021-12-23 NOTE — Assessment & Plan Note (Signed)
Currently unmediated. Referred back to psychiatry. Completed FMLA paperwork to give are 1 day a week if needed to leave early/take a day/ if feeling mentally unwell and not able to work.  Encouraged her to continue therapy.

## 2021-12-23 NOTE — Assessment & Plan Note (Signed)
Very elevated in office, but WNL at home on numerous occasions. Advised pt to continue to monitor and if elevated at home return to office

## 2022-01-05 ENCOUNTER — Encounter: Payer: Self-pay | Admitting: Physician Assistant

## 2022-01-26 ENCOUNTER — Encounter: Payer: Self-pay | Admitting: Physician Assistant

## 2022-03-01 ENCOUNTER — Telehealth: Payer: Self-pay | Admitting: Physician Assistant

## 2022-03-01 ENCOUNTER — Other Ambulatory Visit: Payer: Self-pay

## 2022-03-01 ENCOUNTER — Other Ambulatory Visit: Payer: Self-pay | Admitting: Physician Assistant

## 2022-03-01 ENCOUNTER — Encounter: Payer: Self-pay | Admitting: Psychiatry

## 2022-03-01 ENCOUNTER — Telehealth (HOSPITAL_COMMUNITY): Payer: Self-pay | Admitting: Licensed Clinical Social Worker

## 2022-03-01 ENCOUNTER — Ambulatory Visit (INDEPENDENT_AMBULATORY_CARE_PROVIDER_SITE_OTHER): Payer: No Typology Code available for payment source | Admitting: Psychiatry

## 2022-03-01 VITALS — BP 176/121 | HR 102 | Temp 98.7°F | Ht 67.0 in | Wt 190.0 lb

## 2022-03-01 DIAGNOSIS — Z789 Other specified health status: Secondary | ICD-10-CM | POA: Insufficient documentation

## 2022-03-01 DIAGNOSIS — F609 Personality disorder, unspecified: Secondary | ICD-10-CM | POA: Diagnosis not present

## 2022-03-01 DIAGNOSIS — F319 Bipolar disorder, unspecified: Secondary | ICD-10-CM

## 2022-03-01 DIAGNOSIS — F419 Anxiety disorder, unspecified: Secondary | ICD-10-CM

## 2022-03-01 DIAGNOSIS — I10 Essential (primary) hypertension: Secondary | ICD-10-CM

## 2022-03-01 DIAGNOSIS — F1411 Cocaine abuse, in remission: Secondary | ICD-10-CM | POA: Insufficient documentation

## 2022-03-01 DIAGNOSIS — F17200 Nicotine dependence, unspecified, uncomplicated: Secondary | ICD-10-CM

## 2022-03-01 HISTORY — DX: Other specified health status: Z78.9

## 2022-03-01 MED ORDER — LOSARTAN POTASSIUM 25 MG PO TABS
25.0000 mg | ORAL_TABLET | Freq: Every day | ORAL | 0 refills | Status: DC
  Filled 2022-03-01: qty 13, 13d supply, fill #0
  Filled 2022-03-01: qty 17, 17d supply, fill #0

## 2022-03-01 NOTE — Progress Notes (Unsigned)
     I,[name],acting as a Education administrator for [employer], PA-C.,have documented all relevant documentation on the behalf of Ashley Kirschner, PA-C,as directed by  Ashley Kirschner, PA-C while in the presence of Ashley Kirschner, PA-C.   Established patient visit   Patient: Ashley Suarez   DOB: Apr 30, 1986   36 y.o. Female  MRN: [id number] Visit Date: 03/02/2022  Today's healthcare provider: [name], PA-C   No chief complaint on file.  Subjective    HPI  Patient would like to discuss psych referral  Medications: Outpatient Medications Prior to Visit  Medication Sig   fluticasone (FLONASE) 50 MCG/ACT nasal spray PLACE 2 SPRAYS INTO BOTH NOSTRILS DAILY.   levonorgestrel (MIRENA) 20 MCG/24HR IUD 1 Intra Uterine Device (1 each total) by Intrauterine route once for 1 dose.   losartan (COZAAR) 25 MG tablet Take 1 tablet (25 mg total) by mouth daily. Please make an appointment in office to discuss medication   Multiple Vitamin (MULTI-VITAMINS) TABS Take by mouth.   omeprazole (PRILOSEC) 20 MG capsule Take 1 capsule (20 mg total) by mouth daily.   ondansetron (ZOFRAN) 4 MG tablet Take 1 tablet (4 mg total) by mouth every 8 (eight) hours as needed.   Vitamin D, Ergocalciferol, (DRISDOL) 1.25 MG (50000 UNIT) CAPS capsule Take 1 capsule (50,000 Units total) by mouth every 7 (seven) days.   No facility-administered medications prior to visit.    Review of Systems  {Labs  Heme  Chem  Endocrine  Serology  Results Review (optional):23779}   Objective    There were no vitals taken for this visit. {Show previous vital signs (optional):23777}  Physical Exam  ***  No results found for any visits on 03/02/22.  Assessment & Plan     ***  No follow-ups on file.      {provider attestation***:1}   Ashley Kirschner, PA-C  [facility] [phone] (phone) [phone] (fax)  [place]

## 2022-03-01 NOTE — Telephone Encounter (Signed)
CB- [phone] Pt is calling to request if she can be sent to another office other than Dr. Shea Evans. Pt reports that she was dismissed from the practice.  And would like to know if Ria Comment could prescribe her QUEtiapine (SEROQUEL) 50 MG tablet [[id number]]  DISCONTINUED.  Please advise

## 2022-03-01 NOTE — Telephone Encounter (Signed)
The therapist calls [place] per the request of Dr. Shea Evans and confirms her identity via two identifiers.  She says that she was seen for a psychiatric intake today and adds that she has a therapist she sees weekly at [facility] whom she has been seeing since February of this year.   Sussan drinks a bottle of wine or five standard drinks noting that it is not daily but can be up to five days per week. She says that she only recently started drinking this much around last fall so is going on a year. Prior to this, she says that she would drink a bottle of wine on weekends.   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 She says that she has addressed the fact that she is overloaded and doing the job of more than one person with her Supervisors and that changes are in the works to address this situation.  When asked about psychotropic medications, she says that she went to her PCP and was on Wellbutrin from February to June of this year but was referred to Dr. Shea Evans as her depression was worsening.  Baljit was diagnosed in the past with Bipolar Disorder noting that Seroquel is the only thing that seemed to work. The doctor who was prescribing this for her in the past left the practice and for reasons unknown, tapered her off it.   The therapist inquires about alcohol withdrawal. Ladawn says that after the first day that she does not drink that she experiences some mild shaking and fatigue. She says that she last drank on Friday, 02/26/22, consuming four shots of alcohol.   The therapist educates her about the risks associated with ETOH-withdrawal noting that her elevated blood pressure and tachycardia at her appointment this morning are of concern. The therapist also educates her about bipolar disorder as a chronic but treatable disorder informing her that she would need to stay on a mood  stabilizer indefinitely.   The therapist talks to her about different treatment options such as going to detox and asked to be started back on her Seroquel post-discharge as she does not see Dr. Shea Evans again for two months.  Mikal is going to call her regular therapist today to discuss treatment options and ask if her therapist would be able to address her alcohol use; and if not, if she would be o.k. with Clarise Cruz seeing this therapist to address this as needed. AA is also discussed as an option; however, Makinzy sounds to be turned off somewhat from the spirituality aspect of the program.  She is given this therapist's direct callback number and will callback on a p.r.n. basis after talking to her therapist.  Adam Phenix, [place], [place], [facility], [place] 03/01/2022

## 2022-03-01 NOTE — Telephone Encounter (Signed)
Please advise 
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
## 2022-03-02 ENCOUNTER — Telehealth (HOSPITAL_COMMUNITY): Payer: Self-pay | Admitting: Licensed Clinical Social Worker

## 2022-03-02 ENCOUNTER — Other Ambulatory Visit: Payer: Self-pay

## 2022-03-02 ENCOUNTER — Encounter: Payer: Self-pay | Admitting: Physician Assistant

## 2022-03-02 ENCOUNTER — Ambulatory Visit (INDEPENDENT_AMBULATORY_CARE_PROVIDER_SITE_OTHER): Payer: No Typology Code available for payment source | Admitting: Physician Assistant

## 2022-03-02 VITALS — BP 138/76 | HR 93 | Ht 66.0 in | Wt 188.7 lb

## 2022-03-02 DIAGNOSIS — I10 Essential (primary) hypertension: Secondary | ICD-10-CM

## 2022-03-02 DIAGNOSIS — F319 Bipolar disorder, unspecified: Secondary | ICD-10-CM

## 2022-03-02 NOTE — Telephone Encounter (Signed)
Patient seen today

## 2022-03-02 NOTE — Assessment & Plan Note (Addendum)
Elevation in office likely 2/2 to emotional state, advised to increase to losartan 50 mg but monitor BP at home for hypotension F/u 6-8 weeks

## 2022-03-02 NOTE — Telephone Encounter (Signed)
The therapist returns Edna's call confirming her identity via two identifiers.  She saw her PCP and says that her PCP put in a referral to [place]. She admits to having a "couple of drinks" on 02/28/22  Her BP at her PCP visit today was 138/76. She has been keeping her BP at home using a wrist cuff with the therapist informing her that wrist cuffs tend to be inaccurate and that she likely needs to consider getting an arm pressure cuff.  She spoke with her therapist who is not addressing Bryson's alcohol use with the therapist noting that she is willing to "step back" if this therapist were to take over the case.  The therapist informs Angellica that she could be seen individually or attend SA IOP with Dola opting for the former.  Thus, the therapist will have the Receptionist call her to get on his schedule when she returns in the morning.   [address] [place], LCSW, [facility], LCAS 03/02/2022

## 2022-03-02 NOTE — Assessment & Plan Note (Addendum)
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 Advised calling Ashley Suarez office to see if a compromise can be made-- if she can start medication while doing the IOP. I do think Ashley Suarez recommendations are appropriate given the nature of medication Ashley Suarez needs.  Per pt I did refer to another psych office to get her a second opinion.   Discussed w/ patient if she feels unsafe, or out of control -- to go to the ED for treatment.  Pt expressed understanding and feels more comfortable with more options.

## 2022-03-11 ENCOUNTER — Ambulatory Visit (INDEPENDENT_AMBULATORY_CARE_PROVIDER_SITE_OTHER): Payer: No Typology Code available for payment source | Admitting: Licensed Clinical Social Worker

## 2022-03-11 DIAGNOSIS — F1020 Alcohol dependence, uncomplicated: Secondary | ICD-10-CM | POA: Diagnosis not present

## 2022-03-11 DIAGNOSIS — F4310 Post-traumatic stress disorder, unspecified: Secondary | ICD-10-CM

## 2022-03-11 DIAGNOSIS — F319 Bipolar disorder, unspecified: Secondary | ICD-10-CM
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
## 2022-03-16 ENCOUNTER — Ambulatory Visit (HOSPITAL_BASED_OUTPATIENT_CLINIC_OR_DEPARTMENT_OTHER): Payer: No Typology Code available for payment source | Admitting: Psychiatry

## 2022-03-16 ENCOUNTER — Other Ambulatory Visit: Payer: Self-pay

## 2022-03-16 DIAGNOSIS — F1411 Cocaine abuse, in remission: Secondary | ICD-10-CM | POA: Diagnosis not present

## 2022-03-16 DIAGNOSIS — F1021 Alcohol dependence, in remission: Secondary | ICD-10-CM | POA: Diagnosis not present

## 2022-03-16 DIAGNOSIS — F319 Bipolar disorder, unspecified: Secondary | ICD-10-CM | POA: Diagnosis not present

## 2022-03-16 DIAGNOSIS — F609 Personality disorder, unspecified: Secondary | ICD-10-CM | POA: Diagnosis not present

## 2022-03-16 MED ORDER — NALTREXONE HCL 50 MG PO TABS
50.0000 mg | ORAL_TABLET | Freq: Every day | ORAL | 1 refills | Status: DC
  Filled 2022-03-16: qty 12, 12d supply, fill #0
  Filled 2022-03-17: qty 18, 18d supply, fill #0

## 2022-03-16 MED ORDER — QUETIAPINE FUMARATE ER 50 MG PO TB24
50.0000 mg | ORAL_TABLET | Freq: Two times a day (BID) | ORAL | 1 refills | Status: DC
  Filled 2022-03-16: qty 60, 30d supply, fill #0
  Filled 2022-04-11: qty 60, 30d supply, fill #1
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
## 2022-03-17 ENCOUNTER — Other Ambulatory Visit: Payer: Self-pay

## 2022-03-17 ENCOUNTER — Encounter (HOSPITAL_COMMUNITY): Payer: Self-pay | Admitting: Psychiatry

## 2022-03-18 ENCOUNTER — Other Ambulatory Visit: Payer: Self-pay

## 2022-03-25 ENCOUNTER — Other Ambulatory Visit: Payer: Self-pay | Admitting: Physician Assistant

## 2022-03-25 ENCOUNTER — Other Ambulatory Visit: Payer: Self-pay

## 2022-03-25 DIAGNOSIS — I10 Essential (primary) hypertension: Secondary | ICD-10-CM

## 2022-03-25 MED ORDER — LOSARTAN POTASSIUM 25 MG PO TABS
25.0000 mg | ORAL_TABLET | Freq: Every day | ORAL | 1 refills | Status: DC
  Filled 2022-03-25: qty 90, 90d supply, fill #0

## 2022-03-25 NOTE — Telephone Encounter (Signed)
Requested Prescriptions  Pending Prescriptions Disp Refills   losartan (COZAAR) 25 MG tablet 90 tablet 1    Sig: Take 1 tablet (25 mg total) by mouth daily. Please make an appointment in office to discuss medication     Cardiovascular:  Angiotensin Receptor Blockers Failed - 03/25/2022 12:17 PM      Failed - Cr in normal range and within 180 days    Creatinine, Ser  Date Value Ref Range Status  06/05/2021 0.91 0.57 - 1.00 mg/dL Final         Failed - K in normal range and within 180 days    Potassium  Date Value Ref Range Status  06/05/2021 5.0 3.5 - 5.2 mmol/L Final         Passed - Patient is not pregnant      Passed - Last BP in normal range    BP Readings from Last 1 Encounters:  03/02/22 138/76         Passed - Valid encounter within last 6 months    Recent Outpatient Visits           3 weeks ago Bipolar and related disorder [facility])   [facility] Thedore Mins, [place], PA-C   3 months ago Bipolar disorder, in full remission, most recent episode depressed [facility])   [facility] Thedore Mins, [place], PA-C   7 months ago Primary hypertension   [facility] Thedore Mins, [place], PA-C   10 months ago Primary hypertension   [facility] Thedore Mins, Ria Comment, PA-C   11 months ago Encounter for annual physical exam   [employer] [facility], Dionne Bucy, MD       Future Appointments             In 1 month Thedore Mins, Ria Comment, PA-C [employer], [facility]

## 2022-03-26 ENCOUNTER — Other Ambulatory Visit: Payer: Self-pay

## 2022-04-12 ENCOUNTER — Other Ambulatory Visit: Payer: Self-pay

## 2022-04-13 ENCOUNTER — Other Ambulatory Visit: Payer: Self-pay

## 2022-04-16 ENCOUNTER — Encounter (HOSPITAL_COMMUNITY): Payer: No Typology Code available for payment source | Admitting: Psychiatry

## 2022-04-19 ENCOUNTER — Ambulatory Visit (INDEPENDENT_AMBULATORY_CARE_PROVIDER_SITE_OTHER): Payer: No Typology Code available for payment source | Admitting: Obstetrics & Gynecology

## 2022-04-19 DIAGNOSIS — N816 Rectocele: Secondary | ICD-10-CM

## 2022-04-19 DIAGNOSIS — N812 Incomplete uterovaginal prolapse: Secondary | ICD-10-CM | POA: Diagnosis not present

## 2022-04-19 DIAGNOSIS — N8110 Cystocele, unspecified: Secondary | ICD-10-CM

## 2022-04-19 NOTE — Progress Notes (Signed)
   Established Patient Office Visit  Subjective   Patient ID: Ashley Suarez, female    DOB: 1985/12/12  Age: 36 y.o. MRN: [id number]   HPI   36 yo married P1 here with a 1 month h/o some new onset GSUI and she noted some pelvic organ prolapse when looking with a mirror 2 weeks ago. She denies dyspareunia, does not have to resort to splinting with bowel movements.   ROS Her pap smear is UTD and normal. Objective:     Physical Exam   Well nourished, well hydrated White female, no apparent distress She is ambulating and conversing normally. EG- vaginal introitus c/w previous vaginal delivery Grade 2 cystocele, rectocele, and uterine prolapse on exam  Assessment & Plan:  Symptomatic Grade 2 cystocele, rectocele, and uterine prolapse- We discussed the overall options of medical versus surgical treatment. She would like to start with pelvic PT and perhaps a EMS stimulating device for pelvic floor strengthening.  Problem List Items Addressed This Visit   None   No follow-ups on file.    Emily Filbert, MD

## 2022-04-20 ENCOUNTER — Telehealth (HOSPITAL_COMMUNITY): Payer: No Typology Code available for payment source | Admitting: Psychiatry

## 2022-04-20 ENCOUNTER — Other Ambulatory Visit: Payer: Self-pay

## 2022-04-20 ENCOUNTER — Telehealth (HOSPITAL_COMMUNITY): Payer: Self-pay | Admitting: Psychiatry

## 2022-04-20 ENCOUNTER — Other Ambulatory Visit (HOSPITAL_COMMUNITY): Payer: Self-pay | Admitting: *Deleted

## 2022-04-20 DIAGNOSIS — F319 Bipolar disorder, unspecified: Secondary | ICD-10-CM

## 2022-04-20 DIAGNOSIS — F609 Personality disorder, unspecified: Secondary | ICD-10-CM

## 2022-04-20 DIAGNOSIS — F1021 Alcohol dependence, in remission: Secondary | ICD-10-CM

## 2022-04-20 MED ORDER — QUETIAPINE FUMARATE ER 50 MG PO TB24
50.0000 mg | ORAL_TABLET | Freq: Two times a day (BID) | ORAL | 0 refills | Status: DC
  Filled 2022-04-20 – 2022-05-04 (×2): qty 30, 15d supply, fill #0

## 2022-04-20 NOTE — Telephone Encounter (Signed)
She is a patient of Dr. Leroy Sea who is out.  A 30 days supply of Seroquel XR 50 mg 2 times a day sent to pharmacy.

## 2022-04-27 NOTE — Progress Notes (Signed)
This encounter was created in error - please disregard.

## 2022-04-28 ENCOUNTER — Ambulatory Visit: Payer: No Typology Code available for payment source | Admitting: Psychiatry

## 2022-04-29 ENCOUNTER — Other Ambulatory Visit: Payer: Self-pay

## 2022-04-29 ENCOUNTER — Encounter: Payer: Self-pay | Admitting: Physician Assistant

## 2022-04-29 ENCOUNTER — Ambulatory Visit (INDEPENDENT_AMBULATORY_CARE_PROVIDER_SITE_OTHER): Payer: No Typology Code available for payment source | Admitting: Physician Assistant

## 2022-04-29 VITALS — BP 124/87 | HR 102 | Temp 97.5°F | Ht 66.0 in | Wt 193.9 lb

## 2022-04-29 DIAGNOSIS — Z0000 Encounter for general adult medical examination without abnormal findings: Secondary | ICD-10-CM

## 2022-04-29 DIAGNOSIS — F319 Bipolar disorder, unspecified: Secondary | ICD-10-CM

## 2022-04-29 DIAGNOSIS — I10 Essential (primary) hypertension: Secondary | ICD-10-CM | POA: Diagnosis not present

## 2022-04-29 MED ORDER — LOSARTAN POTASSIUM 50 MG PO TABS
50.0000 mg | ORAL_TABLET | Freq: Every day | ORAL | 1 refills | Status: DC
  Filled 2022-04-29: qty 90, 90d supply, fill #0
  Filled 2022-05-23 – 2022-06-03 (×2): qty 90, 90d supply, fill #1

## 2022-04-29 MED ORDER — BLOOD PRESSURE MONITOR/M CUFF MISC
0 refills | Status: DC
  Filled 2022-04-29: qty 1, 30d supply, fill #0

## 2022-04-29 NOTE — Assessment & Plan Note (Signed)
Elevated in office but improved on home / work checks Continue with losartan 50 mg daily Ordered cmp F/u 6 mo
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
## 2022-04-29 NOTE — Assessment & Plan Note (Signed)
Pt established with new psychiatrist and follows with psychology.

## 2022-05-04 ENCOUNTER — Other Ambulatory Visit: Payer: Self-pay

## 2022-05-04 ENCOUNTER — Ambulatory Visit (HOSPITAL_COMMUNITY)
Admission: EM | Admit: 2022-05-04 | Discharge: 2022-05-04 | Disposition: A | Discharge status: Home / Self Care | Payer: No Typology Code available for payment source

## 2022-05-04 ENCOUNTER — Telehealth (HOSPITAL_COMMUNITY): Payer: Self-pay | Admitting: Licensed Clinical Social Worker

## 2022-05-04 DIAGNOSIS — F32A Depression, unspecified: Secondary | ICD-10-CM

## 2022-05-04 NOTE — Telephone Encounter (Signed)
The therapist returns a call to Ashley Suarez confirming her identity via two identifiers. She says that her psychiatrist is on medical leave. She believes that she is having "side effects" from her medication.   She says that she has been "lashing out" and had some suicidal ideation without intent saying that when she called that the office has kept telling her to go to the [facility] saying that the office is "failing" her.   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 She last had thoughts of suicide 2 hours ago having the thought of "slitting" her throat. She has thought about suicide "maybe" four times since Saturday. She has no intent as she "does not want to die." She has a prior suicide attempt more than 20 years ago.  She called the [facility] but apparently was transferred to the 2nd floor as she was told that walk-in hours ended at 11:30 a.m. The therapist explains that she needs to go to the 24/7 ground level to be seen in the urgent care. She is advised to explain that she is a patient of Dr. Nelida Gores' and has been having intermittent suicidal ideation since Saturday and that she was directed to go to the [facility] to see a med provider as there are no providers at this office who can see her today.  Ashley Suarez thanks the therapist for this information and says that she and her husband are headed to the [facility].  Adam Phenix, [place], LCSW, [facility], [place] 05/04/2022

## 2022-05-04 NOTE — ED Triage Notes (Signed)
Pt presents to [facility] voluntarily accompanied by her husband seeking medication management., Pt states she was referred to this facility by her psychiatrist office due to her psychiatrist being out of the office on medical leave. Pt states she was rescheduled 3 times and her next appointment was pushed out to February. Pt states she was supposed to begin a anti-depressant but has been unable to see her provider. Pt reports unstable mood, anger outbursts, and passive SI. Pt denies any plan or intent. Pt denies HI and AVH

## 2022-05-04 NOTE — ED Provider Notes (Signed)
Behavioral Health Urgent Care Medical Screening Exam  Patient Name: Ashley Suarez MRN: [id number] Date of Evaluation: 05/04/22 Chief Complaint: "Since Saturday, been having some issues with frustration and anger" Diagnosis:  Final diagnoses:  Depression, unspecified depression type   History of Present illness: Ashley Suarez is a 36 y.o. female. Pt presents voluntarily to [facility] behavioral health for walk-in assessment.  Pt is accompanied by her husband, Herbie Baltimore, who pt requests and gives verbal consent to remain throughout the assessment. Pt is assessed face-to-face by nurse practitioner.   Ashley Suarez, 36 y.o., female patient seen face to face by this provider, and chart reviewed on 05/04/22.  On evaluation, when asked reason for presenting today, pt states "Since Saturday, been having some issues with frustration and anger". Pt also endorses she has been experiencing passive suicidal ideation, which she describes as thoughts such as "I shouldn't be here, not worthy, can't do anything right". She states she did have fleeting thoughts about slitting her throat, although denies that it was a plan or she had any intent to do it. She adamantly denies having any plan or intent to hurt or kill herself. She states "I don't want to harm myself, I don't want to die".  When asked about any identifiable triggers, pt reports she is not sure if there were any. She does note that she missed her therapy session last Thursday to attend a primary care appointment, and that she was looking on Facebook and had memories of her mother (mother's birthday is on May 17, 2024, mother passed away 7 years ago).   Pt reports depressed, anxious mood. She reports improved appetite since stopping alcohol 60 days ago. She reports fair sleep, sleeping 8 hours/night.  Pt denies homicidal or violent ideation. She denies auditory visual hallucinations or paranoia.  She reports history of non suicidal self injurious  behavior, cutting, last occurring in her early 75s. She reports history of 2 suicide attempts when she was in [place] (overdose on pills, cutting). Pt endorses past psychiatric diagnoses of ptsd, bipolar II, and borderline personality disorder.  Pt endorses smoking 1/2 pack of cigarettes/day. She denies alcohol (sober for 60 days), cocaine, marijuana, opioid, other substance use.  Pt reports she is connected with medication management with Dr. Nelida Gores. She has had 1 session with him so far. She reports she was supposed to see him earlier this month, however, appointment was rescheduled as he is currently on medical leave. Her next session with Dr. Nelida Gores is in February. She states she is connected with counseling at [facility]. She has been working with her therapist since February 2023. She has weekly sessions on Thursday. Her next appointment is on Thursday.   Pt and her husband state there is a firearm in the home. Pt does have access. Pt and her husband deny safety concerns with pt access to firearm. Discussed securing firearm, preventing access to firearm, locking the ammunition separately from the firearm, preventing access to ammunition for the time being. Pt and her husband agree to do this.   Pt is living with her husband and her 71 y/o son. She reports her husband is a support for her and is her "safe place".  Pt is working at [employer].  Discussed with pt possible referral to PHP/IOP program. Pt declined stating she does not like group therapy. Discussed with pt admission to continuous assessment or facility based crisis. Pt declined. Pt is adamant she can keep herself safe. Pt's husband denies safety concerns  with discharge today. He states he can continue to support pt and keep her safe. Discussed speaking with therapist about increasing frequency of therapy sessions, possibly to twice/week. Discussed following up with outpatient medication management. Pt interested  in possibly changing medication management providers. Reviewed resources available to pt. Pt discharged with her husband.   [facility] from 05/04/2022 in [facility] Office Visit from 03/16/2022 in [facility] Office Visit from 03/01/2022 in [place] RISK CATEGORY Low Risk No Risk Low Risk       Psychiatric Specialty Exam  Presentation  General Appearance:Appropriate for Environment; Casual  Eye Contact:Fair  Speech:Clear and Coherent; Normal Rate  Speech Volume:Normal  Handedness:Right   Mood and Affect  Mood:Depressed; Anxious  Affect:Tearful; Appropriate   Thought Process  Thought Processes:Coherent; Goal Directed; Linear  Descriptions of Associations:Intact  Orientation:Full (Time, Place and Person)  Thought Content:Logical  Diagnosis of Schizophrenia or Schizoaffective disorder in past: No   Hallucinations:None  Ideas of Reference:None  Suicidal Thoughts:Yes, Passive  Homicidal Thoughts:No   Sensorium  Memory:Immediate Good; Recent Good; Remote Good  Judgment:Good  Insight:Good   Executive Functions  Concentration:Good  Attention Span:Good  [place]  Language:Good   Psychomotor Activity  Psychomotor Activity:Normal   Assets  Assets:Communication Skills; Desire for Improvement; Financial Resources/Insurance; Housing; Intimacy; Leisure Time; Physical Health; Resilience; Social Support; Transportation; Vocational/Educational   Sleep  Sleep:Fair  Number of hours: 8   No data recorded  Physical Exam: Physical Exam Constitutional:      General: She is not in acute distress.    Appearance: She is not ill-appearing, toxic-appearing or diaphoretic.  Eyes:     General: No scleral icterus. Cardiovascular:     Rate and Rhythm: Normal rate.  Pulmonary:     Effort: Pulmonary effort is normal. No  respiratory distress.  Neurological:     Mental Status: She is alert and oriented to person, place, and time.  Psychiatric:        Attention and Perception: Attention and perception normal.        Mood and Affect: Mood is anxious and depressed. Affect is tearful.        Speech: Speech normal.        Behavior: Behavior normal. Behavior is cooperative.        Thought Content: Thought content includes suicidal ideation.        Cognition and Memory: Cognition and memory normal.        Judgment: Judgment normal.    Review of Systems  Constitutional:  Negative for chills and fever.  Respiratory:  Negative for shortness of breath.   Cardiovascular:  Negative for chest pain and palpitations.  Gastrointestinal:  Negative for abdominal pain.  Neurological:  Negative for headaches.  Psychiatric/Behavioral:  Positive for depression and suicidal ideas. The patient is nervous/anxious.    Blood pressure (!) 129/103, pulse 88, temperature 98.5 F (36.9 C), temperature source Oral, resp. rate 18, SpO2 100 %. There is no height or weight on file to calculate BMI.  Musculoskeletal: Strength & Muscle Tone: within normal limits Gait & Station: normal Patient leans: N/A  [place] [facility] Discharge Disposition for Follow up and Recommendations: Based on my evaluation the patient does not appear to have an emergency medical condition and can be discharged with resources and follow up care in outpatient services for Medication Management and Individual Therapy  Tharon Aquas, NP 05/04/2022, 3:06 PM

## 2022-05-04 NOTE — Discharge Instructions (Addendum)

## 2022-05-05 ENCOUNTER — Other Ambulatory Visit: Payer: Self-pay

## 2022-05-05 ENCOUNTER — Telehealth (HOSPITAL_COMMUNITY): Payer: Self-pay | Admitting: Psychiatry

## 2022-05-06 ENCOUNTER — Other Ambulatory Visit: Payer: No Typology Code available for payment source

## 2022-05-06 DIAGNOSIS — E559 Vitamin D deficiency, unspecified: Secondary | ICD-10-CM

## 2022-05-07 ENCOUNTER — Other Ambulatory Visit: Payer: Self-pay

## 2022-05-07 ENCOUNTER — Other Ambulatory Visit: Payer: Self-pay | Admitting: Physician Assistant

## 2022-05-07 DIAGNOSIS — E559 Vitamin D deficiency, unspecified: Secondary | ICD-10-CM

## 2022-05-07 LAB — CBC WITH DIFFERENTIAL/PLATELET
Basophils Absolute: 0 10*3/uL (ref 0.0–0.2)
Basos: 0 %
EOS (ABSOLUTE): 0.1 10*3/uL (ref 0.0–0.4)
Eos: 1 %
Hematocrit: 40.4 % (ref 34.0–46.6)
Hemoglobin: 13.7 g/dL (ref 11.1–15.9)
Immature Grans (Abs): 0 10*3/uL (ref 0.0–0.1)
Immature Granulocytes: 0 %
Lymphocytes Absolute: 1.7 10*3/uL (ref 0.7–3.1)
Lymphs: 24 %
MCH: 33.3 pg — ABNORMAL HIGH (ref 26.6–33.0)
MCHC: 33.9 g/dL (ref 31.5–35.7)
MCV: 98 fL — ABNORMAL HIGH (ref 79–97)
Monocytes Absolute: 0.3 10*3/uL (ref 0.1–0.9)
Monocytes: 4 %
Neutrophils Absolute: 5.2 10*3/uL (ref 1.4–7.0)
Neutrophils: 71 %
Platelets: 334 10*3/uL (ref 150–450)
RBC: 4.11 x10E6/uL (ref 3.77–5.28)
RDW: 11.6 % — ABNORMAL LOW (ref 11.7–15.4)
WBC: 7.3 10*3/uL (ref 3.4–10.8)

## 2022-05-07 LAB — COMPREHENSIVE METABOLIC PANEL WITH GFR
ALT: 15 IU/L (ref 0–32)
AST: 14 IU/L (ref 0–40)
Albumin/Globulin Ratio: 2.2 (ref 1.2–2.2)
Albumin: 4.6 g/dL (ref 3.9–4.9)
Alkaline Phosphatase: 66 IU/L (ref 44–121)
BUN/Creatinine Ratio: 12 (ref 9–23)
BUN: 10 mg/dL (ref 6–20)
Bilirubin Total: 0.2 mg/dL (ref 0.0–1.2)
CO2: 22 mmol/L (ref 20–29)
Calcium: 9.5 mg/dL (ref 8.7–10.2)
Chloride: 103 mmol/L (ref 96–106)
Creatinine, Ser: 0.83 mg/dL (ref 0.57–1.00)
Globulin, Total: 2.1 g/dL (ref 1.5–4.5)
Glucose: 92 mg/dL (ref 70–99)
Potassium: 4.3 mmol/L (ref 3.5–5.2)
Sodium: 138 mmol/L (ref 134–144)
Total Protein: 6.7 g/dL (ref 6.0–8.5)
eGFR: 92 mL/min/1.73

## 2022-05-07 LAB — TSH: TSH: 2.75 u[IU]/mL (ref 0.450–4.500)

## 2022-05-07 LAB — VITAMIN D 25 HYDROXY (VIT D DEFICIENCY, FRACTURES): Vit D, 25-Hydroxy: 12.1 ng/mL — ABNORMAL LOW (ref 30.0–100.0)

## 2022-05-07 MED ORDER — VITAMIN D (ERGOCALCIFEROL) 1.25 MG (50000 UNIT) PO CAPS
50000.0000 [IU] | ORAL_CAPSULE | ORAL | 0 refills | Status: AC
  Filled 2022-05-07: qty 13, 90d supply, fill #0

## 2022-05-20 DIAGNOSIS — F331 Major depressive disorder, recurrent, moderate: Secondary | ICD-10-CM | POA: Diagnosis not present

## 2022-05-23 ENCOUNTER — Other Ambulatory Visit (HOSPITAL_COMMUNITY): Payer: Self-pay | Admitting: Psychiatry

## 2022-05-24 ENCOUNTER — Telehealth (HOSPITAL_BASED_OUTPATIENT_CLINIC_OR_DEPARTMENT_OTHER): Payer: 59 | Admitting: Psychiatry

## 2022-05-24 ENCOUNTER — Other Ambulatory Visit: Payer: Self-pay

## 2022-05-24 DIAGNOSIS — F609 Personality disorder, unspecified: Secondary | ICD-10-CM | POA: Diagnosis not present

## 2022-05-24 DIAGNOSIS — F1021 Alcohol dependence, in remission: Secondary | ICD-10-CM

## 2022-05-24 DIAGNOSIS — F1411 Cocaine abuse, in remission: Secondary | ICD-10-CM

## 2022-05-24 DIAGNOSIS — F319 Bipolar disorder, unspecified: Secondary | ICD-10-CM

## 2022-05-24 MED ORDER — PROPRANOLOL HCL 10 MG PO TABS
10.0000 mg | ORAL_TABLET | Freq: Two times a day (BID) | ORAL | 1 refills | Status: DC | PRN
  Filled 2022-05-24: qty 60, 30d supply, fill #0

## 2022-05-24 MED ORDER — QUETIAPINE FUMARATE ER 50 MG PO TB24
50.0000 mg | ORAL_TABLET | Freq: Two times a day (BID) | ORAL | 2 refills | Status: DC
  Filled 2022-05-24: qty 60, 30d supply, fill #0
  Filled 2022-06-28: qty 60, 30d supply, fill #1
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
## 2022-05-25 ENCOUNTER — Other Ambulatory Visit: Payer: Self-pay

## 2022-05-27 DIAGNOSIS — F331 Major depressive disorder, recurrent, moderate: Secondary | ICD-10-CM | POA: Diagnosis not present

## 2022-06-02 ENCOUNTER — Ambulatory Visit (INDEPENDENT_AMBULATORY_CARE_PROVIDER_SITE_OTHER): Payer: 59 | Admitting: Psychology

## 2022-06-02 DIAGNOSIS — F89 Unspecified disorder of psychological development: Secondary | ICD-10-CM | POA: Diagnosis not present
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
## 2022-06-03 ENCOUNTER — Other Ambulatory Visit: Payer: Self-pay

## 2022-06-03 DIAGNOSIS — F331 Major depressive disorder, recurrent, moderate: Secondary | ICD-10-CM | POA: Diagnosis not present

## 2022-06-06 DIAGNOSIS — N812 Incomplete uterovaginal prolapse: Secondary | ICD-10-CM | POA: Insufficient documentation

## 2022-06-06 DIAGNOSIS — N8110 Cystocele, unspecified: Secondary | ICD-10-CM | POA: Insufficient documentation

## 2022-06-06 DIAGNOSIS — N816 Rectocele: Secondary | ICD-10-CM | POA: Insufficient documentation

## 2022-06-06 NOTE — Progress Notes (Signed)
PCP:  [name], PA-C   Chief Complaint  Patient presents with   Gynecologic Exam    No concerns     HPI:      [name] is a 37 y.o. G1P1001 whose LMP was No LMP recorded. (Menstrual status: IUD)., presents today for her annual examination.  Her menses are absent with IUD. No dysmen, no BTB. Diagnosed with Grade 2 cystocele, rectocele and uterine prolapse on 12/23 exam with Dr. Hulan Fray. Plans to start pelvic PT. No GI sx, has urinary frequency but drinks 3 cups caffeine daily and lots of water.   Sex activity: single partner, contraception - Mirena Replaced 06/21/18. No pain/bleeding.  Last Pap: 06/05/21 Results were: no abnormalities /neg HPV DNA   There is no FH of breast cancer. There is no FH of ovarian cancer. The patient does do self-breast exams.  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 Was drinking too much so made changes No drug use.  Exercise: moderately active  She does get adequate calcium and Vitamin D in her diet. Labs with PCP.  Hx of Vit D deficiency on 12/23 labs; treated with Rx Vit D for  7 days; due for repeat labs. Not taking OTC supp currently  Patient Active Problem List   Diagnosis Date Noted   Second degree uterine prolapse 06/06/2022   Baden-Walker grade 2 cystocele 06/06/2022   Baden-Walker grade 2 rectocele 06/06/2022   Personality disorder ([place]) 03/01/2022   Significant use of alcohol 03/01/2022   Cocaine use disorder, mild, in sustained remission ([place]) 03/01/2022   Attention deficit hyperactivity disorder (ADHD) 12/23/2021   Primary hypertension 05/07/2021   Class 1 obesity without serious comorbidity with body mass index (BMI) of 30.0 to 30.9 in adult 04/28/2021   Nondisplaced comminuted supracondylar fracture without intercondylar fracture of right humerus with routine healing 05/21/2020   Vitamin D deficiency 04/29/2020   Muscle spasm 04/24/2019   H/O acute pancreatitis 04/24/2019   Abdominal  pain, chronic, epigastric    Anxiety disorder 10/23/2014   Allergic rhinitis 10/23/2014   Bipolar and related disorder ([place]) 10/23/2014   Chronic post-traumatic stress disorder 10/23/2014   Headache, migraine 10/23/2014   Tobacco use disorder 10/23/2014    Past Surgical History:  Procedure Laterality Date   MOUTH SURGERY      Family History  Problem Relation Age of Onset   Anxiety disorder Mother    Depression Mother        major   Drug abuse Mother    Congestive Heart Failure Mother    Bipolar disorder Mother    Carpal tunnel syndrome Mother    Heart disease Mother    Hypertension Father    Diabetes Father        Type 2   Arrhythmia Brother    ADD / ADHD Brother    Drug abuse Brother    CAD Brother    Cancer Maternal Aunt    Cancer Maternal Grandmother    Epilepsy Maternal Grandmother    Cancer Maternal Grandfather    Diabetes Paternal Grandfather        Type 2    Social History   Socioeconomic History   Marital status: Married    Spouse name: robert   Number of children: 2   Years of education: Not on file   Highest education level: Some college, no degree  Occupational History    Comment: [employer]  Tobacco Use   Smoking  status: Every Day    Packs/day: 0.50    Years: 25.00    Total pack years: 12.50    Types: Cigarettes   Smokeless tobacco: Never   Tobacco comments:    Intermittent quitting, longest stretch 2 years    Started smoking at 54   Vaping Use   Vaping Use: Former  Substance and Sexual Activity   Alcohol use: Yes    Alcohol/week: 32.0 standard drinks of alcohol    Types: 20 Glasses of wine, 6 Cans of beer, 6 Shots of liquor per week   Drug use: No    Comment: former, has tried marijuana and ectasy,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    Comment: Mirena  Other Topics Concern   Not on file  Social History Narrative   Not on file   Social Determinants of  Health   Financial Resource Strain: Not on file  Food Insecurity: Not on file  Transportation Needs: Not on file  Physical Activity: Not on file  Stress: Not on file  Social Connections: Not on file  Intimate Partner Violence: Not on file     Current Outpatient Medications:    Blood Pressure Monitoring (BLOOD PRESSURE MONITOR/M CUFF) MISC, Use to check blood pressure as needed and at least once a week to monitor hypertension, Disp: 1 each, Rfl: 0   fluticasone (FLONASE) 50 MCG/ACT nasal spray, PLACE 2 SPRAYS INTO BOTH NOSTRILS DAILY., Disp: 16 g, Rfl: 5   losartan (COZAAR) 50 MG tablet, Take 1 tablet (50 mg total) by mouth daily., Disp: 90 tablet, Rfl: 1   Multiple Vitamin (MULTI-VITAMINS) TABS, Take by mouth., Disp: , Rfl:    omeprazole (PRILOSEC) 20 MG capsule, Take 1 capsule (20 mg total) by mouth daily., Disp: 90 capsule, Rfl: 3   propranolol (INDERAL) 10 MG tablet, Take 1 tablet (10 mg total) by mouth 2 (two) times daily as needed., Disp: 60 tablet, Rfl: 1   QUEtiapine (SEROQUEL XR) 50 MG TB24 24 hr tablet, Take 1 tablet (50 mg total) by mouth 2 (two) times daily., Disp: 60 tablet, Rfl: 2   Vitamin D, Ergocalciferol, (DRISDOL) 1.25 MG (50000 UNIT) CAPS capsule, Take 1 capsule (50,000 Units total) by mouth every 7 (seven) days., Disp: 13 capsule, Rfl: 0   levonorgestrel (MIRENA) 20 MCG/24HR IUD, 1 Intra Uterine Device (1 each total) by Intrauterine route once for 1 dose., Disp: 1 each, Rfl: 0   ondansetron (ZOFRAN) 4 MG tablet, Take 1 tablet (4 mg total) by mouth every 8 (eight) hours as needed. (Patient not taking: Reported on 06/08/2022), Disp: 20 tablet, Rfl: 1     ROS:  Review of Systems  Constitutional:  Negative for fatigue, fever and unexpected weight change.  Respiratory:  Negative for cough, shortness of breath and wheezing.   Cardiovascular:  Negative for chest pain, palpitations and leg swelling.  Gastrointestinal:  Negative for blood in stool, constipation, diarrhea,  nausea and vomiting.  Endocrine: Negative for cold intolerance, heat intolerance and polyuria.  Genitourinary:  Negative for dyspareunia, dysuria, flank pain, frequency, genital sores, hematuria, menstrual problem, pelvic pain, urgency, vaginal bleeding, vaginal discharge and vaginal pain.  Musculoskeletal:  Negative for back pain, joint swelling and myalgias.  Skin:  Negative for rash.  Neurological:  Negative for dizziness, syncope, light-headedness, numbness and headaches.  Hematological:  Negative for adenopathy.  Psychiatric/Behavioral:  Negative for agitation, confusion, dysphoric mood, sleep disturbance and suicidal ideas. The  patient is not nervous/anxious.    BREAST: No symptoms   Objective: Ht '5\' 6"'$  (1.676 m)   Wt 193 lb (87.5 kg)   BMI 31.15 kg/m    Physical Exam Constitutional:      Appearance: She is well-developed.  Genitourinary:     Vulva normal.     Genitourinary Comments: MILD CYSTOCELE/RECTOCELE WITH VALSALVA     Right Labia: No rash, tenderness or lesions.    Left Labia: No tenderness, lesions or rash.    No vaginal discharge, erythema or tenderness.     Anterior and posterior vaginal prolapse present.     Right Adnexa: not tender and no mass present.    Left Adnexa: not tender and no mass present.    No cervical friability or polyp.     IUD strings visualized.     Uterus is not enlarged or tender.  Breasts:    Right: No mass, nipple discharge, skin change or tenderness.     Left: No mass, nipple discharge, skin change or tenderness.  Neck:     Thyroid: No thyromegaly.  Cardiovascular:     Rate and Rhythm: Normal rate and regular rhythm.     Heart sounds: Normal heart sounds. No murmur heard. Pulmonary:     Effort: Pulmonary effort is normal.     Breath sounds: Normal breath sounds.  Abdominal:     Palpations: Abdomen is soft.     Tenderness: There is no abdominal tenderness. There is no guarding or rebound.  Musculoskeletal:        General:  Normal range of motion.     Cervical back: Normal range of motion.  Lymphadenopathy:     Cervical: No cervical adenopathy.  Neurological:     General: No focal deficit present.     Mental Status: She is alert and oriented to person, place, and time.     Cranial Nerves: No cranial nerve deficit.  Skin:    General: Skin is warm and dry.  Psychiatric:        Mood and Affect: Mood normal.        Behavior: Behavior normal.        Thought Content: Thought content normal.        Judgment: Judgment normal.  Vitals reviewed.     Assessment/Plan: Encounter for annual routine gynecological examination  Encounter for routine checking of intrauterine contraceptive device (IUD)--IUD strings in cx os, has 8 yr indication  Vitamin D deficiency - Plan: VITAMIN D 25 Hydroxy (Vit-D Deficiency, Fractures); check labs today.   Second degree uterine prolapse--no other sx, Recommended pelvic exercises till sees PT. Reassurance since no other sx.   Baden-Walker grade 2 cystocele  SUI--d/c caffeine. F/u prn.  Baden-Walker grade 2 rectocele    GYN counsel adequate intake of calcium and vitamin D, diet and exercise, tob cessation     F/U  Return in about 1 year (around 06/09/2023).  Jezebelle Ledwell B. Aveer Bartow, PA-C 06/08/2022 8:20 AM

## 2022-06-08 ENCOUNTER — Encounter: Payer: Self-pay | Admitting: Obstetrics and Gynecology

## 2022-06-08 ENCOUNTER — Ambulatory Visit (INDEPENDENT_AMBULATORY_CARE_PROVIDER_SITE_OTHER): Payer: 59 | Admitting: Obstetrics and Gynecology

## 2022-06-08 VITALS — BP 122/70 | Ht 66.0 in | Wt 193.0 lb

## 2022-06-08 DIAGNOSIS — E559 Vitamin D deficiency, unspecified: Secondary | ICD-10-CM

## 2022-06-08 DIAGNOSIS — N816 Rectocele: Secondary | ICD-10-CM | POA: Diagnosis not present

## 2022-06-08 DIAGNOSIS — Z30431 Encounter for routine checking of intrauterine contraceptive device: Secondary | ICD-10-CM

## 2022-06-08 DIAGNOSIS — Z01411 Encounter for gynecological examination (general) (routine) with abnormal findings: Secondary | ICD-10-CM | POA: Diagnosis not present

## 2022-06-08 DIAGNOSIS — Z01419 Encounter for gynecological examination (general) (routine) without abnormal findings: Secondary | ICD-10-CM

## 2022-06-08 DIAGNOSIS — N8110 Cystocele, unspecified: Secondary | ICD-10-CM

## 2022-06-08 DIAGNOSIS — N812 Incomplete uterovaginal prolapse: Secondary | ICD-10-CM

## 2022-06-08 NOTE — Patient Instructions (Signed)
I value your feedback and you entrusting us with your care. If you get a [facility] patient survey, I would appreciate you taking the time to let us know about your experience today. Thank you! ? ? ?

## 2022-06-09 ENCOUNTER — Encounter: Payer: Self-pay | Admitting: Obstetrics and Gynecology

## 2022-06-09 ENCOUNTER — Ambulatory Visit: Payer: 59 | Admitting: Psychology

## 2022-06-09 DIAGNOSIS — F89 Unspecified disorder of psychological development: Secondary | ICD-10-CM

## 2022-06-09 LAB — VITAMIN D 25 HYDROXY (VIT D DEFICIENCY, FRACTURES): Vit D, 25-Hydroxy: 35.3 ng/mL (ref 30.0–100.0)

## 2022-06-09 NOTE — Progress Notes (Signed)
Date: 06/09/2022   Appointment Start Time: 4:02pm Duration: 110 minutes Provider: Clarice Pole, PsyD Type of Session: Testing Appointment for Evaluation  Location of Patient: Home Location of Provider: Provider's Home (private office) Type of Contact: WebEx video visit with audio  Session Content: Today's appointment was a telepsychological visit due to COVID-19. Ashley Suarez is aware it is her responsibility to secure confidentiality on her end of the session. Prior to proceeding with today's appointment, Ashley Suarez's physical location at the time of this appointment was obtained as well a phone number she could be reached at in the event of technical difficulties. Ashley Suarez denied anyone else being present in the room or on the virtual appointment. This provider reviewed that video should not be captured, photos should not be taken, nor should testing stimuli be copied or recorded as it would be a copyright violation. Ashley Suarez expressed understanding of the aforementioned, and verbally consented to proceed. The WAIS-IV was administered, scored, and interpreted by this evaluator  Billing codes will be input on the feedback appointment. There are no billing codes for the testing appointment.   Provisional DSM-5 diagnosis(es):  F89 Unspecified Disorder of Psychological Development   Plan: Ashley Suarez was scheduled for a feedback appointment on 06/17/2022 at 6pm via WebEx video visit with audio.                Dolores Lory, PsyD

## 2022-06-17 ENCOUNTER — Ambulatory Visit (INDEPENDENT_AMBULATORY_CARE_PROVIDER_SITE_OTHER): Payer: 59 | Admitting: Family Medicine

## 2022-06-17 ENCOUNTER — Telehealth (HOSPITAL_BASED_OUTPATIENT_CLINIC_OR_DEPARTMENT_OTHER): Payer: 59 | Admitting: Psychiatry

## 2022-06-17 ENCOUNTER — Encounter: Payer: Self-pay | Admitting: Family Medicine

## 2022-06-17 ENCOUNTER — Ambulatory Visit (INDEPENDENT_AMBULATORY_CARE_PROVIDER_SITE_OTHER): Payer: 59 | Admitting: Psychology

## 2022-06-17 ENCOUNTER — Encounter (HOSPITAL_COMMUNITY): Payer: Self-pay | Admitting: Psychiatry

## 2022-06-17 ENCOUNTER — Other Ambulatory Visit: Payer: Self-pay | Admitting: Family Medicine

## 2022-06-17 VITALS — BP 118/80 | HR 105 | Temp 98.5°F | Ht 66.0 in | Wt 194.0 lb

## 2022-06-17 DIAGNOSIS — R5383 Other fatigue: Secondary | ICD-10-CM | POA: Diagnosis not present

## 2022-06-17 DIAGNOSIS — F319 Bipolar disorder, unspecified: Secondary | ICD-10-CM | POA: Diagnosis not present

## 2022-06-17 DIAGNOSIS — F1021 Alcohol dependence, in remission: Secondary | ICD-10-CM

## 2022-06-17 DIAGNOSIS — J069 Acute upper respiratory infection, unspecified: Secondary | ICD-10-CM

## 2022-06-17 DIAGNOSIS — F4310 Post-traumatic stress disorder, unspecified: Secondary | ICD-10-CM

## 2022-06-17 DIAGNOSIS — F902 Attention-deficit hyperactivity disorder, combined type: Secondary | ICD-10-CM

## 2022-06-17 DIAGNOSIS — R0609 Other forms of dyspnea: Secondary | ICD-10-CM

## 2022-06-17 DIAGNOSIS — F609 Personality disorder, unspecified: Secondary | ICD-10-CM

## 2022-06-17 DIAGNOSIS — F331 Major depressive disorder, recurrent, moderate: Secondary | ICD-10-CM | POA: Diagnosis not present

## 2022-06-17 DIAGNOSIS — F1411 Cocaine abuse, in remission: Secondary | ICD-10-CM | POA: Diagnosis not present

## 2022-06-17 NOTE — Progress Notes (Signed)
I,[name],acting as a Education administrator for Gwyneth Sprout, FNP.,have documented all relevant documentation on the behalf of Gwyneth Sprout, FNP,as directed by  Gwyneth Sprout, FNP while in the presence of Gwyneth Sprout, FNP.   Established patient visit   Patient: Ashley Suarez   DOB: 1986/02/05   37 y.o. Female  MRN: [id number] Visit Date: 06/17/2022  Today's healthcare provider: Gwyneth Sprout, FNP  Introduced to nurse practitioner role and practice setting.  All questions answered.  Discussed provider/patient relationship and expectations.   Chief Complaint  Patient presents with   Fatigue   Subjective    HPI HPI   Pt stated--fatigue, both shoulder pain--4 days COVID test 2x --negative  Pt stated--check A1c-13.8, sugar 90.  Last edited by Elta Guadeloupe, CMA on 06/17/2022 11:17 AM.      Fatigue  She reports new onset fatigue which she describes as feeling exhausted and feeling sleepy. It began a few days ago and occurs all the time and every day. It is described as severe and gradually worsening. She has not started new medications around the time the fatigue started.   Associated symptoms: Yes arthralgias No bleeding  No melena No chest discomfort  No heart palpitations No heart racing   Yes dyspnea No feeling depressed  Yes feeling anxious or under stress No fevers  No loss of appetite No nausea  No vomiting Yes sleeping problems    Wt Readings from Last 3 Encounters:  06/17/22 194 lb (88 kg)  06/08/22 193 lb (87.5 kg)  04/29/22 193 lb 14.4 oz (88 kg)    Lab Results  Component Value Date   WBC 9.6 06/17/2022   HGB 13.5 06/17/2022   HCT 38.2 06/17/2022   MCV 95 06/17/2022   PLT 322 06/17/2022   Lab Results  Component Value Date   TSH 2.750 05/06/2022   Lab Results  Component Value Date   NA 138 06/17/2022   K 3.9 06/17/2022   CO2 21 06/17/2022   BUN 8 06/17/2022   CREATININE 0.71 06/17/2022   CALCIUM 9.5 06/17/2022   GLUCOSE 86 06/17/2022      ---------------------------------------------------------------------------------------------------   Medications: Outpatient Medications Prior to Visit  Medication Sig   Blood Pressure Monitoring (BLOOD PRESSURE MONITOR/M CUFF) MISC Use to check blood pressure as needed and at least once a week to monitor hypertension   fluticasone (FLONASE) 50 MCG/ACT nasal spray PLACE 2 SPRAYS INTO BOTH NOSTRILS DAILY.   losartan (COZAAR) 50 MG tablet Take 1 tablet (50 mg total) by mouth daily.   Multiple Vitamin (MULTI-VITAMINS) TABS Take by mouth.   omeprazole (PRILOSEC) 20 MG capsule Take 1 capsule (20 mg total) by mouth daily.   ondansetron (ZOFRAN) 4 MG tablet Take 1 tablet (4 mg total) by mouth every 8 (eight) hours as needed.   propranolol (INDERAL) 10 MG tablet Take 1 tablet (10 mg total) by mouth 2 (two) times daily as needed.   QUEtiapine (SEROQUEL XR) 50 MG TB24 24 hr tablet Take 1 tablet (50 mg total) by mouth 2 (two) times daily.   Vitamin D, Ergocalciferol, (DRISDOL) 1.25 MG (50000 UNIT) CAPS capsule Take 1 capsule (50,000 Units total) by mouth every 7 (seven) days.   levonorgestrel (MIRENA) 20 MCG/24HR IUD 1 Intra Uterine Device (1 each total) by Intrauterine route once for 1 dose.   No facility-administered medications prior to visit.    Review of Systems  Last CBC Lab Results  Component Value Date  WBC 9.6 06/17/2022   HGB 13.5 06/17/2022   HCT 38.2 06/17/2022   MCV 95 06/17/2022   MCH 33.5 (H) 06/17/2022   RDW 11.8 06/17/2022   PLT 322 35/57/3220   Last metabolic panel Lab Results  Component Value Date   GLUCOSE 86 06/17/2022   NA 138 06/17/2022   K 3.9 06/17/2022   CL 100 06/17/2022   CO2 21 06/17/2022   BUN 8 06/17/2022   CREATININE 0.71 06/17/2022   EGFR 111 06/17/2022   CALCIUM 9.5 06/17/2022   PROT 6.7 05/06/2022   ALBUMIN 4.6 05/06/2022   LABGLOB 2.1 05/06/2022   AGRATIO 2.2 05/06/2022   BILITOT 0.2 05/06/2022   ALKPHOS 66 05/06/2022   AST 14  05/06/2022   ALT 15 05/06/2022   ANIONGAP 4 (L) 03/04/2017   Last lipids Lab Results  Component Value Date   CHOL 182 06/05/2021   HDL 54 06/05/2021   LDLCALC 112 (H) 06/05/2021   TRIG 86 06/05/2021   CHOLHDL 3.4 06/05/2021   Last hemoglobin A1c Lab Results  Component Value Date   HGBA1C 5.3 06/05/2021   Last thyroid functions Lab Results  Component Value Date   TSH 2.750 05/06/2022   T4TOTAL 6.1 04/13/2017   Last vitamin D Lab Results  Component Value Date   VD25OH 35.3 06/08/2022     Objective    BP 118/80 (BP Location: Right Arm, Patient Position: Sitting, Cuff Size: Normal)   Pulse (!) 105   Temp 98.5 F (36.9 C)   Ht '5\' 6"'$  (1.676 m)   Wt 194 lb (88 kg)   SpO2 100%   BMI 31.31 kg/m   BP Readings from Last 3 Encounters:  06/17/22 118/80  06/08/22 122/70  04/29/22 124/87   Wt Readings from Last 3 Encounters:  06/17/22 194 lb (88 kg)  06/08/22 193 lb (87.5 kg)  04/29/22 193 lb 14.4 oz (88 kg)   SpO2 Readings from Last 3 Encounters:  06/17/22 100%  04/29/22 100%  03/02/22 100%      Physical Exam Vitals and nursing note reviewed.  Constitutional:      General: She is awake. She is not in acute distress.    Appearance: Normal appearance. She is well-developed and well-groomed. She is obese. She is not ill-appearing, toxic-appearing or diaphoretic.  HENT:     Head: Normocephalic and atraumatic.     Jaw: There is normal jaw occlusion. No trismus, tenderness, swelling or pain on movement.     Right Ear: Hearing, tympanic membrane, ear canal and external ear normal. There is no impacted cerumen.     Left Ear: Hearing, tympanic membrane, ear canal and external ear normal. There is no impacted cerumen.     Nose: Congestion present. No rhinorrhea.     Right Turbinates: Not enlarged, swollen or pale.     Left Turbinates: Not enlarged, swollen or pale.     Right Sinus: No maxillary sinus tenderness or frontal sinus tenderness.     Left Sinus: No maxillary  sinus tenderness or frontal sinus tenderness.     Mouth/Throat:     Lips: Pink.     Mouth: Mucous membranes are moist. No injury.     Tongue: No lesions.     Pharynx: Oropharynx is clear. Uvula midline. No pharyngeal swelling, oropharyngeal exudate, posterior oropharyngeal erythema or uvula swelling.     Tonsils: No tonsillar exudate or tonsillar abscesses.  Eyes:     General: Lids are normal. Lids are everted, no foreign bodies appreciated. Vision  grossly intact. Gaze aligned appropriately. No allergic shiner or visual field deficit.       Right eye: No discharge.        Left eye: No discharge.     Extraocular Movements: Extraocular movements intact.     Conjunctiva/sclera: Conjunctivae normal.     Right eye: Right conjunctiva is not injected. No exudate.    Left eye: Left conjunctiva is not injected. No exudate.    Pupils: Pupils are equal, round, and reactive to light.  Neck:     Thyroid: No thyroid mass, thyromegaly or thyroid tenderness.     Vascular: No carotid bruit.     Trachea: Trachea normal.  Cardiovascular:     Rate and Rhythm: Normal rate and regular rhythm.     Pulses: Normal pulses.          Carotid pulses are 2+ on the right side and 2+ on the left side.      Radial pulses are 2+ on the right side and 2+ on the left side.       Dorsalis pedis pulses are 2+ on the right side and 2+ on the left side.       Posterior tibial pulses are 2+ on the right side and 2+ on the left side.     Heart sounds: Normal heart sounds, S1 normal and S2 normal. No murmur heard.    No friction rub. No gallop.  Pulmonary:     Effort: Pulmonary effort is normal. No respiratory distress.     Breath sounds: Normal breath sounds and air entry. No stridor. No wheezing, rhonchi or rales.  Chest:     Chest wall: No tenderness.  Abdominal:     General: Abdomen is flat. Bowel sounds are normal. There is no distension.     Palpations: Abdomen is soft. There is no mass.     Tenderness: There is no  abdominal tenderness. There is no right CVA tenderness, left CVA tenderness, guarding or rebound.     Hernia: No hernia is present.  Genitourinary:    Comments: Exam deferred; denies complaints Musculoskeletal:        General: No swelling, tenderness, deformity or signs of injury. Normal range of motion.     Cervical back: Full passive range of motion without pain, normal range of motion and neck supple. No edema, rigidity or tenderness. No muscular tenderness.     Right lower leg: No edema.     Left lower leg: No edema.  Lymphadenopathy:     Cervical: No cervical adenopathy.     Right cervical: No superficial, deep or posterior cervical adenopathy.    Left cervical: No superficial, deep or posterior cervical adenopathy.  Skin:    General: Skin is warm and dry.     Capillary Refill: Capillary refill takes less than 2 seconds.     Coloration: Skin is not jaundiced or pale.     Findings: No bruising, erythema, lesion or rash.  Neurological:     General: No focal deficit present.     Mental Status: She is alert and oriented to person, place, and time. Mental status is at baseline.     GCS: GCS eye subscore is 4. GCS verbal subscore is 5. GCS motor subscore is 6.     Sensory: Sensation is intact. No sensory deficit.     Motor: Motor function is intact. No weakness.     Coordination: Coordination is intact. Coordination normal.     Gait: Gait is intact.  Gait normal.  Psychiatric:        Attention and Perception: Attention and perception normal.        Mood and Affect: Mood and affect normal.        Speech: Speech normal.        Behavior: Behavior normal. Behavior is cooperative.        Thought Content: Thought content normal.        Cognition and Memory: Cognition and memory normal.        Judgment: Judgment normal.       Results for orders placed or performed in visit on 06/17/22  CBC  Result Value Ref Range   WBC 9.6 3.4 - 10.8 x10E3/uL   RBC 4.03 3.77 - 5.28 x10E6/uL    Hemoglobin 13.5 11.1 - 15.9 g/dL   Hematocrit 38.2 34.0 - 46.6 %   MCV 95 79 - 97 fL   MCH 33.5 (H) 26.6 - 33.0 pg   MCHC 35.3 31.5 - 35.7 g/dL   RDW 11.8 11.7 - 15.4 %   Platelets 322 150 - 450 [id number]  Basic Metabolic Panel (BMET)  Result Value Ref Range   Glucose 86 70 - 99 mg/dL   BUN 8 6 - 20 mg/dL   Creatinine, Ser 0.71 0.57 - 1.00 mg/dL   eGFR 111 >59 mL/min/1.73   BUN/Creatinine Ratio 11 9 - 23   Sodium 138 134 - 144 mmol/L   Potassium 3.9 3.5 - 5.2 mmol/L   Chloride 100 96 - 106 mmol/L   CO2 21 20 - 29 mmol/L   Calcium 9.5 8.7 - 10.2 mg/dL  Sed Rate (ESR)  Result Value Ref Range   Sed Rate 8 0 - 32 mm/hr  C-reactive protein  Result Value Ref Range   CRP 4 0 - 10 mg/L  POC COVID-19  Result Value Ref Range   SARS Coronavirus 2 Ag Negative Negative  POCT Influenza A/B  Result Value Ref Range   Influenza A, POC Negative Negative   Influenza B, POC Negative Negative  POCT rapid strep A  Result Value Ref Range   Rapid Strep A Screen Negative Negative    Assessment & Plan     Problem List Items Addressed This Visit       Respiratory   Viral upper respiratory tract infection    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 Negative POC for strep, COVID and flu. Endorses fatigue and malaise. Recommend baseline labs and supportive care. Work note provided x24 hours to assist.       Relevant Orders   CBC (Completed)   Basic Metabolic Panel (BMET) (Completed)   Sed Rate (ESR) (Completed)   C-reactive protein (Completed)   POC COVID-19 (Completed)   POCT Influenza A/B (Completed)   POCT rapid strep A (Completed)     Other   Fatigue - Primary    Acute, unknown Reports feeling exhausted and tired Symptoms were gradual Symptoms have worsened Works in healthcare; however, no known sick contacts or changes in [place] [facility] exam         Return if symptoms worsen or fail to improve.      Vonna Kotyk, FNP, have reviewed all  documentation for this visit. The documentation on 06/21/22 for the exam, diagnosis, procedures, and orders are all accurate and complete.    Gwyneth Sprout, [place] [phone] (phone) [phone] (fax)  [place]
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
## 2022-06-17 NOTE — Progress Notes (Signed)
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 to abstract meaningful concepts and relationships from verbally presented material. Her lowest performance was on the Information subtest which is primarily a measure of her fund  of general knowledge but may also be influenced by cultural experience, quality of education, and ability to retrieve information from long-term memory. It is important to note that Ms. Purkey verbalized and demonstrated self-expression and long-term memory retrieval issues that negatively impacted her performance on various verbal comprehension tasks.  The Working Memory Index ([place]) provides a measure of one's ability to sustain attention, concentrate, and exert mental control. Ms. Spindler obtained a WMI scaled score of 71 (3rd percentile), placing her in the borderline range compared to same-aged peers. Ms. Bowler demonstrated similar performance on the subtests comprising this index. The 16-point difference between her VCI and WMI scores is statistically significant at the .05 level and suggests her abilities to sustain attention, concentrate, and exert mental control are a weakness relative to her verbal reasoning abilities.   ATTENTION AND PROCESSING  CNS Vital Signs: The CNS Vital Signs assessment evaluates the neurocognitive status of an individual and covers a range of mental processes. The results of the CNS Vital Signs testing indicated very low neurocognitive  processing ability, although her results were deemed possibly invalid. Regarding attention, only sustained attention was in the average range. Simple attention was low and complex attention was very low but deemed possibly invalid. Cognitive flexibility and executive function were in the very low range but deemed possibly invalid. Psychomotor speed, motor speed, and reaction time were very low, which suggests impaired hand-eye coordination and responsiveness. Processing speed was low average. Working memory was average. Visual memory (images) was very low and verbal memory (words) was average, which indicates verbal memory is a relative strength and visual memory is impaired. The results suggest Ms. Heidrick experiences visual memory, psychomotor speed, reaction time, complex attention, cognitive flexibility, executive function, simple attention, and motor speed impairment; a weakness in processing speed; and relative strengths in verbal memory, working memory, and sustained attention, although complex attention, cognitive flexibility, and executive function were deemed possibly invalid. Upon follow-up, Ms. Fowle stated she "measured up a couple items on the Shifting Attention Test" as she would forget what she was tracking and would not answer quick enough. As a result, the Shifting Attention Test portion was re-administered. Upon re-administration, her results on this task were deemed valid. She demonstrated an improvement on the measure, although her results continued to fall into the very low range.  Domain  Standard Score Percentile Validity Indicator Guideline  Neurocognitive Index 28 1 No Very Low  Composite Memory 73 4 Yes Low  Verbal Memory 93 32 Yes Average  Visual Memory 62 1 Yes Very Low  Psychomotor Speed 57 1 Yes Very Low  Reaction Time  55 1 Yes Very Low  Complex Attention -23 1 No Very Low  Cognitive Flexibility -20 1 No Very Low  Processing Speed  84 14 Yes Low Average  Executive  Function -14 1 No Very Low  Executive Function Re-Administration 50 1 Yes Very Low  Working Memory 96 40 Yes Average  Sustained Attention 99 47 Yes Average  Simple Attention 76 5 Yes Low  Motor Speed 57 1 Yes Very Low   EXECUTIVE FUNCTION  Behavior Rating Inventory of Executive Function, Second Edition (BRIEF-A) Self-Report:  Ms. Andersson completed the Self-Report Form of the Behavior Rating Inventory of Executive Function-Adult Version (BRIEF-A), which has three domains that evaluate cognitive, behavioral, and emotional regulation, and a Global Executive Composite score provides an overall snapshot of executive functioning. There are no missing item responses in the protocol.  The Negativity, Infrequency, and Inconsistency scales are not  elevated, suggesting she did not respond to the protocol in an overly negative, haphazard, extreme, or inconsistent manner. In the context of these validity considerations, ratings of Ms. Prosperi's everyday executive function suggest some areas of concern. The overall index, the Global Executive Composite (GEC), was elevated (GEC T = 77, %ile = 99). Both the Behavioral Regulation (BRI) and the Metacognition (MI) Indexes were elevated (BRI T = 76, %ile = 99 and MI T = 73, %ile = 97). Ms. Donegan indicated difficultly with her ability to inhibit impulsive responses, adjust to changes in routine or task demands, modulate emotions, monitor social behavior, initiate problem solving or activity, sustain working memory, and attend to task-oriented output. She did not describe her ability to plan and organize problem-solving approaches and organize environment and materials as problematic. Her profile suggests significant problem-solving rigidity combined with emotional dysregulation. Individuals with this profile tend to lose emotional control when their routines or perspectives are challenged and/or flexibility is required. Moreover, her elevated scores on the Inhibit scale as  well as the Behavioral Regulation and the Metacognition Indexes, suggest she is perceived as having poor inhibitory control and/or suggest more global behavioral dysregulation is having a negative effect on active metacognitive problem solving.  Scale/Index  Raw Score T Score Percentile  Inhibit 19 74 98  Shift 16 81 >99  Emotional Control 22 65 91  Self-Monitor 14 72 99  Behavioral Regulation Index (BRI) 71 76 99  Initiate 18 69 96  Working Memory 22 86 >99  Plan/Organize 19 62 86  Task Monitor 15 77 >99  Organization of Materials 17 61 85  Metacognition Index (MI) 91 73 97  Global Executive Composite (GEC) 162 77 99   Validity Scale Raw Score Cumulative Percentile Protocol Classification  Negativity 3 0 - 98.3 Acceptable  Infrequency 0 0 - 97.3 Acceptable  Inconsistency 6 0 - 99.2 Acceptable   Behavior Rating Inventory of Executive Function, Second Edition (BRIEF-A) Informant:  Ms. Towles's spouse, Mr. Verlie Liotta, completed the Informant Form of the Behavior Rating Inventory of Executive Function-Adult Version (BRIEF-A), which is equivalent to the Self-Report version and has three domains that evaluate cognitive, behavioral, and emotional regulation, and a Global Executive Composite score provides an overall snapshot of executive functioning. There are no missing item responses in the protocol.  The Negativity, Infrequency, and Inconsistency scales are not elevated, suggesting he did not respond to the protocol in an overly negative, haphazard, extreme, or inconsistent manner. In the context of these validity considerations, Mr. Mcnelly ratings of Ms. Mennen's everyday executive function no concerns. The overall index, the Global Executive Composite (GEC), was within the non-elevated range for age ([place] T = 45, %ile = 40). The Behavioral Regulation (BRI) and Metacognition (MI) Indexes were within normal limits (BRI T = 48, %ile = 51 and MI T = 43, %ile = 34). None of the individual  BRIEF-A scales were elevated, suggesting Ms. Nash is viewed as having appropriate ability to self-regulate, including the ability to inhibit impulsive responses, adjust to changes in routine or task demands, modulate emotions, monitor social behavior, initiate problem solving or activity, sustain working memory, plan and organize problem-solving approaches, attend to task-oriented output, and organize environment and materials. Upon follow-up, Ms. Kosch expressed a belief Mr. Annunziato responses likely stem from her having "adapted to him" through use of mental health services as well as her home being her "safe haven" and stable, which makes it easier for her to routinely use compensatory strategies; however, she  indicated she still experiences regular forgetfulness and distractibility at home.   Scale/Index  Raw Score T Score Percentile  Inhibit 10 45 39  Shift 11 57 80  Emotional Control 17 52 62  Self-Monitor 7 40 28  Behavioral Regulation Index (BRI) 45 48 51  Initiate 9 39 23  Working Memory 13 55 73  Plan/Organize 12 43 35  Task Monitor 6 38 20  Organization of Materials 11 44 32  Metacognition Index (MI) 66 43 34  Global Executive Composite (GEC) 96 45 40   Validity Scale Raw Score Cumulative Percentile Protocol Classification  Negativity 0 0 - 98.5 Acceptable  Infrequency 1 0 - 93.3 Acceptable  Inconsistency 1 0 - 98.8 Acceptable   BEHAVIORAL FUNCTIONING   Patient Health Questionnaire-9 (PHQ-9): Ms. Mcgaughey completed the PHQ-9, a self-report measure that assesses symptoms of depression. She scored 13/27, which indicates moderate depression.   Generalized Anxiety Disorder-7 (GAD-7): Ms. Vanhandel completed the GAD-7, a self-report measure that assesses symptoms of anxiety. She scored 17/21, which indicates severe anxiety.   Adult ADHD Self-Report Scale Symptom Checklist (ASRS): Ms. Platts reported the following symptoms as sometimes: difficulty wrapping up final details of a  project following the completion of challenging aspects, problems remembering appointments or obligations, avoiding or delaying getting started on tasks requiring a lot of thought, making careless mistakes when working on boring or difficult projects, struggling to concentrate on what people say even when they are speaking directly to her, difficulty waiting for turn in turn taking situations, and interrupting others when they are busy. She endorsed the following symptoms as occurring often: difficulty getting things in order when a task requires organization, feeling overly active and compelled to do things, struggling to sustain attention when doing boring or repetitive work, misplacing or has difficulty finding things, leaving her seat when expected to stay seated, and interrupting others or finishing their sentences. She endorsed the following symptoms as very often: fidgeting or squirming, being distracted by noise around her, feeling restless or fidgety, and difficulty relaxing. Endorsement of at least four items in Part A is highly consistent with ADHD in adults. The frequency scores of Part B provides additional cues. Ms. Lueth scored a 5/6 on Part A and 9/12 on Part B, which is considered a positive screening for ADHD.   Adult OCD Inventory (OCD-A) SF-20: The OCD-A SF-20 was administered. Ms. Briski scored a 55/300, which indicated OCD-related concerns are not a problem. She endorsed the following as "Yes, this stops me a little or wastes a little of my time": spending more time than needed to check her work, worrying about being clean, washing her hands over and over, trouble making up her mind, arranging things in certain ways or symmetrically, getting angry if other people mess up her desk or work area, checking things several times, and being unwilling to touch something someone else has touched. She endorsed the following as "Yes, this stops me from doing other things or wastes some of my time":  feeling guilty over minor infractions. She did not endorse and concerns as "Yes, this stops me from doing a lot of things and wastes a lot of my time."   Mood Disorder Questionnaire (MDQ): The MDQ is a self-report measure of bipolar and related disorder symptomatology. Ms. Drummer endorsed 12/13 symptoms and noted several have occurred during the same time periods, but they have only caused minor problems which is a negative screening for bipolar disorder; however, it is unclear if she was considering  periods in which psychotropic medication reduced the negative impacts of endorsed symptomatology. Upon follow-up, she shared she was also considering times in which she was managing bipolar disorder-related symptoms through use of psychotropic medication and mental health services. She noted having been told she has manic-depressive illness or bipolar disorder by a health professional.   PTSD Checklist for DSM-5 (PCL-5): The PCL-5 was administered. Ms. Wooton scored a 42/80 and indicated meeting full criteria for PTSD. She endorsed repeated, disturbing, and unwanted memories of the stressful experience (quite a bit); repeated, disturbing dreams of the stressful experience (moderately); flashbacks (quite a bit); feeling very upset when something reminds you of the stressful experience (moderately); having strong physical reactions when something reminds you of the stressful experience (quite a bit); avoiding memories, thoughts, or feelings related to the stressful experience (quite a bit); avoiding external reminders of the stressful experience (quite a bit); trouble remembering important parts of the stressful experience (moderately); having strong negative beliefs about yourself, other people, or the world (a little bit); blaming yourself or someone else for the stressful experience or what happened after it (moderately); having strong negative feelings such as fear, horror, anger, guilt, or shame (quite a bit);  loss of interest in activities you used to enjoy (quite a bit); feeling distant or cut off from other people (quite a bit); trouble experiencing positive feelings (quite a bit); irritable behavior, angry outbursts, or acting aggressively (quite a bit); taking too many risks or doing things that could cause you harm (a little bit); being superalert or watchful or on guard (extremely); feeling jumpy or easily startled (quite a bit); having difficulty concentrating (quite a bit); and trouble falling or staying asleep (extremely).   Personality Assessment Inventory (PAI): The PAI is an objective inventory of adult personality. The validity indicators suggested Ms. Washburn attended to item content appropriately (INF T = 59) and did not attempt to portray herself in an overly negative (NIM T = 62) or unrealistically favorable manner (PIM T = 41); however, she responded somewhat inconsistently to similar items (ICN T = 64). As a result, her results should be reviewed with caution. She is endorsing concerns and preoccupation with her health functioning (SOM T = 68 and SOM-H T = 64), which may involve sensory or motor dysfunctions (SOM-C T = 72) and frequent occurrence of various common physical symptoms (SOM-S T = 62); significant anxiety and tension (ANX T = 74 and BOR-A T = 63) as well as unhappiness at least part of the time (DEP T = 69 and DEP-A T = 64) that includes ruminative worry and concern as well as thoughts of inadequacy that impairs attention (ANX-C T = 73 and DEP-C T = 61), difficulty relaxing and fatigue (ANX-A T = 68 and DEP-P T = 72), overt physical signs of tension (e.g., sweaty palms, trembling hands, complaints of irregular heartbeat, and/or shortness of breath; ANX-P T = 75), vegetative signs of depression (e.g., sleep disturbance, changes in appetite, and fatigue; DEP-P T = 72), periodic thoughts of self-harm (SUI T = 64), and the possibility of specific fears (ARD-P T = 67) that may be at least  partially explained by having experienced a past disturbing event(s) that continues to be a source of distress (ARD-T T = 70); an accelerated activity level that renders her confused and difficult to understand (MAN-A T = 85 and SCZ-T T = 67), being driven (MAN-G T = 61), impulsivity and recklessness in areas with high potential for negative consequences (BOR-S T =  71) which may have at least partially contributed to difficulties with authority and following social convention in the past (ANT-A T = 64), and being prone to impatience and being easily frustrated as well as being easily insulted and responding by holding grudges toward the offending party which may result in verbal and/or physical aggression  (MAN-I T = 60, PAR-P T = 60, PAR-R T = 64, AGG-V T = 12, and AGG-P T = 76); having little interest in the lives of others (12 T = 61); uncertainty about major life issues and difficulty developing and maintaining a sense of purpose (BOR-I T = 80); and drinking and using drugs on a fairly regular basis which may have resulted in some adverse consequences (ALC T = 65 and DRG T = 64). She appears to acknowledge the need to make some changes, has a positive attitude toward the possibility of personal change, and accepts the importance of personal responsibility (RXR T = 40). Upon follow-up, Ms. Mayweather noted physical aggression was a historical problem and that her having little interest in the lives of others is not accurate.     SUMMARY AND CLINICAL IMPRESSIONS: Ms. Tylene Quashie is a 37 year old female who was referred by Ashley Suarez for an evaluation to determine if she currently meets criteria for a diagnosis of Attention-Deficit/Hyperactivity Disorder (ADHD).   Ms. Spates reported she was diagnosed with ADHD in 1992 and 1995 by two different mental health professionals, noting that she utilized Adderall during this time. She further reported "after puberty" other mental health conditions began  occurring which led to ADHD "not being focused on." She shared she is pursuing an ADHD evaluation as Dr. Shea Suarez recommended she do so for "verification" given the extended time since her last evaluation. She also described a history of borderline personality disorder-related symptomatology; depressive episodes; generalized anxiety; hypomania- or mania-related symptomatology; obsessions and compulsions; sleep onset issues and commonly tossing and turning her sleep, adding she has "always" had difficulty falling asleep as her "brain is constantly working;" a remote history of fluctuating appetite that resulted in her alternating between overeating behaviors and not eating enough; problematic alcohol use and a remote history of problematic cocaine use that she sought treatment for; and periods of suicidality. Chart review indicated diagnoses of ADHD, bipolar disorder, PTSD, and possible borderline personality disorder as well as past "cocaine abuse" and recent problematic alcohol use. A copy of a psychological evaluation that was provided by Ms. Radliff stated "Mennie meets the criteria for classification as a learning-disabled student" and one of the recommendations noted ".her parents may wish to pursue additional medical evaluation to address the possible use of stimulant medication to reduce activity level and improve her attention span; However, it is important to note that her attentional difficulties may stem from her learning difficulties and that her academic environment will need to be optimized prior to considering the possibility that she has neurodevelopmental differences in her attentional ability." She described her ADHD-related concerns as "constant" and independent of mood.  During the evaluation, Ms. Wardrop was administered assessments to measure her current cognitive abilities. Her verbal comprehension abilities were in the low average range, and she demonstrated the strongest performance on the  Similarities subtest, which required her to abstract meaningful concepts and relationships from verbally presented material. Her lowest performance was on the Information subtest, a measure of a fund of general knowledge that can be influenced by cultural experience, quality of education, and ability to retrieve information from long-term memory. Her  ability to sustain attention, concentrate, and exert mental control was in the Borderline range which suggests a general weakness in attention, concentration, mental control, and shorter-term auditory memory as well as in arithmetic computational skills. Results of the CNS Vital Signs indicated very low neurocognitive processing ability. She demonstrated visual memory, psychomotor speed, reaction time, complex attention, cognitive flexibility, executive function, simple attention, and motor speed impairment; a weakness in processing speed; and relative strengths in verbal memory, working memory, and sustained attention, although complex attention, cognitive flexibility, and executive function were deemed possibly invalid. Upon follow-up, Ms. Henthorn stated she "measured up a couple items on the Shifting Attention Test" as she would forget what she was tracking and would not answer quick enough. As a result, the Shifting Attention Test portion was re-administered. Upon re-administration, her results on this task were deemed valid and she demonstrated an improvement on the measure, although her score continued to fall into the very low range.   During the clinical interview and on self-report measures, Ms. Lindo endorsed executive functioning impairment and attentional dysregulation, hyperactivity- and impulsivity-related symptoms, and meeting full criteria for ADHD; however, her spouse, Mr. Abbe Bula results indicated she is not experiencing executive functioning issues. Upon follow-up, Ms. Barcellos expressed a belief his responses likely stem from her having  "adapted to him" through use of mental health services as well as her view of her home as being a "safe haven" and stable, which better allows her to routinely use various compensatory strategies, although she noted she still experiences regular forgetfulness and distractibility at home. While invalid and discrepant test results make interpretation difficult, when considering self-reported symptoms; endorsed and/or demonstrated impairment or weaknesses on measures of attention, executive functioning, working memory, Office manager, psychomotor speed, reaction time, and cognitive flexibility; and past mental health providers reportedly having diagnosed her with ADHD and having utilized stimulant medication; and a possible familial history of ADHD, a diagnosis of F90.2 Attention-Deficit/Hyperactivity Disorder, Combined Presentation, Moderate appears warranted. The specifier of "Moderate" was assigned as she endorsed symptoms in excess of what is needed to make the diagnosis and indicated they cause marked impairment in academic (e.g., struggling with larger class sizes and receiving accommodations for task completion), social (e.g., trouble sustaining her attention when others are speaking to her and commonly interrupting), and daily (e.g., regular forgetfulness, task initiation and completion difficulties, and disorganization) functioning. Upon follow-up, Ms. Bohan indicated the severity of her ADHD is generally in the moderate range but can become severe at times.   Ms. Kasperski also endorsed a history of borderline personality disorder-related concerns (e.g., fears of abandonment and trouble trusting others, alternating between idealizing and devaluating others, a history of relational instability, and "lashing out" at others when upset); depressive episodes that she noted generally last 7-10 days; generalized anxiety; hypomania- or mania-related symptomatology (e.g., going three-to-seven days without sleep,  having significantly more energy and "motivation" than usual, and engaging problematic alcohol use); obsessions and compulsions (e.g., doing certain things "in threes" such as re-reading material three times, fidgeting with use of three fingers, and triple checking her work; concerns about germs and becoming distressed if she is "running out of hand sanitizer"; and becoming agitated by "clutter"); sleep onset issues and commonly tossing and turning her sleep, adding she has "always" had difficulty falling asleep as her "brain is constantly working;" a remote history of fluctuating appetite that resulted in her alternating between overeating behaviors and not eating enough; problematic alcohol use and a remote history of problematic cocaine  use that she sought treatment for; periods of suicidality with the most recent being in December of 2023 which involved thoughts of "slitting [her] throat" and her utilizing urgent behavioral health services to ensure her safety. A copy of an evaluation that was provided by Ms. 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 As such, the PHQ-9, GAD-7, OCD-A, MDQ, PCL-5, and PAI were administered. Her results suggested she is experiencing moderate depression and severe anxiety symptomatology, many bipolar-related symptoms, meeting full criteria for PTSD, and borderline personality disorder-related concerns (e.g., identity disturbance, difficulty controlling anger, impulsivity, and a history suicidal behavior) which was similar to information obtained during the clinical interview and chart review. Her OCD-A results were not indicative of meeting full criteria for OCD. Given the aforementioned, diagnoses of F43.10 Posttraumatic Stress Disorder and F31.9 Bipolar and Related Disorder appear warranted. Given the limited scope of this evaluation, it was unable to be determined if full criteria for borderline personality  disorder, sleep-wake disorder, eating disorder, intellectual development disorder, specific learning disorder, and alcohol use disorder are met or if the symptoms are better explained by diagnoses of ADHD, PTSD, and Bipolar and Related Disorder. She would likely benefit from further evaluation of these symptoms to definitively rule in or out the aforementioned mental health and neurodevelopmental concerns. Should any of them be ruled in, it would likely be in addition to her diagnosis of ADHD as she indicated her ADHD-related concerns are consistent and independent of mood, trauma reminders, and prior to problematic substance use as well as she demonstrates various ADHD-related concerns (e.g., habitual fidgeting and inattention) in settings that have reduced cognitive demands as well.   DSM-5 Diagnostic Impressions: F90.2 Attention-Deficit/Hyperactivity Disorder, Combined Presentation, Moderate F43.10 Posttraumatic Stress Disorder F31.9 Bipolar and Related Disorder (per chart review)  RECOMMENDATIONS: Ms. Gregg would likely benefit from making use of strategies for ADHD symptoms:  Setting a timer to complete tasks. Breaking tasks into manageable chunks and spreading them out over longer periods of time with breaks.  Utilizing lists and day calendars to keep track of tasks.  Answering emails daily.  Improving listening skills by asking the speaker to give information in smaller chunks and asking for explanation for clarification as needed. Leaving more than the anticipated time to complete tasks. It may help to keep tasks brief, well within your attention span, and a mix of both high and low interest tasks. Tasks may be gradually increased in length. Practice proactive planning by setting aside time every evening to plan for the next day (e.g., prepare needed materials or pack the car the night before).  Learn how to make an effective and reasonable "to do" list of important tasks and priorities  and always keep it easily accessible. Make additional copies in case it is lost or misplaced. Utilize visual reminders by posting appointments, "to do lists," or schedule in strategic areas at home and at work.  Practice using an appointment book, smart phone or other tech device, or a daily planning calendar, and learn to write down appointments and commitments immediately. Keep notepads or use a portable audio recorder to capture important ideas that would be beneficial to recall later. Learn and practice time management skills. Purchase a programmable alarm watch or set an alarm on smartphone to avoid losing track of time.  Use a color-coded file system, desk and closet organizers, storage boxes, or other organization devices to reduce clutter and improve efficiency and structure.  Implement ways to  become more aware of your actions and to inhibit or adjust them as warranted (e.g., reviewing videos of your actions, consider consequences of obeying or not obeying the rules of various upcoming situations, have a trusted other to discuss plans with and/or provide cues to stop certain behaviors, and make visual cues for rules you would like to follow). Stay flexible and be prepared to change your plans as symptom breakthroughs and crises are likely to occur periodically. Ms. Fern may benefit from mindfulness training to address symptoms of inattention.  Ms. Bollen would likely benefit from a consultation regarding medication for ADHD symptoms.   Individual therapeutic services may assist in processing a diagnosis of ADHD and discussing coping and compensatory strategies. Mental alertness/energy can be raised by increasing exercise; improving sleep; eating a healthy diet; and managing her endorsed mental health concerns and stress. Consulting with a physician regarding any changes to physical regimen is recommended. "Failing at Normal: An ADHD Success Story" by Mellody Drown is a great overview of  ADHD.  Organizations that are a good source of information on ADHD:  Children and Adults with Attention-Deficit/Hyperactivity Disorder (CHADD): chadd.org  Attention Deficit Disorder Association (ADDA) CondoFactory.com.cy ADD Resources: addresources.org ADD WareHouse: addwarehouse.com World Federation of ADHD: adhd-federation.org ADDConsults: https://www.hines.net/. Future evaluation if deemed necessary and/or to determine effectiveness of recommended interventions.   Ashley Suarez, Psy.D. Licensed Psychologist - [facility] #[id number]             Dolores Lory, PsyD

## 2022-06-18 LAB — CBC
Hematocrit: 38.2 % (ref 34.0–46.6)
Hemoglobin: 13.5 g/dL (ref 11.1–15.9)
MCH: 33.5 pg — ABNORMAL HIGH (ref 26.6–33.0)
MCHC: 35.3 g/dL (ref 31.5–35.7)
MCV: 95 fL (ref 79–97)
Platelets: 322 10*3/uL (ref 150–450)
RBC: 4.03 x10E6/uL (ref 3.77–5.28)
RDW: 11.8 % (ref 11.7–15.4)
WBC: 9.6 10*3/uL (ref 3.4–10.8)

## 2022-06-18 LAB — BASIC METABOLIC PANEL
BUN/Creatinine Ratio: 11 (ref 9–23)
BUN: 8 mg/dL (ref 6–20)
CO2: 21 mmol/L (ref 20–29)
Calcium: 9.5 mg/dL (ref 8.7–10.2)
Chloride: 100 mmol/L (ref 96–106)
Creatinine, Ser: 0.71 mg/dL (ref 0.57–1.00)
Glucose: 86 mg/dL (ref 70–99)
Potassium: 3.9 mmol/L (ref 3.5–5.2)
Sodium: 138 mmol/L (ref 134–144)
eGFR: 111 mL/min/{1.73_m2} (ref >59)

## 2022-06-18 LAB — C-REACTIVE PROTEIN: CRP: 4 mg/L (ref 0–10)

## 2022-06-18 LAB — SEDIMENTATION RATE: Sed Rate: 8 mm/hr (ref 0–32)

## 2022-06-21 ENCOUNTER — Encounter: Payer: Self-pay | Admitting: Family Medicine

## 2022-06-21 DIAGNOSIS — R5383 Other fatigue: Secondary | ICD-10-CM | POA: Insufficient documentation

## 2022-06-21 DIAGNOSIS — J069 Acute upper respiratory infection, unspecified: Secondary | ICD-10-CM | POA: Insufficient documentation

## 2022-06-21 LAB — POCT INFLUENZA A/B
Influenza A, POC: NEGATIVE
Influenza B, POC: NEGATIVE

## 2022-06-21 LAB — POCT RAPID STREP A (OFFICE): Rapid Strep A Screen: NEGATIVE

## 2022-06-21 LAB — POC COVID19 BINAXNOW: SARS Coronavirus 2 Ag: NEGATIVE

## 2022-06-21 NOTE — Assessment & Plan Note (Signed)
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 Negative POC for strep, COVID and flu. Endorses fatigue and malaise. Recommend baseline labs and supportive care. Work note provided x24 hours to assist.

## 2022-06-21 NOTE — Assessment & Plan Note (Signed)
Acute, unknown Reports feeling exhausted and tired Symptoms were gradual Symptoms have worsened Works in healthcare; however, no known sick contacts or changes in [place] Benign exam

## 2022-06-21 NOTE — Progress Notes (Signed)
Normal stable labs. Reassuring given complaints of fatigue. Continue to monitor and work to manage stressors as needed.

## 2022-06-24 DIAGNOSIS — F331 Major depressive disorder, recurrent, moderate: Secondary | ICD-10-CM | POA: Diagnosis not present

## 2022-06-28 ENCOUNTER — Other Ambulatory Visit: Payer: Self-pay

## 2022-06-29 ENCOUNTER — Other Ambulatory Visit: Payer: Self-pay

## 2022-07-01 ENCOUNTER — Telehealth (HOSPITAL_COMMUNITY): Payer: Self-pay | Admitting: *Deleted

## 2022-07-01 ENCOUNTER — Other Ambulatory Visit: Payer: Self-pay

## 2022-07-01 DIAGNOSIS — F902 Attention-deficit hyperactivity disorder, combined type: Secondary | ICD-10-CM

## 2022-07-01 DIAGNOSIS — F331 Major depressive disorder, recurrent, moderate: Secondary | ICD-10-CM | POA: Diagnosis not present

## 2022-07-01 DIAGNOSIS — F319 Bipolar disorder, unspecified: Secondary | ICD-10-CM

## 2022-07-01 MED ORDER — AMPHETAMINE-DEXTROAMPHET ER 10 MG PO CP24
10.0000 mg | ORAL_CAPSULE | Freq: Every day | ORAL | 0 refills | Status: DC
  Filled 2022-07-01: qty 20, 20d supply, fill #0
  Filled 2022-07-02: qty 10, 10d supply, fill #0

## 2022-07-01 MED ORDER — LAMOTRIGINE 25 MG PO TABS
ORAL_TABLET | ORAL | 0 refills | Status: DC
  Filled 2022-07-01: qty 45, 29d supply, fill #0

## 2022-07-01 NOTE — Telephone Encounter (Addendum)
Patient has called   & informed me that she's on day  # 123  & day 4 of  this feeling of dark & ery. and that this time it just feels  different.  STATED THAT THERE'S BEEN CONVERSATION ABOUT HER ASKING SINCE TAKING THE SEROQUEL ??'D  IF SOME OTHER MEDICATION NEEDS TO BE ADDED OR IF SOME MEDICATION NEEDS TO BE CHANGED???

## 2022-07-01 NOTE — Addendum Note (Signed)
Addended by: Charlette Caffey on: 07/01/2022 03:39 PM   Modules accepted: Orders

## 2022-07-02 ENCOUNTER — Other Ambulatory Visit: Payer: Self-pay

## 2022-07-05 ENCOUNTER — Telehealth (HOSPITAL_COMMUNITY): Payer: Self-pay | Admitting: *Deleted

## 2022-07-05 NOTE — Telephone Encounter (Signed)
Pt called to update how the meds are working and says that she feels they are working well. Pt also asked if we have received a ROI for [facility]. I don't see where it's been scanned to her chart and I haven't received it. FYI.

## 2022-07-08 DIAGNOSIS — F331 Major depressive disorder, recurrent, moderate: Secondary | ICD-10-CM | POA: Diagnosis not present
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
## 2022-07-13 ENCOUNTER — Telehealth (HOSPITAL_BASED_OUTPATIENT_CLINIC_OR_DEPARTMENT_OTHER): Payer: 59 | Admitting: Psychiatry

## 2022-07-13 ENCOUNTER — Encounter (HOSPITAL_COMMUNITY): Payer: Self-pay | Admitting: Psychiatry

## 2022-07-13 ENCOUNTER — Other Ambulatory Visit: Payer: Self-pay

## 2022-07-13 DIAGNOSIS — F1021 Alcohol dependence, in remission: Secondary | ICD-10-CM | POA: Diagnosis not present

## 2022-07-13 DIAGNOSIS — F319 Bipolar disorder, unspecified: Secondary | ICD-10-CM | POA: Diagnosis not present

## 2022-07-13 DIAGNOSIS — F609 Personality disorder, unspecified: Secondary | ICD-10-CM

## 2022-07-13 DIAGNOSIS — F1411 Cocaine abuse, in remission: Secondary | ICD-10-CM | POA: Diagnosis not present

## 2022-07-13 DIAGNOSIS — Z79899 Other long term (current) drug therapy: Secondary | ICD-10-CM

## 2022-07-13 DIAGNOSIS — F902 Attention-deficit hyperactivity disorder, combined type: Secondary | ICD-10-CM

## 2022-07-13 MED ORDER — AMPHETAMINE-DEXTROAMPHET ER 10 MG PO CP24
10.0000 mg | ORAL_CAPSULE | Freq: Every day | ORAL | 0 refills | Status: DC
  Filled 2022-07-13 – 2022-08-01 (×2): qty 30, 30d supply, fill #0

## 2022-07-13 MED ORDER — QUETIAPINE FUMARATE ER 50 MG PO TB24
50.0000 mg | ORAL_TABLET | Freq: Two times a day (BID) | ORAL | 2 refills | Status: DC
  Filled 2022-07-13 – 2022-07-26 (×2): qty 60, 30d supply, fill #0
  Filled 2022-08-29: qty 60, 30d supply, fill #1
  Filled 2022-09-24: qty 60, 30d supply, fill #2

## 2022-07-13 MED ORDER — LAMOTRIGINE 25 MG PO TABS
25.0000 mg | ORAL_TABLET | Freq: Every day | ORAL | 2 refills | Status: DC
  Filled 2022-07-13 – 2022-07-26 (×2): qty 30, 30d supply, fill #0

## 2022-07-15 DIAGNOSIS — F331 Major depressive disorder, recurrent, moderate: Secondary | ICD-10-CM | POA: Diagnosis not present

## 2022-07-22 DIAGNOSIS — F331 Major depressive disorder, recurrent, moderate: Secondary | ICD-10-CM | POA: Diagnosis not present

## 2022-07-26 ENCOUNTER — Other Ambulatory Visit: Payer: Self-pay

## 2022-07-26 ENCOUNTER — Other Ambulatory Visit: Payer: Self-pay | Admitting: Physician Assistant

## 2022-07-26 DIAGNOSIS — J301 Allergic rhinitis due to pollen: Secondary | ICD-10-CM

## 2022-07-27 ENCOUNTER — Other Ambulatory Visit: Payer: Self-pay

## 2022-07-27 ENCOUNTER — Other Ambulatory Visit: Payer: Self-pay | Admitting: Physician Assistant

## 2022-07-27 DIAGNOSIS — J301 Allergic rhinitis due to pollen: Secondary | ICD-10-CM

## 2022-07-27 MED FILL — Fluticasone Propionate Nasal Susp 50 MCG/ACT: NASAL | 30 days supply | Qty: 16 | Fill #0 | Status: AC

## 2022-07-29 ENCOUNTER — Other Ambulatory Visit: Payer: Self-pay

## 2022-07-29 DIAGNOSIS — F331 Major depressive disorder, recurrent, moderate: Secondary | ICD-10-CM | POA: Diagnosis not present

## 2022-08-01 ENCOUNTER — Other Ambulatory Visit: Payer: Self-pay

## 2022-08-02 ENCOUNTER — Other Ambulatory Visit: Payer: Self-pay

## 2022-08-05 DIAGNOSIS — F331 Major depressive disorder, recurrent, moderate: Secondary | ICD-10-CM | POA: Diagnosis not present

## 2022-08-12 DIAGNOSIS — F331 Major depressive disorder, recurrent, moderate: Secondary | ICD-10-CM | POA: Diagnosis not present

## 2022-08-17 ENCOUNTER — Telehealth (HOSPITAL_COMMUNITY): Payer: 59 | Admitting: Psychiatry

## 2022-08-19 ENCOUNTER — Telehealth (HOSPITAL_COMMUNITY): Payer: 59 | Admitting: Psychiatry

## 2022-08-19 ENCOUNTER — Encounter (HOSPITAL_COMMUNITY): Payer: Self-pay | Admitting: Psychiatry

## 2022-08-19 ENCOUNTER — Telehealth (HOSPITAL_BASED_OUTPATIENT_CLINIC_OR_DEPARTMENT_OTHER): Payer: 59 | Admitting: Psychiatry

## 2022-08-19 ENCOUNTER — Other Ambulatory Visit: Payer: Self-pay

## 2022-08-19 DIAGNOSIS — F902 Attention-deficit hyperactivity disorder, combined type: Secondary | ICD-10-CM

## 2022-08-19 DIAGNOSIS — F1021 Alcohol dependence, in remission: Secondary | ICD-10-CM | POA: Diagnosis not present

## 2022-08-19 DIAGNOSIS — F319 Bipolar disorder, unspecified: Secondary | ICD-10-CM

## 2022-08-19 DIAGNOSIS — F1411 Cocaine abuse, in remission: Secondary | ICD-10-CM | POA: Diagnosis not present

## 2022-08-19 DIAGNOSIS — F609 Personality disorder, unspecified: Secondary | ICD-10-CM

## 2022-08-19 MED ORDER — AMPHETAMINE-DEXTROAMPHET ER 15 MG PO CP24
15.0000 mg | ORAL_CAPSULE | Freq: Every day | ORAL | 0 refills | Status: DC
  Filled 2022-08-19: qty 30, 30d supply, fill #0

## 2022-08-19 MED ORDER — LAMOTRIGINE 25 MG PO TABS
25.0000 mg | ORAL_TABLET | Freq: Two times a day (BID) | ORAL | 2 refills | Status: DC
  Filled 2022-08-19: qty 60, 30d supply, fill #0
  Filled 2022-09-13: qty 60, 30d supply, fill #1
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
## 2022-08-20 DIAGNOSIS — Z79899 Other long term (current) drug therapy: Secondary | ICD-10-CM | POA: Diagnosis not present

## 2022-08-21 LAB — LIPID PANEL
Chol/HDL Ratio: 4.5 ratio — ABNORMAL HIGH (ref 0.0–4.4)
Cholesterol, Total: 196 mg/dL (ref 100–199)
HDL: 44 mg/dL (ref >39)
LDL Chol Calc (NIH): 133 mg/dL — ABNORMAL HIGH (ref 0–99)
Triglycerides: 103 mg/dL (ref 0–149)
VLDL Cholesterol Cal: 19 mg/dL (ref 5–40)

## 2022-08-26 DIAGNOSIS — F331 Major depressive disorder, recurrent, moderate: Secondary | ICD-10-CM | POA: Diagnosis not present

## 2022-09-02 DIAGNOSIS — F331 Major depressive disorder, recurrent, moderate: Secondary | ICD-10-CM | POA: Diagnosis not present

## 2022-09-09 DIAGNOSIS — F331 Major depressive disorder, recurrent, moderate: Secondary | ICD-10-CM | POA: Diagnosis not present

## 2022-09-13 ENCOUNTER — Other Ambulatory Visit: Payer: Self-pay

## 2022-09-13 ENCOUNTER — Other Ambulatory Visit (HOSPITAL_COMMUNITY): Payer: Self-pay | Admitting: Psychiatry

## 2022-09-13 DIAGNOSIS — F902 Attention-deficit hyperactivity disorder, combined type: Secondary | ICD-10-CM

## 2022-09-13 MED ORDER — AMPHETAMINE-DEXTROAMPHET ER 15 MG PO CP24
15.0000 mg | ORAL_CAPSULE | Freq: Every day | ORAL | 0 refills | Status: DC
  Filled 2022-09-13 – 2022-09-24 (×2): qty 30, 30d supply, fill #0

## 2022-09-15 ENCOUNTER — Other Ambulatory Visit: Payer: Self-pay

## 2022-09-23 DIAGNOSIS — F331 Major depressive disorder, recurrent, moderate: Secondary | ICD-10-CM | POA: Diagnosis not present

## 2022-09-24 ENCOUNTER — Other Ambulatory Visit: Payer: Self-pay

## 2022-09-24 ENCOUNTER — Encounter (HOSPITAL_COMMUNITY): Payer: Self-pay | Admitting: Psychiatry

## 2022-09-24 ENCOUNTER — Telehealth (HOSPITAL_BASED_OUTPATIENT_CLINIC_OR_DEPARTMENT_OTHER): Payer: 59 | Admitting: Psychiatry

## 2022-09-24 DIAGNOSIS — F902 Attention-deficit hyperactivity disorder, combined type: Secondary | ICD-10-CM

## 2022-09-24 DIAGNOSIS — F1411 Cocaine abuse, in remission: Secondary | ICD-10-CM

## 2022-09-24 DIAGNOSIS — F319 Bipolar disorder, unspecified: Secondary | ICD-10-CM | POA: Diagnosis not present

## 2022-09-24 DIAGNOSIS — F1021 Alcohol dependence, in remission: Secondary | ICD-10-CM

## 2022-09-24 DIAGNOSIS — F1091 Alcohol use, unspecified, in remission: Secondary | ICD-10-CM | POA: Diagnosis not present

## 2022-09-24 DIAGNOSIS — F609 Personality disorder, unspecified: Secondary | ICD-10-CM | POA: Diagnosis not present

## 2022-09-24 DIAGNOSIS — F1491 Cocaine use, unspecified, in remission: Secondary | ICD-10-CM

## 2022-09-24 MED ORDER — AMPHETAMINE-DEXTROAMPHET ER 15 MG PO CP24
15.0000 mg | ORAL_CAPSULE | Freq: Every day | ORAL | 0 refills | Status: DC
  Filled 2022-09-24: qty 30, 30d supply, fill #0

## 2022-09-24 MED ORDER — QUETIAPINE FUMARATE ER 50 MG PO TB24
50.0000 mg | ORAL_TABLET | Freq: Two times a day (BID) | ORAL | 1 refills | Status: DC
  Filled 2022-09-24: qty 180, 90d supply, fill #0
  Filled 2022-11-28: qty 180, 90d supply, fill #1

## 2022-09-24 MED ORDER — LAMOTRIGINE 25 MG PO TABS
25.0000 mg | ORAL_TABLET | Freq: Two times a day (BID) | ORAL | 1 refills | Status: DC
  Filled 2022-09-24 – 2022-10-24 (×2): qty 180, 90d supply, fill #0
  Filled 2023-01-19: qty 180, 90d supply, fill #1
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
## 2022-09-27 ENCOUNTER — Other Ambulatory Visit: Payer: Self-pay

## 2022-09-30 ENCOUNTER — Encounter: Payer: Self-pay | Admitting: Physician Assistant

## 2022-10-07 DIAGNOSIS — F331 Major depressive disorder, recurrent, moderate: Secondary | ICD-10-CM | POA: Diagnosis not present

## 2022-10-24 ENCOUNTER — Other Ambulatory Visit (HOSPITAL_COMMUNITY): Payer: Self-pay | Admitting: Psychiatry

## 2022-10-24 ENCOUNTER — Other Ambulatory Visit: Payer: Self-pay

## 2022-10-24 DIAGNOSIS — F902 Attention-deficit hyperactivity disorder, combined type: Secondary | ICD-10-CM

## 2022-10-25 ENCOUNTER — Other Ambulatory Visit: Payer: Self-pay

## 2022-10-25 MED FILL — Amphetamine-Dextroamphetamine Cap ER 24HR 15 MG: ORAL | 30 days supply | Qty: 30 | Fill #0 | Status: AC

## 2022-11-04 DIAGNOSIS — F331 Major depressive disorder, recurrent, moderate: Secondary | ICD-10-CM | POA: Diagnosis not present

## 2022-11-12 ENCOUNTER — Telehealth (HOSPITAL_BASED_OUTPATIENT_CLINIC_OR_DEPARTMENT_OTHER): Payer: 59 | Admitting: Psychiatry

## 2022-11-12 ENCOUNTER — Other Ambulatory Visit: Payer: Self-pay

## 2022-11-12 ENCOUNTER — Telehealth (HOSPITAL_COMMUNITY): Payer: 59 | Admitting: Psychiatry

## 2022-11-12 ENCOUNTER — Encounter (HOSPITAL_COMMUNITY): Payer: Self-pay | Admitting: Psychiatry

## 2022-11-12 DIAGNOSIS — F609 Personality disorder, unspecified: Secondary | ICD-10-CM

## 2022-11-12 DIAGNOSIS — F1411 Cocaine abuse, in remission: Secondary | ICD-10-CM | POA: Diagnosis not present

## 2022-11-12 DIAGNOSIS — F319 Bipolar disorder, unspecified: Secondary | ICD-10-CM

## 2022-11-12 DIAGNOSIS — F902 Attention-deficit hyperactivity disorder, combined type: Secondary | ICD-10-CM | POA: Diagnosis not present

## 2022-11-12 DIAGNOSIS — F1021 Alcohol dependence, in remission: Secondary | ICD-10-CM

## 2022-11-12 MED ORDER — AMPHETAMINE-DEXTROAMPHET ER 15 MG PO CP24
15.0000 mg | ORAL_CAPSULE | Freq: Every day | ORAL | 0 refills | Status: DC
Start: 2022-11-22 — End: 2022-12-24
  Filled 2022-11-28: qty 30, 30d supply, fill #0
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
## 2022-11-15 DIAGNOSIS — F331 Major depressive disorder, recurrent, moderate: Secondary | ICD-10-CM | POA: Diagnosis not present

## 2022-11-25 ENCOUNTER — Other Ambulatory Visit: Payer: Self-pay | Admitting: Obstetrics and Gynecology

## 2022-11-25 ENCOUNTER — Encounter: Payer: Self-pay | Admitting: Physician Assistant

## 2022-11-25 DIAGNOSIS — Z1321 Encounter for screening for nutritional disorder: Secondary | ICD-10-CM

## 2022-11-28 ENCOUNTER — Other Ambulatory Visit: Payer: Self-pay | Admitting: Physician Assistant

## 2022-11-28 DIAGNOSIS — I10 Essential (primary) hypertension: Secondary | ICD-10-CM

## 2022-11-29 ENCOUNTER — Other Ambulatory Visit: Payer: Self-pay

## 2022-11-29 ENCOUNTER — Other Ambulatory Visit: Payer: 59

## 2022-11-29 DIAGNOSIS — Z1321 Encounter for screening for nutritional disorder: Secondary | ICD-10-CM | POA: Diagnosis not present

## 2022-11-29 MED ORDER — LOSARTAN POTASSIUM 50 MG PO TABS
50.0000 mg | ORAL_TABLET | Freq: Every day | ORAL | 1 refills | Status: DC
Start: 2022-11-29 — End: 2023-05-24
  Filled 2022-11-29: qty 90, 90d supply, fill #0
  Filled 2023-02-06: qty 90, 90d supply, fill #1

## 2022-11-30 ENCOUNTER — Telehealth: Payer: Self-pay

## 2022-11-30 ENCOUNTER — Encounter: Payer: Self-pay | Admitting: Family Medicine

## 2022-11-30 ENCOUNTER — Other Ambulatory Visit: Payer: Self-pay

## 2022-11-30 LAB — B12 AND FOLATE PANEL
Folate: >20 ng/mL (ref >3.0)
Vitamin B-12: 449 pg/mL (ref 232–1245)

## 2022-11-30 NOTE — Telephone Encounter (Signed)
Copied from CRM [id number]. Topic: General - Other >> Nov 30, 2022 10:20 AM [place] B wrote: Reason for CRM: pt called in checking status of signing FMLA paperwork, requested started back in June and deadline is tomorrow

## 2022-12-02 DIAGNOSIS — F331 Major depressive disorder, recurrent, moderate: Secondary | ICD-10-CM | POA: Diagnosis not present

## 2022-12-03 ENCOUNTER — Other Ambulatory Visit: Payer: Self-pay

## 2022-12-07 ENCOUNTER — Encounter: Payer: Self-pay | Admitting: Family Medicine

## 2022-12-07 ENCOUNTER — Telehealth: Payer: Self-pay | Admitting: Family Medicine

## 2022-12-07 ENCOUNTER — Telehealth (INDEPENDENT_AMBULATORY_CARE_PROVIDER_SITE_OTHER): Payer: 59 | Admitting: Family Medicine

## 2022-12-07 DIAGNOSIS — F410 Panic disorder [episodic paroxysmal anxiety] without agoraphobia: Secondary | ICD-10-CM

## 2022-12-07 DIAGNOSIS — F319 Bipolar disorder, unspecified: Secondary | ICD-10-CM | POA: Diagnosis not present

## 2022-12-07 DIAGNOSIS — Z7689 Persons encountering health services in other specified circumstances: Secondary | ICD-10-CM | POA: Diagnosis not present

## 2022-12-07 NOTE — Progress Notes (Unsigned)
MyChart Video Visit    Virtual Visit via Video Note   This format is felt to be most appropriate for this patient at this time. Physical exam was limited by quality of the video and audio technology used for the visit.   Patient location: home Provider location: [facility]  I discussed the limitations of evaluation and management by telemedicine and the availability of in person appointments. The patient expressed understanding and agreed to proceed.  Patient: Ashley Suarez   DOB: [birth year]   37 y.o. Female  MRN: [id number] Visit Date: 12/07/2022  Today's healthcare provider: Sherlyn Hay, DO   No chief complaint on file.  Subjective    HPI HPI   Patient is being seen in regards to [facility] paperwork for anxiety. Patient reports she missed work yesterday. Patient reports she is tolerating treatment well. She states it has improved and is in between mild and moderate overall. Symptoms difficulty concentrating, feelings of losing control, insomnia, irritable, palpitations, racing thoughts, and tremors/shakes.  Last edited by Acey Lav, CMA on 12/07/2022  4:12 PM.      Patient reports that she had been doing well overall and recently decreased her frequency of seeing her therapist  Thelma Comp) at [facility] from once a week to every other week.  However, her therapist left open the option for her to return to weekly visits in case that the patient feels like she needs them again.  She did recently have an episode of hypomania and had to take both part of Friday and all of Monday off of work.  She is concerned that she will need to work with Dr. Mercy Riding to adjust her medications as this seems to be becoming more frequent.  During this recent episode, she notes that she only slept 2 hours on Thursday and that is why she went ahead and left work early Friday.  She notes that she sometimes does not sleep at all when going through mania.  Contributing to her stress,  she also had a death in the family which she was notified of on Saturday. Then her 42 year old father fell and called her to come help, so she drove there to make sure everything was okay.  In particular, she becomes stressed because she has to rotate out away from her assigned office to other offices when there is a low census.  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  She states she enjoys learning the processes at those offices but that it can be overwhelming at times.  She notes that she was originally hired during COVID to help wherever needed and found significant job satisfaction in that.   Medications: Outpatient Medications Prior to Visit  Medication Sig   amphetamine-dextroamphetamine (ADDERALL XR) 15 MG 24 hr capsule Take 1 capsule by mouth daily.   B Complex Vitamins (VITAMIN B COMPLEX) TABS Take by mouth.   Blood Pressure Monitoring (BLOOD PRESSURE MONITOR/M CUFF) MISC Use to check blood pressure as needed and at least once a week to monitor hypertension   fluticasone (FLONASE) 50 MCG/ACT nasal spray Place 2 sprays into both nostrils daily.   lamoTRIgine (LAMICTAL) 25 MG tablet Take 1 tablet (25 mg total) by mouth 2 (two) times daily.   losartan (COZAAR) 50 MG tablet Take 1 tablet (50 mg total) by mouth daily.   Multiple Vitamin (MULTI-VITAMINS) TABS Take by mouth.   omeprazole (PRILOSEC) 20 MG capsule  Take 1 capsule (20 mg total) by mouth daily.   ondansetron (ZOFRAN) 4 MG tablet Take 1 tablet (4 mg total) by mouth every 8 (eight) hours as needed.   propranolol (INDERAL) 10 MG tablet Take 1 tablet (10 mg total) by mouth 2 (two) times daily as needed.   QUEtiapine (SEROQUEL XR) 50 MG TB24 24 hr tablet Take 1 tablet (50 mg total) by mouth 2 (two) times daily.   Vitamin D, Ergocalciferol, (DRISDOL) 1.25 MG (50000 UNIT) CAPS capsule Take 1 capsule (50,000 Units total) by mouth every 7 (seven) days.   levonorgestrel  (MIRENA) 20 MCG/24HR IUD 1 Intra Uterine Device (1 each total) by Intrauterine route once for 1 dose.   No facility-administered medications prior to visit.    Review of Systems  Respiratory:  Negative for shortness of breath.   Cardiovascular:  Negative for chest pain.  Psychiatric/Behavioral:  Positive for agitation, decreased concentration and sleep disturbance. Negative for self-injury and suicidal ideas. The patient is nervous/anxious and is hyperactive.          Objective    There were no vitals taken for this visit.       Physical Exam Constitutional:      General: She is not in acute distress.    Appearance: Normal appearance.  HENT:     Head: Normocephalic.  Pulmonary:     Effort: Pulmonary effort is normal. No respiratory distress.  Neurological:     Mental Status: She is alert and oriented to person, place, and time. Mental status is at baseline.        Assessment & Plan    Bipolar and related disorder [facility]) Assessment & Plan: Continues to see psychiatrist, Dr. Mercy Riding, who manages her medications.  She does note that she feels her dosages will need to be adjusted soon as she is started to have increasing episodes of hypomania/mania.  Filled out and faxed patient's FMLA form.   Panic disorder without agoraphobia Assessment & Plan: As noted above, patient continues to be managed by Dr. Mercy Riding.  No changes made today.   Establishing care with new doctor, encounter for Assessment & Plan: Encounter today was also intended to establish care with me, Dr. Payton Mccallum, as her PCP, as her previous provider has left to another practice.     Return in about 2 months (around 02/07/2023) for CPE.     I discussed the assessment and treatment plan with the patient. The patient was provided an opportunity to ask questions and all were answered. The patient agreed with the plan and demonstrated an understanding of the instructions.   The patient was advised to  call back or seek an in-person evaluation if the symptoms worsen or if the condition fails to improve as anticipated.  I provided 27 minutes of virtual-face-to-face time during this encounter.    Sherlyn Hay, DO [facility] [phone] (phone) [phone] (fax)  [facility]

## 2022-12-07 NOTE — Telephone Encounter (Signed)
Secure chat message was sent to Provider about patient has been waiting over a hour for her MyChart visit. Provider stated that she will call patient.

## 2022-12-08 ENCOUNTER — Telehealth: Payer: Self-pay | Admitting: Family Medicine

## 2022-12-08 NOTE — Telephone Encounter (Signed)
LVM-FMLA paperwork is completed and faxed (12/08/2022). A copy will be with the front office.

## 2022-12-09 ENCOUNTER — Encounter: Payer: Self-pay | Admitting: Family Medicine

## 2022-12-09 DIAGNOSIS — Z0000 Encounter for general adult medical examination without abnormal findings: Secondary | ICD-10-CM | POA: Insufficient documentation

## 2022-12-09 DIAGNOSIS — Z7689 Persons encountering health services in other specified circumstances: Secondary | ICD-10-CM | POA: Insufficient documentation

## 2022-12-09 NOTE — Assessment & Plan Note (Addendum)
Encounter today was also intended to establish care with me, Dr. Payton Mccallum, as her PCP, as her previous provider has left to another practice.

## 2022-12-09 NOTE — Assessment & Plan Note (Signed)
As noted above, patient continues to be managed by Dr. Mercy Riding.  No changes made today.

## 2022-12-09 NOTE — Assessment & Plan Note (Addendum)
Continues to see psychiatrist, Dr. Mercy Riding, who manages her medications.  She does note that she feels her dosages will need to be adjusted soon as she is started to have increasing episodes of hypomania/mania.  Filled out and faxed patient's FMLA form.

## 2022-12-16 DIAGNOSIS — F331 Major depressive disorder, recurrent, moderate: Secondary | ICD-10-CM | POA: Diagnosis not present
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
## 2022-12-24 ENCOUNTER — Other Ambulatory Visit: Payer: Self-pay

## 2022-12-24 ENCOUNTER — Encounter (HOSPITAL_COMMUNITY): Payer: Self-pay | Admitting: Psychiatry

## 2022-12-24 ENCOUNTER — Telehealth (HOSPITAL_BASED_OUTPATIENT_CLINIC_OR_DEPARTMENT_OTHER): Payer: 59 | Admitting: Psychiatry

## 2022-12-24 DIAGNOSIS — F902 Attention-deficit hyperactivity disorder, combined type: Secondary | ICD-10-CM

## 2022-12-24 DIAGNOSIS — F609 Personality disorder, unspecified: Secondary | ICD-10-CM

## 2022-12-24 DIAGNOSIS — F1411 Cocaine abuse, in remission: Secondary | ICD-10-CM | POA: Diagnosis not present

## 2022-12-24 DIAGNOSIS — F419 Anxiety disorder, unspecified: Secondary | ICD-10-CM | POA: Diagnosis not present

## 2022-12-24 DIAGNOSIS — F1021 Alcohol dependence, in remission: Secondary | ICD-10-CM | POA: Diagnosis not present

## 2022-12-24 DIAGNOSIS — F319 Bipolar disorder, unspecified: Secondary | ICD-10-CM | POA: Diagnosis not present

## 2022-12-24 MED ORDER — AMPHETAMINE-DEXTROAMPHET ER 15 MG PO CP24
15.0000 mg | ORAL_CAPSULE | ORAL | 0 refills | Status: DC
Start: 2023-01-28 — End: 2023-01-20

## 2022-12-24 MED ORDER — QUETIAPINE FUMARATE ER 50 MG PO TB24
50.0000 mg | ORAL_TABLET | Freq: Two times a day (BID) | ORAL | 1 refills | Status: DC
Start: 2022-12-24 — End: 2023-05-03
  Filled 2022-12-24: qty 180, 90d supply, fill #0

## 2022-12-24 MED ORDER — AMPHETAMINE-DEXTROAMPHET ER 15 MG PO CP24
15.0000 mg | ORAL_CAPSULE | Freq: Every day | ORAL | 0 refills | Status: DC
Start: 2022-12-30 — End: 2023-01-20
  Filled 2022-12-30: qty 30, 30d supply, fill #0

## 2022-12-24 NOTE — Patient Instructions (Signed)
a 

## 2022-12-30 ENCOUNTER — Other Ambulatory Visit: Payer: Self-pay

## 2022-12-30 DIAGNOSIS — F331 Major depressive disorder, recurrent, moderate: Secondary | ICD-10-CM | POA: Diagnosis not present

## 2023-01-12 ENCOUNTER — Telehealth (HOSPITAL_COMMUNITY): Payer: Self-pay

## 2023-01-12 DIAGNOSIS — F609 Personality disorder, unspecified: Secondary | ICD-10-CM

## 2023-01-12 DIAGNOSIS — F319 Bipolar disorder, unspecified: Secondary | ICD-10-CM

## 2023-01-12 NOTE — Telephone Encounter (Signed)
Patient called and would like to change her medication. She said that in your visit you talked about changing to Abilify, patient states she would like to do that. Please review and advise. Thank you

## 2023-01-13 DIAGNOSIS — F331 Major depressive disorder, recurrent, moderate: Secondary | ICD-10-CM | POA: Diagnosis not present

## 2023-01-14 ENCOUNTER — Other Ambulatory Visit: Payer: Self-pay

## 2023-01-14 MED ORDER — ARIPIPRAZOLE 5 MG PO TABS
ORAL_TABLET | ORAL | 2 refills | Status: DC
Start: 2023-01-14 — End: 2023-02-01
  Filled 2023-01-14: qty 27, 30d supply, fill #0
  Filled 2023-01-19: qty 30, 30d supply, fill #1

## 2023-01-14 NOTE — Telephone Encounter (Signed)
Patient was contacted via telephone for approximately  Ash[place] Suarez reports that the past couple weeks she has continued to think about medication adjustments we had discussed. She is still feeling overly lethargic and having difficulty composing her thoughts. While she continues to feel this way it feels like she is starting to backslide and be unable to participate in therapy effectively.  We reviewed tapering off of Seroquel at a rate of 50 mg every week until off the medication while simultaneously starting Abilify 2.5 and increasing to 5 mg after 7 days.  Patient expressed her understanding and we reviewed the risk and benefits of the medications.  She is aware to present to urgent care if needed and will contact the office on Monday if adverse side effects are noted.  Otherwise will follow up at our planned appointment in October.

## 2023-01-19 ENCOUNTER — Other Ambulatory Visit: Payer: Self-pay

## 2023-01-20 ENCOUNTER — Other Ambulatory Visit: Payer: Self-pay

## 2023-01-20 ENCOUNTER — Telehealth (HOSPITAL_COMMUNITY): Payer: Self-pay | Admitting: *Deleted

## 2023-01-20 DIAGNOSIS — F902 Attention-deficit hyperactivity disorder, combined type: Secondary | ICD-10-CM

## 2023-01-20 MED ORDER — AMPHETAMINE-DEXTROAMPHET ER 10 MG PO CP24
10.0000 mg | ORAL_CAPSULE | ORAL | 0 refills | Status: DC
Start: 2023-02-18 — End: 2023-05-03
  Filled 2023-03-14: qty 30, 30d supply, fill #0

## 2023-01-20 MED ORDER — AMPHETAMINE-DEXTROAMPHET ER 10 MG PO CP24
10.0000 mg | ORAL_CAPSULE | Freq: Every day | ORAL | 0 refills | Status: DC
Start: 2023-01-20 — End: 2023-03-31
  Filled 2023-01-20 – 2023-01-27 (×2): qty 30, 30d supply, fill #0

## 2023-01-20 NOTE — Telephone Encounter (Signed)
Pt called requesting a lower dose of Adderall. Pt is currently taking Adderall XR 15 mg every day. Pt states that although she was able to get 7 hours of sleep on 01/18/23 however only slept an hour last night. Looks as though pt has been taking this dose since 08/19/22. Pt last visit was on 12/24/22 and she has a f/u appointment on 03/04/23. Pt states the med adjustments made on last visit seem to be working. Please review and advise.

## 2023-01-20 NOTE — Telephone Encounter (Signed)
Adderall XR 10 mg script was sent to the pharmacy. Can you update the patient, and also confirm she is taking Abilify in the mornings. It is possible for it to negatively impact sleep if taken in the evenings.

## 2023-01-26 ENCOUNTER — Telehealth (HOSPITAL_COMMUNITY): Payer: Self-pay

## 2023-01-26 NOTE — Telephone Encounter (Signed)
I talked to patient this morning, she did get her Adderall prescription, she said thank you. However patient is still having a hard time with sleep. She takes her Abilify at 5 am and has been taking benadryl to help her sleep some. She was wondering id you could prescribe either benadryl or something else to help her sleep. Patient also reports mania that is different from before, she spent some money that she shouldn't have etc. Please review and advise, thank you

## 2023-01-27 ENCOUNTER — Other Ambulatory Visit: Payer: Self-pay

## 2023-01-27 DIAGNOSIS — F331 Major depressive disorder, recurrent, moderate: Secondary | ICD-10-CM | POA: Diagnosis not present

## 2023-01-28 ENCOUNTER — Other Ambulatory Visit: Payer: Self-pay
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
## 2023-02-01 ENCOUNTER — Telehealth (HOSPITAL_BASED_OUTPATIENT_CLINIC_OR_DEPARTMENT_OTHER): Payer: 59 | Admitting: Psychiatry

## 2023-02-01 ENCOUNTER — Encounter (HOSPITAL_COMMUNITY): Payer: Self-pay | Admitting: Psychiatry

## 2023-02-01 ENCOUNTER — Other Ambulatory Visit: Payer: Self-pay

## 2023-02-01 DIAGNOSIS — F1411 Cocaine abuse, in remission: Secondary | ICD-10-CM | POA: Diagnosis not present

## 2023-02-01 DIAGNOSIS — F902 Attention-deficit hyperactivity disorder, combined type: Secondary | ICD-10-CM | POA: Diagnosis not present

## 2023-02-01 DIAGNOSIS — F319 Bipolar disorder, unspecified: Secondary | ICD-10-CM | POA: Diagnosis not present

## 2023-02-01 DIAGNOSIS — F609 Personality disorder, unspecified: Secondary | ICD-10-CM

## 2023-02-01 DIAGNOSIS — F1021 Alcohol dependence, in remission: Secondary | ICD-10-CM

## 2023-02-01 MED ORDER — PROPRANOLOL HCL 10 MG PO TABS
10.0000 mg | ORAL_TABLET | Freq: Three times a day (TID) | ORAL | 1 refills | Status: DC | PRN
  Filled 2023-02-01 – 2023-04-19 (×2): qty 60, 20d supply, fill #0

## 2023-02-01 MED ORDER — ARIPIPRAZOLE 10 MG PO TABS
10.0000 mg | ORAL_TABLET | Freq: Every day | ORAL | 2 refills | Status: DC
Start: 2023-02-01 — End: 2023-03-31
  Filled 2023-02-01: qty 30, 30d supply, fill #0
  Filled 2023-03-04: qty 30, 30d supply, fill #1

## 2023-02-01 MED ORDER — LAMOTRIGINE 25 MG PO TABS
50.0000 mg | ORAL_TABLET | Freq: Two times a day (BID) | ORAL | 1 refills | Status: DC
  Filled 2023-02-01 – 2023-03-08 (×4): qty 180, 45d supply, fill #0
  Filled 2023-04-23 – 2023-04-25 (×2): qty 180, 45d supply, fill #1

## 2023-02-03 ENCOUNTER — Encounter (HOSPITAL_COMMUNITY): Payer: Self-pay

## 2023-02-03 DIAGNOSIS — F331 Major depressive disorder, recurrent, moderate: Secondary | ICD-10-CM | POA: Diagnosis not present

## 2023-02-06 ENCOUNTER — Other Ambulatory Visit: Payer: Self-pay

## 2023-02-07 ENCOUNTER — Encounter: Payer: Self-pay | Admitting: Family Medicine

## 2023-02-07 ENCOUNTER — Other Ambulatory Visit: Payer: Self-pay

## 2023-02-07 ENCOUNTER — Ambulatory Visit (INDEPENDENT_AMBULATORY_CARE_PROVIDER_SITE_OTHER): Payer: 59 | Admitting: Family Medicine

## 2023-02-07 VITALS — BP 129/85 | HR 85 | Temp 98.4°F | Ht 66.0 in | Wt 189.6 lb

## 2023-02-07 DIAGNOSIS — R5382 Chronic fatigue, unspecified: Secondary | ICD-10-CM

## 2023-02-07 DIAGNOSIS — E559 Vitamin D deficiency, unspecified: Secondary | ICD-10-CM

## 2023-02-07 DIAGNOSIS — I10 Essential (primary) hypertension: Secondary | ICD-10-CM

## 2023-02-07 DIAGNOSIS — Z0000 Encounter for general adult medical examination without abnormal findings: Secondary | ICD-10-CM

## 2023-02-07 DIAGNOSIS — F319 Bipolar disorder, unspecified: Secondary | ICD-10-CM | POA: Diagnosis not present

## 2023-02-07 DIAGNOSIS — F1021 Alcohol dependence, in remission: Secondary | ICD-10-CM

## 2023-02-07 DIAGNOSIS — F609 Personality disorder, unspecified: Secondary | ICD-10-CM | POA: Diagnosis not present

## 2023-02-07 DIAGNOSIS — F17200 Nicotine dependence, unspecified, uncomplicated: Secondary | ICD-10-CM

## 2023-02-07 DIAGNOSIS — F1411 Cocaine abuse, in remission: Secondary | ICD-10-CM

## 2023-02-07 DIAGNOSIS — Z716 Tobacco abuse counseling: Secondary | ICD-10-CM

## 2023-02-07 MED ORDER — NICOTINE POLACRILEX 4 MG MT GUM
4.0000 mg | CHEWING_GUM | OROMUCOSAL | 0 refills | Status: DC | PRN
Start: 2023-02-07 — End: 2023-08-08
  Filled 2023-02-07: qty 110, 12d supply, fill #0

## 2023-02-07 NOTE — Patient Instructions (Addendum)
Check home blood pressures. Let me know if they are staying >120/80.

## 2023-02-07 NOTE — Assessment & Plan Note (Addendum)
Fairly well-controlled today. On losartan 50 mg daily; will maintain and reassess at the next visit.

## 2023-02-08 DIAGNOSIS — F331 Major depressive disorder, recurrent, moderate: Secondary | ICD-10-CM | POA: Diagnosis not present

## 2023-02-09 ENCOUNTER — Other Ambulatory Visit: Payer: Self-pay

## 2023-02-13 ENCOUNTER — Encounter: Payer: Self-pay | Admitting: Family Medicine

## 2023-02-13 DIAGNOSIS — Z716 Tobacco abuse counseling: Secondary | ICD-10-CM | POA: Insufficient documentation

## 2023-02-13 DIAGNOSIS — F1021 Alcohol dependence, in remission: Secondary | ICD-10-CM | POA: Insufficient documentation

## 2023-02-13 NOTE — Assessment & Plan Note (Signed)
Counseled patient regarding smoking cessation. Ordered nicotine replacement therapy as noted below.

## 2023-02-13 NOTE — Assessment & Plan Note (Signed)
Continues to follow w/Dr. Mercy Riding and reports doing fairly well recently.  Will defer to management by psychiatry.

## 2023-02-13 NOTE — Assessment & Plan Note (Addendum)
Continues to follow w/Dr. Mercy Riding and reports doing fairly well recently.  She reports she has not used alcohol since October 2023.  Will defer to management by psychiatry.

## 2023-02-13 NOTE — Assessment & Plan Note (Signed)
Physical exam overall unremarkable except as noted above. Routine lab work ordered as noted below.

## 2023-02-13 NOTE — Assessment & Plan Note (Signed)
Continues to follow w/Dr. Mercy Riding and reports doing fairly well recently.  Will defer to management by psychiatry. Continues to be in sustained remission.

## 2023-02-13 NOTE — Assessment & Plan Note (Addendum)
Continues to follow w/Dr. Mercy Riding and reports doing fairly well recently.  Will defer to management by psychiatry.
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
## 2023-02-24 ENCOUNTER — Telehealth (HOSPITAL_COMMUNITY): Payer: 59 | Admitting: Psychiatry

## 2023-02-24 ENCOUNTER — Encounter (HOSPITAL_COMMUNITY): Payer: Self-pay | Admitting: Psychiatry

## 2023-02-24 DIAGNOSIS — F902 Attention-deficit hyperactivity disorder, combined type: Secondary | ICD-10-CM | POA: Diagnosis not present

## 2023-02-24 DIAGNOSIS — F331 Major depressive disorder, recurrent, moderate: Secondary | ICD-10-CM | POA: Diagnosis not present

## 2023-02-24 DIAGNOSIS — F319 Bipolar disorder, unspecified: Secondary | ICD-10-CM | POA: Diagnosis not present

## 2023-02-24 DIAGNOSIS — F609 Personality disorder, unspecified: Secondary | ICD-10-CM

## 2023-02-24 DIAGNOSIS — F1411 Cocaine abuse, in remission: Secondary | ICD-10-CM | POA: Diagnosis not present

## 2023-02-24 DIAGNOSIS — F1021 Alcohol dependence, in remission: Secondary | ICD-10-CM | POA: Diagnosis not present

## 2023-03-04 ENCOUNTER — Telehealth (HOSPITAL_COMMUNITY): Payer: 59 | Admitting: Psychiatry

## 2023-03-04 ENCOUNTER — Other Ambulatory Visit: Payer: Self-pay

## 2023-03-05 ENCOUNTER — Other Ambulatory Visit: Payer: Self-pay

## 2023-03-08 ENCOUNTER — Other Ambulatory Visit: Payer: Self-pay

## 2023-03-10 DIAGNOSIS — F331 Major depressive disorder, recurrent, moderate: Secondary | ICD-10-CM | POA: Diagnosis not present

## 2023-03-14 ENCOUNTER — Other Ambulatory Visit (HOSPITAL_COMMUNITY): Payer: Self-pay

## 2023-03-14 ENCOUNTER — Other Ambulatory Visit (HOSPITAL_COMMUNITY): Payer: Self-pay | Admitting: Psychiatry

## 2023-03-14 ENCOUNTER — Other Ambulatory Visit: Payer: Self-pay

## 2023-03-14 DIAGNOSIS — F902 Attention-deficit hyperactivity disorder, combined type: Secondary | ICD-10-CM

## 2023-03-23 DIAGNOSIS — F331 Major depressive disorder, recurrent, moderate: Secondary | ICD-10-CM | POA: Diagnosis not present
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
## 2023-03-31 ENCOUNTER — Other Ambulatory Visit: Payer: Self-pay

## 2023-03-31 ENCOUNTER — Telehealth (HOSPITAL_COMMUNITY): Payer: 59 | Admitting: Psychiatry

## 2023-03-31 ENCOUNTER — Encounter (HOSPITAL_COMMUNITY): Payer: Self-pay | Admitting: Psychiatry

## 2023-03-31 DIAGNOSIS — F1021 Alcohol dependence, in remission: Secondary | ICD-10-CM

## 2023-03-31 DIAGNOSIS — F609 Personality disorder, unspecified: Secondary | ICD-10-CM

## 2023-03-31 DIAGNOSIS — F1411 Cocaine abuse, in remission: Secondary | ICD-10-CM | POA: Diagnosis not present

## 2023-03-31 DIAGNOSIS — F902 Attention-deficit hyperactivity disorder, combined type: Secondary | ICD-10-CM

## 2023-03-31 DIAGNOSIS — F319 Bipolar disorder, unspecified: Secondary | ICD-10-CM | POA: Diagnosis not present

## 2023-03-31 MED ORDER — AMPHETAMINE-DEXTROAMPHET ER 10 MG PO CP24
10.0000 mg | ORAL_CAPSULE | Freq: Every day | ORAL | 0 refills | Status: DC
  Filled 2023-04-19: qty 30, 30d supply, fill #0

## 2023-03-31 MED ORDER — ARIPIPRAZOLE 10 MG PO TABS
10.0000 mg | ORAL_TABLET | Freq: Every day | ORAL | 2 refills | Status: DC
  Filled 2023-03-31: qty 30, 30d supply, fill #0
  Filled 2023-05-02: qty 30, 30d supply, fill #1

## 2023-03-31 MED ORDER — ARIPIPRAZOLE 5 MG PO TABS
2.5000 mg | ORAL_TABLET | Freq: Every day | ORAL | 2 refills | Status: DC
Start: 2023-03-31 — End: 2023-05-03
  Filled 2023-03-31: qty 15, 30d supply, fill #0
  Filled 2023-04-23 – 2023-04-25 (×2): qty 15, 30d supply, fill #1

## 2023-04-07 DIAGNOSIS — F331 Major depressive disorder, recurrent, moderate: Secondary | ICD-10-CM | POA: Diagnosis not present

## 2023-04-19 ENCOUNTER — Other Ambulatory Visit: Payer: Self-pay

## 2023-04-21 DIAGNOSIS — F331 Major depressive disorder, recurrent, moderate: Secondary | ICD-10-CM | POA: Diagnosis not present

## 2023-04-25 ENCOUNTER — Other Ambulatory Visit: Payer: Self-pay
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
## 2023-05-03 ENCOUNTER — Encounter (HOSPITAL_COMMUNITY): Payer: Self-pay | Admitting: Psychiatry

## 2023-05-03 ENCOUNTER — Other Ambulatory Visit: Payer: Self-pay

## 2023-05-03 ENCOUNTER — Telehealth (HOSPITAL_COMMUNITY): Payer: 59 | Admitting: Psychiatry

## 2023-05-03 DIAGNOSIS — F902 Attention-deficit hyperactivity disorder, combined type: Secondary | ICD-10-CM | POA: Diagnosis not present

## 2023-05-03 DIAGNOSIS — F609 Personality disorder, unspecified: Secondary | ICD-10-CM | POA: Diagnosis not present

## 2023-05-03 DIAGNOSIS — F319 Bipolar disorder, unspecified: Secondary | ICD-10-CM

## 2023-05-03 DIAGNOSIS — F1411 Cocaine abuse, in remission: Secondary | ICD-10-CM

## 2023-05-03 DIAGNOSIS — F1021 Alcohol dependence, in remission: Secondary | ICD-10-CM

## 2023-05-03 MED ORDER — ARIPIPRAZOLE 5 MG PO TABS
2.5000 mg | ORAL_TABLET | Freq: Every day | ORAL | 0 refills | Status: DC
Start: 2023-05-03 — End: 2023-06-23
  Filled 2023-05-03 – 2023-05-24 (×2): qty 45, 90d supply, fill #0

## 2023-05-03 MED ORDER — AMPHETAMINE-DEXTROAMPHET ER 15 MG PO CP24
15.0000 mg | ORAL_CAPSULE | Freq: Every day | ORAL | 0 refills | Status: DC
  Filled 2023-05-03: qty 30, 30d supply, fill #0

## 2023-05-03 MED ORDER — AMPHETAMINE-DEXTROAMPHET ER 15 MG PO CP24
15.0000 mg | ORAL_CAPSULE | ORAL | 0 refills | Status: DC
  Filled 2023-06-27: qty 30, 30d supply, fill #0

## 2023-05-03 MED ORDER — ARIPIPRAZOLE 10 MG PO TABS
10.0000 mg | ORAL_TABLET | Freq: Every day | ORAL | 0 refills | Status: DC
  Filled 2023-05-04: qty 90, 90d supply, fill #0

## 2023-05-04 ENCOUNTER — Other Ambulatory Visit: Payer: Self-pay

## 2023-05-05 DIAGNOSIS — F331 Major depressive disorder, recurrent, moderate: Secondary | ICD-10-CM | POA: Diagnosis not present

## 2023-05-19 DIAGNOSIS — F331 Major depressive disorder, recurrent, moderate: Secondary | ICD-10-CM | POA: Diagnosis not present

## 2023-05-24 ENCOUNTER — Other Ambulatory Visit: Payer: Self-pay

## 2023-05-24 ENCOUNTER — Other Ambulatory Visit: Payer: Self-pay | Admitting: Physician Assistant

## 2023-05-24 DIAGNOSIS — I10 Essential (primary) hypertension: Secondary | ICD-10-CM

## 2023-05-24 MED ORDER — LOSARTAN POTASSIUM 50 MG PO TABS
50.0000 mg | ORAL_TABLET | Freq: Every day | ORAL | 1 refills | Status: DC
  Filled 2023-05-24: qty 90, 90d supply, fill #0
  Filled 2023-08-21: qty 90, 90d supply, fill #1

## 2023-06-01 ENCOUNTER — Other Ambulatory Visit: Payer: Self-pay

## 2023-06-14 ENCOUNTER — Ambulatory Visit (INDEPENDENT_AMBULATORY_CARE_PROVIDER_SITE_OTHER): Payer: Commercial Managed Care - PPO | Admitting: Obstetrics

## 2023-06-14 ENCOUNTER — Encounter: Payer: Self-pay | Admitting: Obstetrics

## 2023-06-14 VITALS — BP 138/94 | HR 127 | Ht 66.0 in | Wt 188.0 lb

## 2023-06-14 DIAGNOSIS — R232 Flushing: Secondary | ICD-10-CM

## 2023-06-14 DIAGNOSIS — F331 Major depressive disorder, recurrent, moderate: Secondary | ICD-10-CM | POA: Diagnosis not present

## 2023-06-14 DIAGNOSIS — Z01419 Encounter for gynecological examination (general) (routine) without abnormal findings: Secondary | ICD-10-CM

## 2023-06-14 DIAGNOSIS — Z1231 Encounter for screening mammogram for malignant neoplasm of breast: Secondary | ICD-10-CM

## 2023-06-14 NOTE — Patient Instructions (Signed)
Preventive Care 40-40 Years Old, Female  Preventive care refers to lifestyle choices and visits with your health care provider that can promote health and wellness. Preventive care visits are also called wellness exams.  What can I expect for my preventive care visit?  Counseling  Your health care provider may ask you questions about your:  Medical history, including:  Past medical problems.  Family medical history.  Pregnancy history.  Current health, including:  Menstrual cycle.  Method of birth control.  Emotional well-being.  Home life and relationship well-being.  Sexual activity and sexual health.  Lifestyle, including:  Alcohol, nicotine or tobacco, and drug use.  Access to firearms.  Diet, exercise, and sleep habits.  Work and work Astronomer.  Sunscreen use.  Safety issues such as seatbelt and bike helmet use.  Physical exam  Your health care provider will check your:  Height and weight. These may be used to calculate your BMI (body mass index). BMI is a measurement that tells if you are at a healthy weight.  Waist circumference. This measures the distance around your waistline. This measurement also tells if you are at a healthy weight and may help predict your risk of certain diseases, such as type 2 diabetes and high blood pressure.  Heart rate and blood pressure.  Body temperature.  Skin for abnormal spots.  What immunizations do I need?    Vaccines are usually given at various ages, according to a schedule. Your health care provider will recommend vaccines for you based on your age, medical history, and lifestyle or other factors, such as travel or where you work.  What tests do I need?  Screening  Your health care provider may recommend screening tests for certain conditions. This may include:  Lipid and cholesterol levels.  Diabetes screening. This is done by checking your blood sugar (glucose) after you have not eaten for a while (fasting).  Pelvic exam and Pap test.  Hepatitis B test.  Hepatitis C  test.  HIV (human immunodeficiency virus) test.  STI (sexually transmitted infection) testing, if you are at risk.  Lung cancer screening.  Colorectal cancer screening.  Mammogram. Talk with your health care provider about when you should start having regular mammograms. This may depend on whether you have a family history of breast cancer.  BRCA-related cancer screening. This may be done if you have a family history of breast, ovarian, tubal, or peritoneal cancers.  Bone density scan. This is done to screen for osteoporosis.  Talk with your health care provider about your test results, treatment options, and if necessary, the need for more tests.  Follow these instructions at home:  Eating and drinking    Eat a diet that includes fresh fruits and vegetables, whole grains, lean protein, and low-fat dairy products.  Take vitamin and mineral supplements as recommended by your health care provider.  Do not drink alcohol if:  Your health care provider tells you not to drink.  You are pregnant, may be pregnant, or are planning to become pregnant.  If you drink alcohol:  Limit how much you have to 0-1 drink a day.  Know how much alcohol is in your drink. In the [place] one drink equals one 12 oz bottle of beer (355 mL), one 5 oz glass of wine (148 mL), or one 1 oz glass of hard liquor (44 mL).  Lifestyle  Brush your teeth every morning and night with fluoride toothpaste. Floss one time each day.  Exercise for at least  30 minutes 5 or more days each week.  Do not use any products that contain nicotine or tobacco. These products include cigarettes, chewing tobacco, and vaping devices, such as e-cigarettes. If you need help quitting, ask your health care provider.  Do not use drugs.  If you are sexually active, practice safe sex. Use a condom or other form of protection to prevent STIs.  If you do not wish to become pregnant, use a form of birth control. If you plan to become pregnant, see your health care provider for a  prepregnancy visit.  Take aspirin only as told by your health care provider. Make sure that you understand how much to take and what form to take. Work with your health care provider to find out whether it is safe and beneficial for you to take aspirin daily.  Find healthy ways to manage stress, such as:  Meditation, yoga, or listening to music.  Journaling.  Talking to a trusted person.  Spending time with friends and family.  Minimize exposure to UV radiation to reduce your risk of skin cancer.  Safety  Always wear your seat belt while driving or riding in a vehicle.  Do not drive:  If you have been drinking alcohol. Do not ride with someone who has been drinking.  When you are tired or distracted.  While texting.  If you have been using any mind-altering substances or drugs.  Wear a helmet and other protective equipment during sports activities.  If you have firearms in your house, make sure you follow all gun safety procedures.  Seek help if you have been physically or sexually abused.  What's next?  Visit your health care provider once a year for an annual wellness visit.  Ask your health care provider how often you should have your eyes and teeth checked.  Stay up to date on all vaccines.  This information is not intended to replace advice given to you by your health care provider. Make sure you discuss any questions you have with your health care provider.  Document Revised: 10/29/2020 Document Reviewed: 10/29/2020  Elsevier Patient Education  2024 [employer].
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
## 2023-06-23 ENCOUNTER — Other Ambulatory Visit: Payer: Self-pay

## 2023-06-23 ENCOUNTER — Telehealth (HOSPITAL_COMMUNITY): Payer: Commercial Managed Care - PPO | Admitting: Psychiatry

## 2023-06-23 ENCOUNTER — Encounter (HOSPITAL_COMMUNITY): Payer: Self-pay | Admitting: Psychiatry

## 2023-06-23 DIAGNOSIS — F1021 Alcohol dependence, in remission: Secondary | ICD-10-CM | POA: Diagnosis not present

## 2023-06-23 DIAGNOSIS — F1411 Cocaine abuse, in remission: Secondary | ICD-10-CM | POA: Diagnosis not present

## 2023-06-23 DIAGNOSIS — F902 Attention-deficit hyperactivity disorder, combined type: Secondary | ICD-10-CM

## 2023-06-23 DIAGNOSIS — F609 Personality disorder, unspecified: Secondary | ICD-10-CM

## 2023-06-23 DIAGNOSIS — F319 Bipolar disorder, unspecified: Secondary | ICD-10-CM

## 2023-06-23 MED ORDER — LAMOTRIGINE 25 MG PO TABS
50.0000 mg | ORAL_TABLET | Freq: Two times a day (BID) | ORAL | 1 refills | Status: DC
  Filled 2023-06-23: qty 180, 45d supply, fill #0
  Filled 2023-08-21: qty 180, 45d supply, fill #1

## 2023-06-23 MED ORDER — ARIPIPRAZOLE 10 MG PO TABS
10.0000 mg | ORAL_TABLET | Freq: Every day | ORAL | 0 refills | Status: DC
Start: 2023-06-23 — End: 2023-09-01
  Filled 2023-06-23 – 2023-08-07 (×3): qty 90, 90d supply, fill #0

## 2023-06-23 MED ORDER — AMPHETAMINE-DEXTROAMPHET ER 15 MG PO CP24
15.0000 mg | ORAL_CAPSULE | Freq: Every day | ORAL | 0 refills | Status: DC
  Filled 2023-08-07: qty 30, 30d supply, fill #0

## 2023-06-23 MED ORDER — ARIPIPRAZOLE 5 MG PO TABS
2.5000 mg | ORAL_TABLET | Freq: Every day | ORAL | 0 refills | Status: DC
  Filled 2023-06-23 – 2023-08-07 (×4): qty 45, 90d supply, fill #0

## 2023-06-27 ENCOUNTER — Other Ambulatory Visit: Payer: Self-pay

## 2023-06-28 ENCOUNTER — Other Ambulatory Visit: Payer: Self-pay

## 2023-07-14 DIAGNOSIS — F331 Major depressive disorder, recurrent, moderate: Secondary | ICD-10-CM | POA: Diagnosis not present

## 2023-07-21 ENCOUNTER — Ambulatory Visit: Payer: Self-pay | Admitting: Family Medicine

## 2023-07-21 NOTE — Telephone Encounter (Signed)
 Copied from CRM [id number]. Topic: Clinical - Red Word Triage >> Jul 21, 2023  3:21 PM Elle L wrote: Red Word that prompted transfer to Nurse Triage: The patient is having vertigo, dizziness, generalized weakness, and she is congested. She took an at home COVID test and it was negative. She is requesting to see if she should come into the office or an Urgent Care.

## 2023-07-21 NOTE — Telephone Encounter (Signed)
 Copied from CRM [id number]. Topic: Clinical - Medical Advice >> Jul 21, 2023  3:13 PM Shelah Lewandowsky wrote: Reason for CRM: has vertigo and also needs covid test, wants to know if the office does walk ins for covid testing, system issue lost call before I could assist further [phone]   Additional Notes: This Triage Nurse attempted to contact the patient at this time, no answer, left a voicemail.

## 2023-07-21 NOTE — Telephone Encounter (Signed)
  Chief Complaint: dizziness, nasal congestion, vertigo with standing.  Symptoms: generalized weakness, dizziness , lightheaded. Vertigo when standing. Covid at home test negative  Frequency: yesterday  Pertinent Negatives: Patient denies chest pain no difficulty breathing no fever  Disposition: [] ED /[] Urgent Care (no appt availability in office) / [x] Appointment(In office/virtual)/ []  [facility]/ [] Home Care/ [] Refused Recommended Disposition /[] [place] [facility]/ []  Follow-up with PCP Additional Notes:   No available appt with PCP . [facility] apt scheduled [facility] at [facility].     Reason for Disposition  [1] MODERATE dizziness (e.g., interferes with normal activities) AND [2] has NOT been evaluated by doctor (or NP/PA) for this  (Exception: Dizziness caused by heat exposure, sudden standing, or poor fluid intake.)  Answer Assessment - Initial Assessment Questions 1. DESCRIPTION: "Describe your dizziness."     Lightheaded, nasal congestion 2. LIGHTHEADED: "Do you feel lightheaded?" (e.g., somewhat faint, woozy, weak upon standing)     Weak standing  3. VERTIGO: "Do you feel like either you or the room is spinning or tilting?" (i.e. vertigo)     Room spinning  4. SEVERITY: "How bad is it?"  "Do you feel like you are going to faint?" "Can you stand and walk?"   - MILD: Feels slightly dizzy, but walking normally.   - MODERATE: Feels unsteady when walking, but not falling; interferes with normal activities (e.g., school, work).   - SEVERE: Unable to walk without falling, or requires assistance to walk without falling; feels like passing out now.      Left work early  5. ONSET:  "When did the dizziness begin?"     Congestion yesterday .  6. AGGRAVATING FACTORS: "Does anything make it worse?" (e.g., standing, change in head position)     Standing  7. HEART RATE: "Can you tell me your heart rate?" "How many beats in 15 seconds?"  (Note: not all patients can do this)        na 8. CAUSE: "What do you think is causing the dizziness?"     Congestion 9. RECURRENT SYMPTOM: "Have you had dizziness before?" If Yes, ask: "When was the last time?" "What happened that time?"     Na  10. OTHER SYMPTOMS: "Do you have any other symptoms?" (e.g., fever, chest pain, vomiting, diarrhea, bleeding)       Dizziness, congestion generalized weakness 11. PREGNANCY: "Is there any chance you are pregnant?" "When was your last menstrual period?"       na  Protocols used: Dizziness - Lightheadedness-A-AH

## 2023-07-22 ENCOUNTER — Ambulatory Visit: Admitting: Internal Medicine

## 2023-07-22 ENCOUNTER — Other Ambulatory Visit: Payer: Self-pay

## 2023-07-22 ENCOUNTER — Encounter: Payer: Self-pay | Admitting: Internal Medicine

## 2023-07-22 VITALS — BP 132/84 | HR 100 | Temp 98.0°F | Resp 16 | Ht 64.0 in | Wt 190.3 lb

## 2023-07-22 DIAGNOSIS — J069 Acute upper respiratory infection, unspecified: Secondary | ICD-10-CM | POA: Diagnosis not present

## 2023-07-22 DIAGNOSIS — R42 Dizziness and giddiness: Secondary | ICD-10-CM

## 2023-07-22 DIAGNOSIS — H6991 Unspecified Eustachian tube disorder, right ear: Secondary | ICD-10-CM

## 2023-07-22 DIAGNOSIS — R051 Acute cough: Secondary | ICD-10-CM | POA: Diagnosis not present

## 2023-07-22 LAB — POC COVID19/FLU A&B COMBO
Covid Antigen, POC: NEGATIVE
Influenza A Antigen, POC: NEGATIVE
Influenza B Antigen, POC: NEGATIVE

## 2023-07-22 MED ORDER — METHYLPREDNISOLONE 4 MG PO TBPK
ORAL_TABLET | ORAL | 0 refills | Status: AC
Start: 2023-07-22 — End: 2023-07-28
  Filled 2023-07-22: qty 21, 6d supply, fill #0

## 2023-07-22 MED ORDER — BENZONATATE 100 MG PO CAPS
100.0000 mg | ORAL_CAPSULE | Freq: Two times a day (BID) | ORAL | 0 refills | Status: DC | PRN
  Filled 2023-07-22: qty 20, 10d supply, fill #0

## 2023-07-22 NOTE — Progress Notes (Signed)
 Acute Office Visit  Subjective:     Patient ID: Ashley Suarez, female    DOB: 09-27-1985, 38 y.o.   MRN: [id number]  Chief Complaint  Patient presents with   URI    Cough, congestion for 2 days. Covid test at home negative   Dizziness    1 day    HPI Patient is in today for cough, congestion and dizziness. She is a [facility] patient and this is my first time meeting her. She states that the cough started last night, congestion has been going on about 3 days now. Dizziness started yesterday, could not go to work. Took  a home COVID test that negative.   URI Compliant:  -Fever: no -Cough: yes dry -Shortness of breath: yes -Wheezing: no -Nasal congestion: yes -Runny nose: yes -Post nasal drip: yes -Sore throat: no -Sinus pressure: yes -Headache: no -Face pain: no -Ear pain: yes "right -Ear pressure: yes "right -Sick contacts: yes coworker with similar symptoms -Context: stable -Treatments attempted: Flonase    Review of Systems  Constitutional:  Negative for chills and fever.  HENT:  Positive for congestion, ear pain and sinus pain. Negative for sore throat.   Respiratory:  Positive for cough and sputum production. Negative for shortness of breath and wheezing.   Cardiovascular:  Negative for chest pain.  Neurological:  Positive for dizziness. Negative for headaches.        Objective:    BP 132/84 (Cuff Size: Large)   Pulse 100   Temp 98 F (36.7 C) (Oral)   Resp 16   Ht 5\' 4"  (1.626 m)   Wt 190 lb 4.8 oz (86.3 kg)   SpO2 96%   BMI 32.66 kg/m  BP Readings from Last 3 Encounters:  07/22/23 132/84  06/14/23 (!) 138/94  02/07/23 129/85   Wt Readings from Last 3 Encounters:  07/22/23 190 lb 4.8 oz (86.3 kg)  06/14/23 188 lb (85.3 kg)  02/07/23 189 lb 9.6 oz (86 kg)      Physical Exam Constitutional:      Appearance: Normal appearance.  HENT:     Head: Normocephalic and atraumatic.     Right Ear: Ear canal and external ear normal.     Left Ear:  Tympanic membrane, ear canal and external ear normal.     Ears:     Comments: Right eustachian tube dysfunction    Nose: Nose normal.     Mouth/Throat:     Mouth: Mucous membranes are moist.     Comments: Mild PND Eyes:     Conjunctiva/sclera: Conjunctivae normal.  Cardiovascular:     Rate and Rhythm: Normal rate and regular rhythm.  Pulmonary:     Effort: Pulmonary effort is normal.     Breath sounds: Wheezing present. No rhonchi or rales.     Comments: Inspiratory wheezing in the bilateral apex Skin:    General: Skin is warm and dry.  Neurological:     General: No focal deficit present.     Mental Status: She is alert. Mental status is at baseline.  Psychiatric:        Mood and Affect: Mood normal.        Behavior: Behavior normal.     Results for orders placed or performed in visit on 07/22/23  POC Covid19/Flu A&B Antigen  Result Value Ref Range   Influenza A Antigen, POC Negative Negative   Influenza B Antigen, POC Negative Negative   Covid Antigen, POC Negative Negative  Assessment & Plan:   1. Viral upper respiratory tract infection (Primary)/Vertigo/Eustachian tube disorder, right/Acute cough: Rapid COVID and flu test negative here today. Symptoms consistent with viral illness, discussed treating symptomatically. She does have a right sided eustachian tube dysfunction which is most likely contributing to her vertigo. Will treat with medrol dose pack, continue nasal saline and steroids and start anti-histamine as well. Will prescribe cough suppressant as well. Work note given. Follow up if symptoms worsen or fail to improve.   - methylPREDNISolone (MEDROL DOSEPAK) 4 MG TBPK tablet; Take 6 tablets (24 mg total) by mouth daily for 1 day, THEN 5 tablets (20 mg total) daily for 1 day, THEN 4 tablets (16 mg total) daily for 1 day, THEN 3 tablets (12 mg total) daily for 1 day, THEN 2 tablets (8 mg total) daily for 1 day, THEN 1 tablet (4 mg total) daily for 1 day.   Dispense: 21 tablet; Refill: 0 - benzonatate (TESSALON) 100 MG capsule; Take 1 capsule (100 mg total) by mouth 2 (two) times daily as needed for cough.  Dispense: 20 capsule; Refill: 0 - POC Covid19/Flu A&B Antigen   Return if symptoms worsen or fail to improve.  Margarita Mail, DO

## 2023-07-28 ENCOUNTER — Other Ambulatory Visit: Payer: Self-pay

## 2023-08-05 ENCOUNTER — Other Ambulatory Visit: Payer: Self-pay | Admitting: Family Medicine

## 2023-08-05 ENCOUNTER — Other Ambulatory Visit: Payer: Self-pay

## 2023-08-05 DIAGNOSIS — F17200 Nicotine dependence, unspecified, uncomplicated: Secondary | ICD-10-CM

## 2023-08-07 ENCOUNTER — Other Ambulatory Visit: Payer: Self-pay

## 2023-08-08 ENCOUNTER — Other Ambulatory Visit: Payer: Self-pay

## 2023-08-08 MED FILL — Nicotine Polacrilex Gum 4 MG: OROMUCOSAL | 12 days supply | Qty: 110 | Fill #0 | Status: AC

## 2023-08-08 NOTE — Telephone Encounter (Signed)
 Requested Prescriptions  Pending Prescriptions Disp Refills   nicotine polacrilex (SM NICOTINE POLACRILEX) 4 MG gum [Pharmacy Med Name: nicotine polacrilex (NICORETTE) 4 MG gum] 90 tablet 0    Sig: Take 1 each (4 mg total) by mouth as needed for smoking cessation.     Psychiatry:  Drug Dependence Therapy Passed - 08/08/2023 11:48 AM      Passed - Valid encounter within last 12 months    Recent Outpatient Visits           6 months ago Annual physical exam   [facility] [place], Elysabeth Aust, DO   8 months ago Bipolar and related disorder [facility])   [facility] [place], Monico Blitz, DO   1 year ago Fatigue, unspecified type   [facility] Jacky Kindle, FNP   1 year ago Annual physical exam   [facility] [name], PA-C   1 year ago Bipolar and related disorder [facility])   [facility] [name], [place]

## 2023-08-09 ENCOUNTER — Other Ambulatory Visit: Payer: Self-pay

## 2023-08-11 DIAGNOSIS — F331 Major depressive disorder, recurrent, moderate: Secondary | ICD-10-CM | POA: Diagnosis not present

## 2023-08-12 ENCOUNTER — Ambulatory Visit
Admission: RE | Admit: 2023-08-12 | Discharge: 2023-08-12 | Disposition: A | Discharge status: Home / Self Care | Payer: Commercial Managed Care - PPO | Source: Ambulatory Visit | Attending: Obstetrics | Admitting: Obstetrics

## 2023-08-12 DIAGNOSIS — H52223 Regular astigmatism, bilateral: Secondary | ICD-10-CM | POA: Diagnosis not present

## 2023-08-12 DIAGNOSIS — Z01419 Encounter for gynecological examination (general) (routine) without abnormal findings: Secondary | ICD-10-CM

## 2023-08-12 DIAGNOSIS — H5202 Hypermetropia, left eye: Secondary | ICD-10-CM | POA: Diagnosis not present

## 2023-08-12 DIAGNOSIS — Z1231 Encounter for screening mammogram for malignant neoplasm of breast: Secondary | ICD-10-CM | POA: Diagnosis not present

## 2023-08-12 DIAGNOSIS — R928 Other abnormal and inconclusive findings on diagnostic imaging of breast: Secondary | ICD-10-CM | POA: Diagnosis not present

## 2023-08-15 ENCOUNTER — Other Ambulatory Visit: Payer: Self-pay | Admitting: Obstetrics

## 2023-08-15 DIAGNOSIS — R928 Other abnormal and inconclusive findings on diagnostic imaging of breast: Secondary | ICD-10-CM

## 2023-08-16 ENCOUNTER — Encounter: Payer: Self-pay | Admitting: Obstetrics and Gynecology
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
## 2023-08-31 ENCOUNTER — Other Ambulatory Visit: Payer: Self-pay | Admitting: Obstetrics

## 2023-08-31 DIAGNOSIS — R928 Other abnormal and inconclusive findings on diagnostic imaging of breast: Secondary | ICD-10-CM

## 2023-09-01 ENCOUNTER — Encounter (HOSPITAL_COMMUNITY): Payer: Self-pay | Admitting: Psychiatry

## 2023-09-01 ENCOUNTER — Telehealth (HOSPITAL_COMMUNITY): Payer: Commercial Managed Care - PPO | Admitting: Psychiatry

## 2023-09-01 ENCOUNTER — Ambulatory Visit
Admission: RE | Admit: 2023-09-01 | Discharge: 2023-09-01 | Disposition: A | Discharge status: Home / Self Care | Source: Ambulatory Visit | Attending: Obstetrics | Admitting: Obstetrics

## 2023-09-01 ENCOUNTER — Other Ambulatory Visit: Payer: Self-pay

## 2023-09-01 ENCOUNTER — Other Ambulatory Visit

## 2023-09-01 ENCOUNTER — Encounter

## 2023-09-01 DIAGNOSIS — R928 Other abnormal and inconclusive findings on diagnostic imaging of breast: Secondary | ICD-10-CM | POA: Insufficient documentation

## 2023-09-01 DIAGNOSIS — Z79899 Other long term (current) drug therapy: Secondary | ICD-10-CM | POA: Diagnosis not present

## 2023-09-01 DIAGNOSIS — Z5181 Encounter for therapeutic drug level monitoring: Secondary | ICD-10-CM

## 2023-09-01 DIAGNOSIS — R92333 Mammographic heterogeneous density, bilateral breasts: Secondary | ICD-10-CM | POA: Diagnosis not present

## 2023-09-01 DIAGNOSIS — F609 Personality disorder, unspecified: Secondary | ICD-10-CM | POA: Diagnosis not present

## 2023-09-01 DIAGNOSIS — F319 Bipolar disorder, unspecified: Secondary | ICD-10-CM | POA: Diagnosis not present

## 2023-09-01 DIAGNOSIS — F1021 Alcohol dependence, in remission: Secondary | ICD-10-CM | POA: Diagnosis not present

## 2023-09-01 DIAGNOSIS — F1411 Cocaine abuse, in remission: Secondary | ICD-10-CM | POA: Diagnosis not present

## 2023-09-01 DIAGNOSIS — F902 Attention-deficit hyperactivity disorder, combined type: Secondary | ICD-10-CM | POA: Diagnosis not present

## 2023-09-01 DIAGNOSIS — N6311 Unspecified lump in the right breast, upper outer quadrant: Secondary | ICD-10-CM | POA: Diagnosis not present

## 2023-09-01 MED ORDER — ARIPIPRAZOLE 5 MG PO TABS
2.5000 mg | ORAL_TABLET | Freq: Every day | ORAL | 0 refills | Status: DC
  Filled 2023-09-01 – 2023-10-24 (×2): qty 45, 90d supply, fill #0

## 2023-09-01 MED ORDER — AMPHETAMINE-DEXTROAMPHET ER 15 MG PO CP24
15.0000 mg | ORAL_CAPSULE | ORAL | 0 refills | Status: DC
  Filled 2023-10-25: qty 30, 30d supply, fill #0

## 2023-09-01 MED ORDER — AMPHETAMINE-DEXTROAMPHET ER 15 MG PO CP24
15.0000 mg | ORAL_CAPSULE | Freq: Every day | ORAL | 0 refills | Status: DC
  Filled 2023-09-07 – 2023-09-19 (×2): qty 30, 30d supply, fill #0

## 2023-09-01 MED ORDER — ARIPIPRAZOLE 10 MG PO TABS
10.0000 mg | ORAL_TABLET | Freq: Every day | ORAL | 0 refills | Status: DC
Start: 2023-09-01 — End: 2023-11-26
  Filled 2023-09-01 – 2023-10-24 (×2): qty 90, 90d supply, fill #0

## 2023-09-02 ENCOUNTER — Other Ambulatory Visit: Payer: Self-pay | Admitting: Obstetrics

## 2023-09-02 DIAGNOSIS — R928 Other abnormal and inconclusive findings on diagnostic imaging of breast: Secondary | ICD-10-CM

## 2023-09-02 DIAGNOSIS — N63 Unspecified lump in unspecified breast: Secondary | ICD-10-CM

## 2023-09-07 ENCOUNTER — Other Ambulatory Visit: Payer: Self-pay

## 2023-09-08 DIAGNOSIS — F331 Major depressive disorder, recurrent, moderate: Secondary | ICD-10-CM | POA: Diagnosis not present

## 2023-09-19 ENCOUNTER — Other Ambulatory Visit: Payer: Self-pay

## 2023-09-21 ENCOUNTER — Other Ambulatory Visit: Payer: Self-pay | Admitting: Family Medicine

## 2023-09-21 ENCOUNTER — Other Ambulatory Visit: Payer: Self-pay

## 2023-09-21 DIAGNOSIS — F17200 Nicotine dependence, unspecified, uncomplicated: Secondary | ICD-10-CM

## 2023-09-21 MED ORDER — NICOTINE POLACRILEX 4 MG MT GUM
4.0000 mg | CHEWING_GUM | OROMUCOSAL | 0 refills | Status: DC | PRN
  Filled 2023-09-21: qty 110, 12d supply, fill #0

## 2023-10-06 ENCOUNTER — Telehealth: Payer: Self-pay

## 2023-10-06 DIAGNOSIS — F331 Major depressive disorder, recurrent, moderate: Secondary | ICD-10-CM | POA: Diagnosis not present

## 2023-10-06 NOTE — Telephone Encounter (Signed)
 Copied from CRM [id number]. Topic: General - Other >> Oct 06, 2023  3:06 PM Georgeann Kindred wrote: Reason for CRM: Patient requesting call or MyChart msg from provider or their Nurse regarding FMLA paperwork coming up due. She is wanting to know if the provider will go ahead and file it for her or if she will need to come in for an appointment to have it competed. Phone: [phone]

## 2023-10-21 ENCOUNTER — Other Ambulatory Visit: Payer: Self-pay

## 2023-10-21 ENCOUNTER — Other Ambulatory Visit: Payer: Self-pay | Admitting: Family Medicine

## 2023-10-21 DIAGNOSIS — F17200 Nicotine dependence, unspecified, uncomplicated: Secondary | ICD-10-CM

## 2023-10-24 ENCOUNTER — Other Ambulatory Visit: Payer: Self-pay

## 2023-10-24 ENCOUNTER — Other Ambulatory Visit (HOSPITAL_COMMUNITY): Payer: Self-pay | Admitting: Psychiatry

## 2023-10-24 DIAGNOSIS — F319 Bipolar disorder, unspecified: Secondary | ICD-10-CM

## 2023-10-24 DIAGNOSIS — F902 Attention-deficit hyperactivity disorder, combined type: Secondary | ICD-10-CM

## 2023-10-24 DIAGNOSIS — F609 Personality disorder, unspecified: Secondary | ICD-10-CM

## 2023-10-24 MED FILL — Nicotine Polacrilex Gum 4 MG: OROMUCOSAL | 30 days supply | Qty: 110 | Fill #0 | Status: AC

## 2023-10-24 NOTE — Telephone Encounter (Signed)
 Requested Prescriptions  Pending Prescriptions Disp Refills   FT NICOTINE  4 MG gum [Pharmacy Med Name: nicotine  polacrilex (FT NICOTINE ) 4 MG gum] 110 each 0    Sig: Take 1 each (4 mg total) by mouth as needed for smoking cessation.     Psychiatry:  Drug Dependence Therapy Failed - 10/24/2023  9:35 AM      Failed - Valid encounter within last 12 months    Recent Outpatient Visits           3 months ago Viral upper respiratory tract infection   [facility] [place], [place]

## 2023-10-25 ENCOUNTER — Other Ambulatory Visit (HOSPITAL_COMMUNITY): Payer: Self-pay | Admitting: Psychiatry

## 2023-10-25 ENCOUNTER — Other Ambulatory Visit: Payer: Self-pay

## 2023-10-25 DIAGNOSIS — F609 Personality disorder, unspecified: Secondary | ICD-10-CM

## 2023-10-25 DIAGNOSIS — F319 Bipolar disorder, unspecified: Secondary | ICD-10-CM

## 2023-10-28 ENCOUNTER — Other Ambulatory Visit

## 2023-10-28 DIAGNOSIS — Z79899 Other long term (current) drug therapy: Secondary | ICD-10-CM | POA: Diagnosis not present

## 2023-10-29 LAB — LIPID PANEL
Chol/HDL Ratio: 2.7 ratio (ref 0.0–4.4)
Cholesterol, Total: 161 mg/dL (ref 100–199)
HDL: 60 mg/dL (ref >39)
LDL Chol Calc (NIH): 89 mg/dL (ref 0–99)
Triglycerides: 57 mg/dL (ref 0–149)
VLDL Cholesterol Cal: 12 mg/dL (ref 5–40)

## 2023-10-29 LAB — HEMOGLOBIN A1C
Est. average glucose Bld gHb Est-mCnc: 97 mg/dL
Hgb A1c MFr Bld: 5 % (ref 4.8–5.6)

## 2023-10-30 ENCOUNTER — Other Ambulatory Visit (HOSPITAL_COMMUNITY): Payer: Self-pay | Admitting: Psychiatry

## 2023-10-30 DIAGNOSIS — F319 Bipolar disorder, unspecified: Secondary | ICD-10-CM

## 2023-10-30 DIAGNOSIS — F609 Personality disorder, unspecified: Secondary | ICD-10-CM

## 2023-10-31 ENCOUNTER — Other Ambulatory Visit: Payer: Self-pay

## 2023-10-31 MED ORDER — LAMOTRIGINE 25 MG PO TABS
50.0000 mg | ORAL_TABLET | Freq: Two times a day (BID) | ORAL | 0 refills | Status: DC
  Filled 2023-10-31: qty 360, 90d supply, fill #0
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
## 2023-11-02 LAB — TOXASSURE SELECT 13 (MW), URINE

## 2023-11-03 ENCOUNTER — Telehealth (HOSPITAL_COMMUNITY): Admitting: Psychiatry

## 2023-11-03 ENCOUNTER — Other Ambulatory Visit: Payer: Self-pay

## 2023-11-03 DIAGNOSIS — F609 Personality disorder, unspecified: Secondary | ICD-10-CM

## 2023-11-03 DIAGNOSIS — F1411 Cocaine abuse, in remission: Secondary | ICD-10-CM | POA: Diagnosis not present

## 2023-11-03 DIAGNOSIS — F319 Bipolar disorder, unspecified: Secondary | ICD-10-CM | POA: Diagnosis not present

## 2023-11-03 DIAGNOSIS — F902 Attention-deficit hyperactivity disorder, combined type: Secondary | ICD-10-CM

## 2023-11-03 MED ORDER — CLONIDINE HCL 0.1 MG PO TABS
0.1000 mg | ORAL_TABLET | Freq: Every evening | ORAL | 1 refills | Status: AC
  Filled 2023-11-03: qty 30, 30d supply, fill #0

## 2023-11-03 MED ORDER — LAMOTRIGINE 100 MG PO TABS
100.0000 mg | ORAL_TABLET | Freq: Two times a day (BID) | ORAL | 0 refills | Status: DC
  Filled 2023-11-03: qty 180, 90d supply, fill #0

## 2023-11-03 MED ORDER — AMPHETAMINE-DEXTROAMPHET ER 15 MG PO CP24
15.0000 mg | ORAL_CAPSULE | Freq: Every day | ORAL | 0 refills | Status: DC
  Filled 2023-12-12: qty 30, 30d supply, fill #0

## 2023-11-04 ENCOUNTER — Encounter (HOSPITAL_COMMUNITY): Payer: Self-pay | Admitting: Psychiatry

## 2023-11-11 ENCOUNTER — Telehealth: Payer: Self-pay | Admitting: Family Medicine

## 2023-11-11 NOTE — Telephone Encounter (Signed)
 Noted

## 2023-11-11 NOTE — Telephone Encounter (Signed)
 Received a fax from [facility] and placed in Dr. Greggory box.

## 2023-11-15 DIAGNOSIS — Z0279 Encounter for issue of other medical certificate: Secondary | ICD-10-CM

## 2023-11-16 ENCOUNTER — Other Ambulatory Visit: Payer: Self-pay | Admitting: Family Medicine

## 2023-11-16 ENCOUNTER — Telehealth: Payer: Self-pay

## 2023-11-16 DIAGNOSIS — F17200 Nicotine dependence, unspecified, uncomplicated: Secondary | ICD-10-CM

## 2023-11-16 NOTE — Telephone Encounter (Signed)
 Copied from CRM [id number]. Topic: Appointments - Scheduling Inquiry for Clinic >> Nov 16, 2023  3:53 PM Rosaria BRAVO wrote: Reason for CRM: Pt called wanting to know if she needs to keep her appt this Monday.   Best contact: [phone]

## 2023-11-17 ENCOUNTER — Other Ambulatory Visit: Payer: Self-pay

## 2023-11-17 ENCOUNTER — Other Ambulatory Visit: Payer: Self-pay | Admitting: Family Medicine

## 2023-11-17 DIAGNOSIS — F17200 Nicotine dependence, unspecified, uncomplicated: Secondary | ICD-10-CM

## 2023-11-20 ENCOUNTER — Other Ambulatory Visit: Payer: Self-pay

## 2023-11-20 MED FILL — Nicotine Polacrilex Gum 4 MG: OROMUCOSAL | 10 days supply | Qty: 110 | Fill #0 | Status: AC

## 2023-11-21 ENCOUNTER — Ambulatory Visit: Admitting: Family Medicine

## 2023-11-21 ENCOUNTER — Other Ambulatory Visit: Payer: Self-pay
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
## 2023-11-26 ENCOUNTER — Telehealth (HOSPITAL_BASED_OUTPATIENT_CLINIC_OR_DEPARTMENT_OTHER): Admitting: Psychiatry

## 2023-11-26 ENCOUNTER — Encounter (HOSPITAL_COMMUNITY): Payer: Self-pay | Admitting: Psychiatry

## 2023-11-26 DIAGNOSIS — F609 Personality disorder, unspecified: Secondary | ICD-10-CM | POA: Diagnosis not present

## 2023-11-26 DIAGNOSIS — F902 Attention-deficit hyperactivity disorder, combined type: Secondary | ICD-10-CM

## 2023-11-26 DIAGNOSIS — F319 Bipolar disorder, unspecified: Secondary | ICD-10-CM | POA: Diagnosis not present

## 2023-11-26 DIAGNOSIS — F1411 Cocaine abuse, in remission: Secondary | ICD-10-CM | POA: Diagnosis not present

## 2023-11-26 MED ORDER — ARIPIPRAZOLE 10 MG PO TABS
10.0000 mg | ORAL_TABLET | Freq: Every day | ORAL | 0 refills | Status: DC
  Filled 2023-11-26 – 2023-12-02 (×2): qty 90, 90d supply, fill #0

## 2023-11-26 MED ORDER — AMPHETAMINE-DEXTROAMPHET ER 15 MG PO CP24
15.0000 mg | ORAL_CAPSULE | ORAL | 0 refills | Status: DC
  Filled 2024-01-21: qty 30, 30d supply, fill #0

## 2023-11-26 MED ORDER — ARIPIPRAZOLE 5 MG PO TABS
2.5000 mg | ORAL_TABLET | Freq: Every day | ORAL | 0 refills | Status: DC
  Filled 2023-11-26 – 2023-12-02 (×2): qty 45, 90d supply, fill #0

## 2023-11-27 ENCOUNTER — Other Ambulatory Visit: Payer: Self-pay

## 2023-11-28 ENCOUNTER — Other Ambulatory Visit: Payer: Self-pay

## 2023-12-01 DIAGNOSIS — F331 Major depressive disorder, recurrent, moderate: Secondary | ICD-10-CM | POA: Diagnosis not present

## 2023-12-02 ENCOUNTER — Other Ambulatory Visit: Payer: Self-pay

## 2023-12-02 ENCOUNTER — Other Ambulatory Visit: Payer: Self-pay | Admitting: Family Medicine

## 2023-12-02 DIAGNOSIS — I10 Essential (primary) hypertension: Secondary | ICD-10-CM

## 2023-12-02 MED ORDER — LOSARTAN POTASSIUM 50 MG PO TABS
50.0000 mg | ORAL_TABLET | Freq: Every day | ORAL | 1 refills | Status: DC
  Filled 2023-12-02: qty 90, 90d supply, fill #0
  Filled 2024-03-05: qty 90, 90d supply, fill #1

## 2023-12-12 ENCOUNTER — Other Ambulatory Visit: Payer: Self-pay

## 2023-12-29 DIAGNOSIS — F331 Major depressive disorder, recurrent, moderate: Secondary | ICD-10-CM | POA: Diagnosis not present

## 2024-01-10 ENCOUNTER — Other Ambulatory Visit: Payer: Self-pay | Admitting: Family Medicine

## 2024-01-10 DIAGNOSIS — F17200 Nicotine dependence, unspecified, uncomplicated: Secondary | ICD-10-CM

## 2024-01-11 ENCOUNTER — Other Ambulatory Visit: Payer: Self-pay

## 2024-01-11 MED ORDER — NICOTINE POLACRILEX 4 MG MT GUM
4.0000 mg | CHEWING_GUM | OROMUCOSAL | 0 refills | Status: AC | PRN
  Filled 2024-01-11: qty 110, 12d supply, fill #0

## 2024-01-12 ENCOUNTER — Other Ambulatory Visit: Payer: Self-pay
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
## 2024-01-19 ENCOUNTER — Other Ambulatory Visit: Payer: Self-pay

## 2024-01-19 ENCOUNTER — Telehealth (HOSPITAL_BASED_OUTPATIENT_CLINIC_OR_DEPARTMENT_OTHER): Admitting: Psychiatry

## 2024-01-19 ENCOUNTER — Encounter (HOSPITAL_COMMUNITY): Payer: Self-pay

## 2024-01-19 ENCOUNTER — Encounter (HOSPITAL_COMMUNITY): Payer: Self-pay | Admitting: Psychiatry

## 2024-01-19 DIAGNOSIS — F902 Attention-deficit hyperactivity disorder, combined type: Secondary | ICD-10-CM | POA: Diagnosis not present

## 2024-01-19 DIAGNOSIS — F1411 Cocaine abuse, in remission: Secondary | ICD-10-CM

## 2024-01-19 DIAGNOSIS — F609 Personality disorder, unspecified: Secondary | ICD-10-CM | POA: Diagnosis not present

## 2024-01-19 DIAGNOSIS — F319 Bipolar disorder, unspecified: Secondary | ICD-10-CM

## 2024-01-19 MED ORDER — LAMOTRIGINE 100 MG PO TABS
100.0000 mg | ORAL_TABLET | Freq: Two times a day (BID) | ORAL | 0 refills | Status: DC
Start: 2024-01-19 — End: 2024-03-29
  Filled 2024-01-19 – 2024-03-28 (×2): qty 180, 90d supply, fill #0

## 2024-01-19 MED ORDER — ARIPIPRAZOLE 10 MG PO TABS
10.0000 mg | ORAL_TABLET | Freq: Every day | ORAL | 0 refills | Status: DC
Start: 2024-01-19 — End: 2024-03-29
  Filled 2024-01-19: qty 90, 90d supply, fill #0

## 2024-01-19 MED ORDER — ARIPIPRAZOLE 5 MG PO TABS
2.5000 mg | ORAL_TABLET | Freq: Every day | ORAL | 0 refills | Status: DC
  Filled 2024-01-19 – 2024-01-23 (×2): qty 45, 90d supply, fill #0

## 2024-01-19 MED ORDER — AMPHETAMINE-DEXTROAMPHET ER 15 MG PO CP24
15.0000 mg | ORAL_CAPSULE | Freq: Every day | ORAL | 0 refills | Status: DC
  Filled 2024-02-29: qty 30, 30d supply, fill #0

## 2024-01-20 ENCOUNTER — Other Ambulatory Visit: Payer: Self-pay

## 2024-01-21 ENCOUNTER — Other Ambulatory Visit: Payer: Self-pay

## 2024-01-23 ENCOUNTER — Other Ambulatory Visit: Payer: Self-pay

## 2024-01-26 DIAGNOSIS — F331 Major depressive disorder, recurrent, moderate: Secondary | ICD-10-CM | POA: Diagnosis not present

## 2024-02-08 ENCOUNTER — Ambulatory Visit (INDEPENDENT_AMBULATORY_CARE_PROVIDER_SITE_OTHER): Admitting: Family Medicine

## 2024-02-08 ENCOUNTER — Encounter: Payer: Self-pay | Admitting: Family Medicine

## 2024-02-08 VITALS — BP 132/87 | HR 92 | Ht 66.0 in | Wt 184.6 lb

## 2024-02-08 DIAGNOSIS — Z716 Tobacco abuse counseling: Secondary | ICD-10-CM | POA: Diagnosis not present

## 2024-02-08 DIAGNOSIS — F410 Panic disorder [episodic paroxysmal anxiety] without agoraphobia: Secondary | ICD-10-CM | POA: Diagnosis not present

## 2024-02-08 DIAGNOSIS — E538 Deficiency of other specified B group vitamins: Secondary | ICD-10-CM

## 2024-02-08 DIAGNOSIS — Z79899 Other long term (current) drug therapy: Secondary | ICD-10-CM | POA: Diagnosis not present

## 2024-02-08 DIAGNOSIS — F17200 Nicotine dependence, unspecified, uncomplicated: Secondary | ICD-10-CM

## 2024-02-08 DIAGNOSIS — F319 Bipolar disorder, unspecified: Secondary | ICD-10-CM

## 2024-02-08 DIAGNOSIS — F909 Attention-deficit hyperactivity disorder, unspecified type: Secondary | ICD-10-CM | POA: Diagnosis not present

## 2024-02-08 DIAGNOSIS — Z0000 Encounter for general adult medical examination without abnormal findings: Secondary | ICD-10-CM

## 2024-02-08 DIAGNOSIS — Z8249 Family history of ischemic heart disease and other diseases of the circulatory system: Secondary | ICD-10-CM | POA: Diagnosis not present

## 2024-02-08 DIAGNOSIS — Z136 Encounter for screening for cardiovascular disorders: Secondary | ICD-10-CM

## 2024-02-08 DIAGNOSIS — I10 Essential (primary) hypertension: Secondary | ICD-10-CM | POA: Diagnosis not present

## 2024-02-08 DIAGNOSIS — Z0001 Encounter for general adult medical examination with abnormal findings: Secondary | ICD-10-CM | POA: Diagnosis not present

## 2024-02-08 DIAGNOSIS — E559 Vitamin D deficiency, unspecified: Secondary | ICD-10-CM
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
## 2024-02-08 NOTE — Patient Instructions (Signed)
 Check with work to see if you've received the hepatitis B series.

## 2024-02-23 DIAGNOSIS — F331 Major depressive disorder, recurrent, moderate: Secondary | ICD-10-CM | POA: Diagnosis not present

## 2024-02-29 ENCOUNTER — Other Ambulatory Visit: Payer: Self-pay

## 2024-02-29 ENCOUNTER — Other Ambulatory Visit (HOSPITAL_COMMUNITY): Payer: Self-pay | Admitting: Psychiatry

## 2024-02-29 DIAGNOSIS — F609 Personality disorder, unspecified: Secondary | ICD-10-CM

## 2024-02-29 DIAGNOSIS — F319 Bipolar disorder, unspecified: Secondary | ICD-10-CM

## 2024-03-01 ENCOUNTER — Other Ambulatory Visit: Payer: Self-pay | Admitting: Obstetrics

## 2024-03-01 DIAGNOSIS — N644 Mastodynia: Secondary | ICD-10-CM

## 2024-03-01 DIAGNOSIS — R928 Other abnormal and inconclusive findings on diagnostic imaging of breast: Secondary | ICD-10-CM

## 2024-03-02 DIAGNOSIS — F331 Major depressive disorder, recurrent, moderate: Secondary | ICD-10-CM | POA: Diagnosis not present

## 2024-03-09 ENCOUNTER — Ambulatory Visit
Admission: RE | Admit: 2024-03-09 | Discharge: 2024-03-09 | Disposition: A | Discharge status: Home / Self Care | Source: Ambulatory Visit | Attending: Obstetrics | Admitting: Obstetrics

## 2024-03-09 ENCOUNTER — Encounter: Payer: Self-pay | Admitting: Cardiology

## 2024-03-09 ENCOUNTER — Ambulatory Visit: Attending: Cardiology | Admitting: Cardiology

## 2024-03-09 ENCOUNTER — Other Ambulatory Visit: Payer: Self-pay

## 2024-03-09 VITALS — BP 130/80 | HR 92 | Ht 66.0 in | Wt 183.4 lb

## 2024-03-09 DIAGNOSIS — R072 Precordial pain: Secondary | ICD-10-CM | POA: Diagnosis not present

## 2024-03-09 DIAGNOSIS — I209 Angina pectoris, unspecified: Secondary | ICD-10-CM | POA: Diagnosis not present

## 2024-03-09 DIAGNOSIS — R0609 Other forms of dyspnea: Secondary | ICD-10-CM

## 2024-03-09 DIAGNOSIS — I10 Essential (primary) hypertension: Secondary | ICD-10-CM | POA: Diagnosis not present

## 2024-03-09 DIAGNOSIS — F17200 Nicotine dependence, unspecified, uncomplicated: Secondary | ICD-10-CM | POA: Diagnosis not present

## 2024-03-09 DIAGNOSIS — R928 Other abnormal and inconclusive findings on diagnostic imaging of breast: Secondary | ICD-10-CM | POA: Diagnosis not present

## 2024-03-09 DIAGNOSIS — N63 Unspecified lump in unspecified breast: Secondary | ICD-10-CM | POA: Diagnosis not present

## 2024-03-09 DIAGNOSIS — N6311 Unspecified lump in the right breast, upper outer quadrant: Secondary | ICD-10-CM | POA: Diagnosis not present

## 2024-03-09 MED ORDER — METOPROLOL TARTRATE 100 MG PO TABS
ORAL_TABLET | ORAL | 0 refills | Status: DC
  Filled 2024-03-09: qty 1, 1d supply, fill #0

## 2024-03-09 NOTE — Patient Instructions (Signed)
 Medication Instructions:  - take one 100 mg metoprolol two hours prior to cardiac CT *If you need a refill on your cardiac medications before your next appointment, please call your pharmacy*  Lab Work: Your provider would like for you to have following labs drawn today BMP.   If you have labs (blood work) drawn today and your tests are completely normal, you will receive your results only by: MyChart Message (if you have MyChart) OR A paper copy in the mail If you have any lab test that is abnormal or we need to change your treatment, we will call you to review the results.  Testing/Procedures: Your physician has requested that you have an echocardiogram. Echocardiography is a painless test that uses sound waves to create images of your heart. It provides your doctor with information about the size and shape of your heart and how well your heart's chambers and valves are working.   You may receive an ultrasound enhancing agent through an IV if needed to better visualize your heart during the echo. This procedure takes approximately one hour.  There are no restrictions for this procedure.  This will take place at [address] [facility] [address] [facility]) [address], [place] [address]  Please note: We ask at that you not bring children with you during ultrasound (echo/ vascular) testing. Due to room size and safety concerns, children are not allowed in the ultrasound rooms during exams. Our front office staff cannot provide observation of children in our lobby area while testing is being conducted. An adult accompanying a patient to their appointment will only be allowed in the ultrasound room at the discretion of the ultrasound technician under special circumstances. We apologize for any inconvenience.   Your cardiac CT will be scheduled at:  [facility] [address] [place], [place] [address] [phone]  Please arrive 15 mins early for check-in and test  prep.  There is spacious parking and easy access to the radiology department from the [facility] entrance. Please enter here and check-in with the desk attendant.    Please follow these instructions carefully (unless otherwise directed):  An IV will be required for this test and Nitroglycerin will be given.  Hold all erectile dysfunction medications at least 3 days (72 hrs) prior to test. (Ie viagra, cialis, sildenafil, tadalafil, etc)     On the Night Before the Test: Be sure to Drink plenty of water. Do not consume any caffeinated/decaffeinated beverages or chocolate 12 hours prior to your test. Do not take any antihistamines 12 hours prior to your test.  On the Day of the Test: Drink plenty of water until 1 hour prior to the test. Do not eat any food 1 hour prior to test. You may take your regular medications prior to the test.  Take metoprolol (Lopressor) two hours prior to test. Patients who wear a continuous glucose monitor MUST remove the device prior to scanning. FEMALES- please wear underwire-free bra if available, avoid dresses & tight clothing       After the Test: Drink plenty of water. After receiving IV contrast, you may experience a mild flushed feeling. This is normal. On occasion, you may experience a mild rash up to 24 hours after the test. This is not dangerous. If this occurs, you can take Benadryl 25 mg, Zyrtec , Claritin, or Allegra and increase your fluid intake. (Patients taking Tikosyn should avoid Benadryl, and may take Zyrtec , Claritin, or Allegra) If you experience trouble breathing,  this can be serious. If it is severe call 911 IMMEDIATELY. If it is mild, please call our office.  We will call to schedule your test 2-4 weeks out understanding that some insurance companies will need an authorization prior to the service being performed.   For more information and frequently asked questions, please visit our website :  [email or web address]  For non-scheduling related questions, please contact the cardiac imaging nurse navigator should you have any questions/concerns: Cardiac Imaging Nurse Navigators Direct Office Dial: [phone]   For scheduling needs, including cancellations and rescheduling, please call [place], [phone].    Follow-Up: At [facility], you and your health needs are our priority.  As part of our continuing mission to provide you with exceptional heart care, our providers are all part of one team.  This team includes your primary Cardiologist (physician) and Advanced Practice Providers or APPs (Physician Assistants and Nurse Practitioners) who all work together to provide you with the care you need, when you need it.  Your next appointment:   3 month(s)  Provider:   You may see Dr Darliss or one of the following Advanced Practice Providers on your designated Care Team:   Lonni Meager, NP Lesley Maffucci, PA-C Bernardino Bring, PA-C Cadence Pocatello, PA-C Tylene Lunch, NP Barnie Hila, NP    We recommend signing up for the patient portal called MyChart.  Sign up information is provided on this After Visit Summary.  MyChart is used to connect with patients for Virtual Visits (Telemedicine).  Patients are able to view lab/test results, encounter notes, upcoming appointments, etc.  Non-urgent messages can be sent to your provider as well.   To learn more about what you can do with MyChart, go to [email or web address].
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
## 2024-03-10 LAB — BASIC METABOLIC PANEL WITH GFR
BUN/Creatinine Ratio: 12 (ref 9–23)
BUN: 10 mg/dL (ref 6–24)
CO2: 23 mmol/L (ref 20–29)
Calcium: 10 mg/dL (ref 8.7–10.2)
Chloride: 100 mmol/L (ref 96–106)
Creatinine, Ser: 0.85 mg/dL (ref 0.57–1.00)
Glucose: 98 mg/dL (ref 70–99)
Potassium: 4.6 mmol/L (ref 3.5–5.2)
Sodium: 138 mmol/L (ref 134–144)
eGFR: 89 mL/min/1.73 (ref >59)

## 2024-03-20 ENCOUNTER — Other Ambulatory Visit: Payer: Self-pay

## 2024-03-21 ENCOUNTER — Other Ambulatory Visit: Payer: Self-pay

## 2024-03-22 DIAGNOSIS — F331 Major depressive disorder, recurrent, moderate: Secondary | ICD-10-CM | POA: Diagnosis not present

## 2024-03-23 ENCOUNTER — Other Ambulatory Visit: Payer: Self-pay

## 2024-03-26 ENCOUNTER — Other Ambulatory Visit: Payer: Self-pay | Admitting: Certified Nurse Midwife

## 2024-03-26 ENCOUNTER — Other Ambulatory Visit: Payer: Self-pay

## 2024-03-26 MED ORDER — LORAZEPAM 0.5 MG PO TABS
0.5000 mg | ORAL_TABLET | Freq: Once | ORAL | 0 refills | Status: AC | PRN
  Filled 2024-03-26: qty 2, 2d supply, fill #0

## 2024-03-26 MED ORDER — IBUPROFEN 600 MG PO TABS
600.0000 mg | ORAL_TABLET | Freq: Four times a day (QID) | ORAL | 0 refills | Status: AC | PRN
  Filled 2024-03-26: qty 60, 15d supply, fill #0
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
## 2024-03-26 NOTE — Progress Notes (Signed)
 Ativan  & ibuprofen  for pre-procedure IUD removal & replacement ordered.

## 2024-03-27 ENCOUNTER — Encounter (HOSPITAL_COMMUNITY): Payer: Self-pay

## 2024-03-27 ENCOUNTER — Other Ambulatory Visit: Payer: Self-pay

## 2024-03-28 ENCOUNTER — Other Ambulatory Visit: Payer: Self-pay

## 2024-03-28 ENCOUNTER — Telehealth: Payer: Self-pay | Admitting: Cardiology

## 2024-03-28 MED ORDER — METOPROLOL TARTRATE 100 MG PO TABS: ORAL_TABLET | ORAL | 0 refills | Status: AC

## 2024-03-28 NOTE — Telephone Encounter (Signed)
*  STAT* If patient is at the pharmacy, call can be transferred to refill team.   1. Which medications need to be refilled? (please list name of each medication and dose if known) metoprolol tartrate (LOPRESSOR) 100 MG tablet   2. Which pharmacy/location (including street and city if local pharmacy) is medication to be sent to?  [id number] - [place], [place] - [address]     3. Do they need a 30 day or 90 day supply? 1 tablet to take it 2 hrs prior to cardiac procedure tomorrow    Pt will to take this tomorrow morning before procedure.

## 2024-03-28 NOTE — Telephone Encounter (Signed)
 Rx sent to pharmacy

## 2024-03-29 ENCOUNTER — Ambulatory Visit: Payer: Self-pay | Admitting: Cardiology

## 2024-03-29 ENCOUNTER — Other Ambulatory Visit: Payer: Self-pay

## 2024-03-29 ENCOUNTER — Telehealth (HOSPITAL_COMMUNITY): Admitting: Psychiatry

## 2024-03-29 ENCOUNTER — Ambulatory Visit
Admission: RE | Admit: 2024-03-29 | Discharge: 2024-03-29 | Disposition: A | Discharge status: Home / Self Care | Source: Ambulatory Visit | Attending: Cardiology | Admitting: Cardiology

## 2024-03-29 ENCOUNTER — Encounter (HOSPITAL_COMMUNITY): Payer: Self-pay | Admitting: Psychiatry

## 2024-03-29 DIAGNOSIS — F609 Personality disorder, unspecified: Secondary | ICD-10-CM

## 2024-03-29 DIAGNOSIS — F902 Attention-deficit hyperactivity disorder, combined type: Secondary | ICD-10-CM | POA: Diagnosis not present

## 2024-03-29 DIAGNOSIS — I209 Angina pectoris, unspecified: Secondary | ICD-10-CM | POA: Insufficient documentation

## 2024-03-29 DIAGNOSIS — F319 Bipolar disorder, unspecified: Secondary | ICD-10-CM

## 2024-03-29 MED ORDER — AMPHETAMINE-DEXTROAMPHET ER 15 MG PO CP24: 15.0000 mg | ORAL_CAPSULE | Freq: Every day | ORAL | 0 refills | Status: AC

## 2024-03-29 MED ORDER — ARIPIPRAZOLE 5 MG PO TABS
2.5000 mg | ORAL_TABLET | Freq: Every day | ORAL | 0 refills | Status: DC
  Filled 2024-03-29 – 2024-05-07 (×2): qty 45, 90d supply, fill #0

## 2024-03-29 MED ORDER — NITROGLYCERIN 0.4 MG SL SUBL
0.8000 mg | SUBLINGUAL_TABLET | Freq: Once | SUBLINGUAL | Status: AC
  Administered 2024-03-29: 0.8 mg via SUBLINGUAL

## 2024-03-29 MED ORDER — AMPHETAMINE-DEXTROAMPHET ER 15 MG PO CP24
15.0000 mg | ORAL_CAPSULE | Freq: Every day | ORAL | 0 refills | Status: DC
  Filled 2024-05-23: qty 30, 30d supply, fill #0

## 2024-03-29 MED ORDER — DILTIAZEM HCL 25 MG/5ML IV SOLN: 10.0000 mg | INTRAVENOUS | Status: DC | PRN

## 2024-03-29 MED ORDER — IOHEXOL 350 MG/ML SOLN
100.0000 mL | Freq: Once | INTRAVENOUS | Status: AC | PRN
  Administered 2024-03-29: 100 mL via INTRAVENOUS

## 2024-03-29 MED ORDER — METOPROLOL TARTRATE 5 MG/5ML IV SOLN
10.0000 mg | Freq: Once | INTRAVENOUS | Status: DC | PRN
Start: 2024-03-29 — End: 2024-03-30

## 2024-03-29 MED ORDER — ARIPIPRAZOLE 10 MG PO TABS
10.0000 mg | ORAL_TABLET | Freq: Every day | ORAL | 0 refills | Status: DC
  Filled 2024-03-29 – 2024-05-07 (×2): qty 90, 90d supply, fill #0

## 2024-03-29 MED ORDER — AMPHETAMINE-DEXTROAMPHET ER 15 MG PO CP24
15.0000 mg | ORAL_CAPSULE | Freq: Every day | ORAL | 0 refills | Status: DC
  Filled 2024-03-29 – 2024-04-16 (×2): qty 30, 30d supply, fill #0

## 2024-03-29 MED ORDER — LAMOTRIGINE 100 MG PO TABS
100.0000 mg | ORAL_TABLET | Freq: Two times a day (BID) | ORAL | 0 refills | Status: DC
  Filled 2024-04-16: qty 180, 90d supply, fill #0

## 2024-04-05 NOTE — Progress Notes (Signed)
     GYNECOLOGY OFFICE PROCEDURE NOTE  MARYN FREELOVE is a 38 y.o. G1P1001 here for Mirena  IUD removal and reinsertion. No GYN concerns.  Last pap smear was on 06/05/21 and was normal.  IUD Removal and Reinsertion  Patient identified, informed consent performed, consent signed.   Discussed risks of irregular bleeding, cramping, infection, malpositioning or misplacement of the IUD outside the uterus which may require further procedures. Also discussed >99% contraception efficacy, increased risk of ectopic pregnancy with failure of method.   Emphasized that this did not protect against STIs, condoms recommended during all sexual encounters.Advised to use backup contraception for one week as the risk of pregnancy is higher during the transition period of removing an IUD and replacing it with another one. Time out was performed. Speculum placed in the vagina. The strings of the IUD were grasped and pulled using ring forceps. The IUD was successfully removed in its entirety. The cervix was cleaned with Betadine x 2 and grasped anteriorly with an Allis clamp. The uterus was sounded to 8.5 cm;  the Mirena  IUD was then placed per manufacturer's recommendations. Strings trimmed to 3 cm.  Allis clamp was removed, good hemostasis noted. Patient tolerated procedure well.   Patient was given post-procedure instructions.  Patient was also asked to check IUD strings periodically and follow up as needed with any IUD related concerns.   Harlene LITTIE Cisco, CNM

## 2024-04-06 ENCOUNTER — Encounter: Payer: Self-pay | Admitting: Certified Nurse Midwife

## 2024-04-06 ENCOUNTER — Ambulatory Visit (INDEPENDENT_AMBULATORY_CARE_PROVIDER_SITE_OTHER): Admitting: Certified Nurse Midwife

## 2024-04-06 VITALS — BP 134/87 | HR 93 | Ht 66.0 in | Wt 187.0 lb

## 2024-04-06 DIAGNOSIS — Z30433 Encounter for removal and reinsertion of intrauterine contraceptive device: Secondary | ICD-10-CM | POA: Diagnosis not present

## 2024-04-06 MED ORDER — LEVONORGESTREL 20 MCG/DAY IU IUD
1.0000 | INTRAUTERINE_SYSTEM | Freq: Once | INTRAUTERINE | Status: AC
  Administered 2024-04-06: 1 via INTRAUTERINE

## 2024-04-06 NOTE — Patient Instructions (Signed)
 Levonorgestrel Intrauterine Device (IUD) What is this medication? LEVONORGESTREL (LEE voe nor jes trel) prevents ovulation and pregnancy. It may also be used to treat heavy periods. It belongs to a group of medications called contraceptives. This medication is a progestin hormone. This medicine may be used for other purposes; ask your health care provider or pharmacist if you have questions. COMMON BRAND NAME(S): Cameron Ali What should I tell my care team before I take this medication? They need to know if you have any of these conditions: Abnormal Pap smear Cancer of the breast, uterus, or cervix Diabetes Endometritis Genital or pelvic infection now or in the past Have more than one sexual partner or your partner has more than one partner Heart disease History of an ectopic or tubal pregnancy Immune system problems IUD in place Liver disease or tumor Problems with blood clots or take blood-thinners Seizures Use intravenous drugs Uterus of unusual shape Vaginal bleeding that has not been explained An unusual or allergic reaction to levonorgestrel, other hormones, silicone, or polyethylene, medications, foods, dyes, or preservatives Pregnant or trying to get pregnant Breast-feeding How should I use this medication? This device is placed inside the uterus by your care team. A patient package insert for the product will be given each time it is inserted. Be sure to read this information carefully each time. The sheet may change often. Talk to your care team about use of this medication in children. Special care may be needed. Overdosage: If you think you have taken too much of this medicine contact a poison control center or emergency room at once. NOTE: This medicine is only for you. Do not share this medicine with others. What if I miss a dose? This does not apply. Depending on the brand of device you have inserted, the device will need to be replaced every 3 to 8  years if you wish to continue using this type of birth control. What may interact with this medication? Interactions are not expected. Tell your care team about all the medications you take. This list may not describe all possible interactions. Give your health care provider a list of all the medicines, herbs, non-prescription drugs, or dietary supplements you use. Also tell them if you smoke, drink alcohol, or use illegal drugs. Some items may interact with your medicine. What should I watch for while using this medication? Visit your care team for regular check-ups. Tell your care team if you or your partner becomes HIV positive or gets a sexually transmitted disease. Using this medication does not protect you or your partner against HIV or other sexually transmitted infections (STIs). You can check the placement of the IUD yourself by reaching up to the top of your vagina with clean fingers to feel the threads. Do not pull on the threads. It is a good habit to check placement after each menstrual period. Call your care team right away if you feel more of the IUD than just the threads or if you cannot feel the threads at all. The IUD may come out by itself. You may become pregnant if the device comes out. If you notice that the IUD has come out use a backup birth control method like condoms and call your care team. Using tampons will not change the position of the IUD and are okay to use during your period. This IUD can be safely scanned with magnetic resonance imaging (MRI) only under specific conditions. Before you have an MRI, tell your care team  that you have an IUD in place, and which type of IUD you have in place. What side effects may I notice from receiving this medication? Side effects that you should report to your care team as soon as possible: Allergic reactions--skin rash, itching, hives, swelling of the face, lips, tongue, or throat Blood clot--pain, swelling, or warmth in the leg,  shortness of breath, chest pain Gallbladder problems--severe stomach pain, nausea, vomiting, fever Increase in blood pressure Liver injury--right upper belly pain, loss of appetite, nausea, light-colored stool, dark yellow or brown urine, yellowing skin or eyes, unusual weakness or fatigue New or worsening migraines or headaches Pelvic inflammatory disease (PID)--fever, abdominal pain, pelvic pain, pain or trouble passing urine, spotting, bleeding during or after sex Stroke--sudden numbness or weakness of the face, arm, or leg, trouble speaking, confusion, trouble walking, loss of balance or coordination, dizziness, severe headache, change in vision Unusual vaginal discharge, itching, or odor Vaginal pain, irritation, or sores Worsening mood, feelings of depression Side effects that usually do not require medical attention (report to your care team if they continue or are bothersome): Breast pain or tenderness Dark patches of skin on the face or other sun-exposed areas Irregular menstrual cycles or spotting Nausea Weight gain This list may not describe all possible side effects. Call your doctor for medical advice about side effects. You may report side effects to FDA at 1-800-FDA-1088. Where should I keep my medication? This does not apply. NOTE: This sheet is a summary. It may not cover all possible information. If you have questions about this medicine, talk to your doctor, pharmacist, or health care provider.  2024 [employer] (2021-01-14 00:00:00)

## 2024-04-09 ENCOUNTER — Other Ambulatory Visit: Payer: Self-pay

## 2024-04-16 ENCOUNTER — Other Ambulatory Visit: Payer: Self-pay

## 2024-04-19 DIAGNOSIS — F331 Major depressive disorder, recurrent, moderate: Secondary | ICD-10-CM | POA: Diagnosis not present

## 2024-05-04 ENCOUNTER — Ambulatory Visit: Attending: Cardiology

## 2024-05-04 DIAGNOSIS — I10 Essential (primary) hypertension: Secondary | ICD-10-CM

## 2024-05-04 DIAGNOSIS — I209 Angina pectoris, unspecified: Secondary | ICD-10-CM | POA: Diagnosis not present

## 2024-05-04 LAB — ECHOCARDIOGRAM COMPLETE
AR max vel: 2.64 cm2
AV Area VTI: 2.73 cm2
AV Area mean vel: 2.65 cm2
AV Mean grad: 3 mmHg
AV Peak grad: 6.3 mmHg
Ao pk vel: 1.25 m/s
Area-P 1/2: 4.71 cm2
S' Lateral: 2.4 cm

## 2024-05-04 MED ORDER — PERFLUTREN LIPID MICROSPHERE
1.0000 mL | INTRAVENOUS | Status: AC | PRN
  Administered 2024-05-04: 1 mL via INTRAVENOUS

## 2024-05-07 ENCOUNTER — Other Ambulatory Visit: Payer: Self-pay

## 2024-05-23 ENCOUNTER — Other Ambulatory Visit: Payer: Self-pay

## 2024-05-29 ENCOUNTER — Other Ambulatory Visit

## 2024-05-30 LAB — COMPREHENSIVE METABOLIC PANEL WITH GFR
ALT: 35 IU/L — ABNORMAL HIGH (ref 0–32)
AST: 27 IU/L (ref 0–40)
Albumin: 4.6 g/dL (ref 3.9–4.9)
Alkaline Phosphatase: 63 IU/L (ref 41–116)
BUN/Creatinine Ratio: 11 (ref 9–23)
BUN: 9 mg/dL (ref 6–24)
Bilirubin Total: 0.6 mg/dL (ref 0.0–1.2)
CO2: 22 mmol/L (ref 20–29)
Calcium: 9.5 mg/dL (ref 8.7–10.2)
Chloride: 97 mmol/L (ref 96–106)
Creatinine, Ser: 0.85 mg/dL (ref 0.57–1.00)
Globulin, Total: 2.1 g/dL (ref 1.5–4.5)
Glucose: 98 mg/dL (ref 70–99)
Potassium: 4.4 mmol/L (ref 3.5–5.2)
Sodium: 135 mmol/L (ref 134–144)
Total Protein: 6.7 g/dL (ref 6.0–8.5)
eGFR: 89 mL/min/1.73

## 2024-05-30 LAB — VITAMIN D 25 HYDROXY (VIT D DEFICIENCY, FRACTURES): Vit D, 25-Hydroxy: 29.5 ng/mL — ABNORMAL LOW (ref 30.0–100.0)

## 2024-05-30 LAB — LIPID PANEL
Chol/HDL Ratio: 2.9 ratio (ref 0.0–4.4)
Cholesterol, Total: 198 mg/dL (ref 100–199)
HDL: 69 mg/dL
LDL Chol Calc (NIH): 113 mg/dL — ABNORMAL HIGH (ref 0–99)
Triglycerides: 87 mg/dL (ref 0–149)
VLDL Cholesterol Cal: 16 mg/dL (ref 5–40)

## 2024-05-30 LAB — TSH RFX ON ABNORMAL TO FREE T4: TSH: 1.98 u[IU]/mL (ref 0.450–4.500)

## 2024-05-30 LAB — VITAMIN B12: Vitamin B-12: 636 pg/mL (ref 232–1245)

## 2024-06-04 ENCOUNTER — Other Ambulatory Visit: Payer: Self-pay

## 2024-06-04 ENCOUNTER — Other Ambulatory Visit: Payer: Self-pay | Admitting: Family Medicine

## 2024-06-04 DIAGNOSIS — I10 Essential (primary) hypertension: Secondary | ICD-10-CM

## 2024-06-04 MED ORDER — LOSARTAN POTASSIUM 50 MG PO TABS
50.0000 mg | ORAL_TABLET | Freq: Every day | ORAL | 1 refills | Status: AC
  Filled 2024-06-04 – 2024-06-22 (×2): qty 90, 90d supply, fill #0
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
## 2024-06-07 ENCOUNTER — Other Ambulatory Visit: Payer: Self-pay

## 2024-06-07 ENCOUNTER — Encounter: Payer: Self-pay | Admitting: Pharmacist

## 2024-06-07 ENCOUNTER — Encounter (HOSPITAL_COMMUNITY): Payer: Self-pay | Admitting: Psychiatry

## 2024-06-07 ENCOUNTER — Telehealth (HOSPITAL_COMMUNITY): Admitting: Psychiatry

## 2024-06-07 DIAGNOSIS — F902 Attention-deficit hyperactivity disorder, combined type: Secondary | ICD-10-CM | POA: Diagnosis not present

## 2024-06-07 DIAGNOSIS — F319 Bipolar disorder, unspecified: Secondary | ICD-10-CM

## 2024-06-07 DIAGNOSIS — F609 Personality disorder, unspecified: Secondary | ICD-10-CM

## 2024-06-07 MED ORDER — ARIPIPRAZOLE 10 MG PO TABS
10.0000 mg | ORAL_TABLET | Freq: Every day | ORAL | 0 refills | Status: AC
  Filled 2024-06-07: qty 90, 90d supply, fill #0

## 2024-06-07 MED ORDER — AMPHETAMINE-DEXTROAMPHET ER 15 MG PO CP24: 15.0000 mg | ORAL_CAPSULE | Freq: Every day | ORAL | 0 refills | Status: AC

## 2024-06-07 MED ORDER — LAMOTRIGINE 100 MG PO TABS
100.0000 mg | ORAL_TABLET | Freq: Two times a day (BID) | ORAL | 0 refills | Status: AC
  Filled 2024-06-07: qty 360, 180d supply, fill #0

## 2024-06-07 MED ORDER — ARIPIPRAZOLE 5 MG PO TABS
2.5000 mg | ORAL_TABLET | Freq: Every day | ORAL | 0 refills | Status: AC
  Filled 2024-06-07: qty 45, 90d supply, fill #0

## 2024-06-12 ENCOUNTER — Ambulatory Visit: Admitting: Cardiology

## 2024-06-13 ENCOUNTER — Ambulatory Visit: Payer: Self-pay | Admitting: Family Medicine
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
## 2024-06-14 ENCOUNTER — Encounter: Payer: Self-pay | Admitting: Certified Nurse Midwife

## 2024-06-14 ENCOUNTER — Ambulatory Visit: Admitting: Certified Nurse Midwife

## 2024-06-14 VITALS — BP 137/82 | HR 94 | Ht 66.0 in | Wt 191.9 lb

## 2024-06-14 DIAGNOSIS — Z30431 Encounter for routine checking of intrauterine contraceptive device: Secondary | ICD-10-CM

## 2024-06-14 DIAGNOSIS — Z01419 Encounter for gynecological examination (general) (routine) without abnormal findings: Secondary | ICD-10-CM

## 2024-06-14 DIAGNOSIS — Z1231 Encounter for screening mammogram for malignant neoplasm of breast: Secondary | ICD-10-CM

## 2024-06-14 NOTE — Patient Instructions (Addendum)
 Preventive Care 74-41 Years Old, Female Preventive care refers to lifestyle choices and visits with your health care provider that can promote health and wellness. Preventive care visits are also called wellness exams. What can I expect for my preventive care visit? Counseling Your health care provider may ask you questions about your: Medical history, including: Past medical problems. Family medical history. Pregnancy history. Current health, including: Menstrual cycle. Method of birth control. Emotional well-being. Home life and relationship well-being. Sexual activity and sexual health. Lifestyle, including: Alcohol, nicotine  or tobacco, and drug use. Access to firearms. Diet, exercise, and sleep habits. Work and work astronomer. Sunscreen use. Safety issues such as seatbelt and bike helmet use. Physical exam Your health care provider will check your: Height and weight. These may be used to calculate your BMI (body mass index). BMI is a measurement that tells if you are at a healthy weight. Waist circumference. This measures the distance around your waistline. This measurement also tells if you are at a healthy weight and may help predict your risk of certain diseases, such as type 2 diabetes and high blood pressure. Heart rate and blood pressure. Body temperature. Skin for abnormal spots. What immunizations do I need?  Vaccines are usually given at various ages, according to a schedule. Your health care provider will recommend vaccines for you based on your age, medical history, and lifestyle or other factors, such as travel or where you work. What tests do I need? Screening Your health care provider may recommend screening tests for certain conditions. This may include: Lipid and cholesterol levels. Diabetes screening. This is done by checking your blood sugar (glucose) after you have not eaten for a while (fasting). Pelvic exam and Pap test. Hepatitis B test. Hepatitis C  test. HIV (human immunodeficiency virus) test. STI (sexually transmitted infection) testing, if you are at risk. Lung cancer screening. Colorectal cancer screening. Mammogram. Talk with your health care provider about when you should start having regular mammograms. This may depend on whether you have a family history of breast cancer. BRCA-related cancer screening. This may be done if you have a family history of breast, ovarian, tubal, or peritoneal cancers. Bone density scan. This is done to screen for osteoporosis. Talk with your health care provider about your test results, treatment options, and if necessary, the need for more tests. Follow these instructions at home: Eating and drinking  Eat a diet that includes fresh fruits and vegetables, whole grains, lean protein, and low-fat dairy products. Take vitamin and mineral supplements as recommended by your health care provider. Do not drink alcohol if: Your health care provider tells you not to drink. You are pregnant, may be pregnant, or are planning to become pregnant. If you drink alcohol: Limit how much you have to 0-1 drink a day. Know how much alcohol is in your drink. In the [place] one drink equals one 12 oz bottle of beer (355 mL), one 5 oz glass of wine (148 mL), or one 1 oz glass of hard liquor (44 mL). Lifestyle Brush your teeth every morning and night with fluoride toothpaste. Floss one time each day. Exercise for at least 30 minutes 5 or more days each week. Do not use any products that contain nicotine  or tobacco. These products include cigarettes, chewing tobacco, and vaping devices, such as e-cigarettes. If you need help quitting, ask your health care provider. Do not use drugs. If you are sexually active, practice safe sex. Use a condom or other form of protection to  prevent STIs. If you do not wish to become pregnant, use a form of birth control. If you plan to become pregnant, see your health care provider for a  prepregnancy visit. Take aspirin  only as told by your health care provider. Make sure that you understand how much to take and what form to take. Work with your health care provider to find out whether it is safe and beneficial for you to take aspirin  daily. Find healthy ways to manage stress, such as: Meditation, yoga, or listening to music. Journaling. Talking to a trusted person. Spending time with friends and family. Minimize exposure to UV radiation to reduce your risk of skin cancer. Safety Always wear your seat belt while driving or riding in a vehicle. Do not drive: If you have been drinking alcohol. Do not ride with someone who has been drinking. When you are tired or distracted. While texting. If you have been using any mind-altering substances or drugs. Wear a helmet and other protective equipment during sports activities. If you have firearms in your house, make sure you follow all gun safety procedures. Seek help if you have been physically or sexually abused. What's next? Visit your health care provider once a year for an annual wellness visit. Ask your health care provider how often you should have your eyes and teeth checked. Stay up to date on all vaccines. This information is not intended to replace advice given to you by your health care provider. Make sure you discuss any questions you have with your health care provider. Document Revised: 10/29/2020 Document Reviewed: 10/29/2020 Elsevier Patient Education  2024 [employer]. Preventive Care 69-35 Years Old, Female Preventive care refers to lifestyle choices and visits with your health care provider that can promote health and wellness. Preventive care visits are also called wellness exams. What can I expect for my preventive care visit? Counseling During your preventive care visit, your health care provider may ask about your: Medical history, including: Past medical problems. Family medical history. Pregnancy  history. Current health, including: Menstrual cycle. Method of birth control. Emotional well-being. Home life and relationship well-being. Sexual activity and sexual health. Lifestyle, including: Alcohol, nicotine  or tobacco, and drug use. Access to firearms. Diet, exercise, and sleep habits. Work and work astronomer. Sunscreen use. Safety issues such as seatbelt and bike helmet use. Physical exam Your health care provider may check your: Height and weight. These may be used to calculate your BMI (body mass index). BMI is a measurement that tells if you are at a healthy weight. Waist circumference. This measures the distance around your waistline. This measurement also tells if you are at a healthy weight and may help predict your risk of certain diseases, such as type 2 diabetes and high blood pressure. Heart rate and blood pressure. Body temperature. Skin for abnormal spots. What immunizations do I need?  Vaccines are usually given at various ages, according to a schedule. Your health care provider will recommend vaccines for you based on your age, medical history, and lifestyle or other factors, such as travel or where you work. What tests do I need? Screening Your health care provider may recommend screening tests for certain conditions. This may include: Pelvic exam and Pap test. Lipid and cholesterol levels. Diabetes screening. This is done by checking your blood sugar (glucose) after you have not eaten for a while (fasting). Hepatitis B test. Hepatitis C test. HIV (human immunodeficiency virus) test. STI (sexually transmitted infection) testing, if you are at risk. BRCA-related cancer  screening. This may be done if you have a family history of breast, ovarian, tubal, or peritoneal cancers. Talk with your health care provider about your test results, treatment options, and if necessary, the need for more tests. Follow these instructions at home: Eating and drinking  Eat  a healthy diet that includes fresh fruits and vegetables, whole grains, lean protein, and low-fat dairy products. Take vitamin and mineral supplements as recommended by your health care provider. Do not drink alcohol if: Your health care provider tells you not to drink. You are pregnant, may be pregnant, or are planning to become pregnant. If you drink alcohol: Limit how much you have to 0-1 drink a day. Know how much alcohol is in your drink. In the [place] one drink equals one 12 oz bottle of beer (355 mL), one 5 oz glass of wine (148 mL), or one 1 oz glass of hard liquor (44 mL). Lifestyle Brush your teeth every morning and night with fluoride toothpaste. Floss one time each day. Exercise for at least 30 minutes 5 or more days each week. Do not use any products that contain nicotine  or tobacco. These products include cigarettes, chewing tobacco, and vaping devices, such as e-cigarettes. If you need help quitting, ask your health care provider. Do not use drugs. If you are sexually active, practice safe sex. Use a condom or other form of protection to prevent STIs. If you do not wish to become pregnant, use a form of birth control. If you plan to become pregnant, see your health care provider for a prepregnancy visit. Find healthy ways to manage stress, such as: Meditation, yoga, or listening to music. Journaling. Talking to a trusted person. Spending time with friends and family. Minimize exposure to UV radiation to reduce your risk of skin cancer. Safety Always wear your seat belt while driving or riding in a vehicle. Do not drive: If you have been drinking alcohol. Do not ride with someone who has been drinking. If you have been using any mind-altering substances or drugs. While texting. When you are tired or distracted. Wear a helmet and other protective equipment during sports activities. If you have firearms in your house, make sure you follow all gun safety procedures. Seek help  if you have been physically or sexually abused. What's next? Go to your health care provider once a year for an annual wellness visit. Ask your health care provider how often you should have your eyes and teeth checked. Stay up to date on all vaccines. This information is not intended to replace advice given to you by your health care provider. Make sure you discuss any questions you have with your health care provider. Document Revised: 10/29/2020 Document Reviewed: 10/29/2020 Elsevier Patient Education  2024 [employer].

## 2024-06-22 ENCOUNTER — Other Ambulatory Visit: Payer: Self-pay | Admitting: Certified Nurse Midwife

## 2024-06-22 ENCOUNTER — Other Ambulatory Visit: Payer: Self-pay

## 2024-06-22 DIAGNOSIS — R058 Other specified cough: Secondary | ICD-10-CM

## 2024-06-22 MED ORDER — BENZONATATE 100 MG PO CAPS
100.0000 mg | ORAL_CAPSULE | Freq: Three times a day (TID) | ORAL | 0 refills | Status: AC | PRN
  Filled 2024-06-22: qty 20, 7d supply, fill #0

## 2024-08-23 ENCOUNTER — Telehealth (HOSPITAL_COMMUNITY): Admitting: Psychiatry

## 2024-09-13 ENCOUNTER — Other Ambulatory Visit

## 2024-09-13 ENCOUNTER — Encounter
